# Patient Record
Sex: Female | Born: 1955 | Race: White | Hispanic: No | Marital: Married | Smoking: Current every day smoker
Health system: Southern US, Community
[De-identification: ages and names within clinical notes are randomized; demographics above are authoritative.]

## PROBLEM LIST (undated history)

## (undated) DIAGNOSIS — E785 Hyperlipidemia, unspecified: Secondary | ICD-10-CM

## (undated) DIAGNOSIS — M1990 Unspecified osteoarthritis, unspecified site: Secondary | ICD-10-CM

## (undated) DIAGNOSIS — I700 Atherosclerosis of aorta: Secondary | ICD-10-CM

## (undated) DIAGNOSIS — K589 Irritable bowel syndrome without diarrhea: Secondary | ICD-10-CM

## (undated) DIAGNOSIS — F419 Anxiety disorder, unspecified: Secondary | ICD-10-CM

## (undated) DIAGNOSIS — I10 Essential (primary) hypertension: Secondary | ICD-10-CM

## (undated) HISTORY — DX: Essential (primary) hypertension: I10

## (undated) HISTORY — DX: Atherosclerosis of aorta: I70.0

## (undated) HISTORY — DX: Irritable bowel syndrome, unspecified: K58.9

## (undated) HISTORY — PX: ROTATOR CUFF REPAIR: SHX139

## (undated) HISTORY — DX: Hyperlipidemia, unspecified: E78.5

## (undated) HISTORY — DX: Anxiety disorder, unspecified: F41.9

## (undated) HISTORY — DX: Unspecified osteoarthritis, unspecified site: M19.90

---

## 1998-07-26 ENCOUNTER — Emergency Department (HOSPITAL_COMMUNITY): Admission: EM | Admit: 1998-07-26 | Discharge: 1998-07-26 | Payer: Self-pay | Admitting: Emergency Medicine

## 2000-03-31 ENCOUNTER — Other Ambulatory Visit: Admission: RE | Admit: 2000-03-31 | Discharge: 2000-03-31 | Payer: Self-pay | Admitting: *Deleted

## 2001-06-22 ENCOUNTER — Other Ambulatory Visit: Admission: RE | Admit: 2001-06-22 | Discharge: 2001-06-22 | Payer: Self-pay | Admitting: Family Medicine

## 2003-02-04 HISTORY — PX: COLONOSCOPY WITH ESOPHAGOGASTRODUODENOSCOPY (EGD): SHX5779

## 2003-02-12 ENCOUNTER — Ambulatory Visit (HOSPITAL_COMMUNITY): Admission: RE | Admit: 2003-02-12 | Discharge: 2003-02-12 | Payer: Self-pay | Admitting: Gastroenterology

## 2004-11-16 ENCOUNTER — Other Ambulatory Visit: Admission: RE | Admit: 2004-11-16 | Discharge: 2004-11-16 | Payer: Self-pay | Admitting: Family Medicine

## 2010-07-04 ENCOUNTER — Emergency Department (HOSPITAL_COMMUNITY): Admission: EM | Admit: 2010-07-04 | Discharge: 2010-07-04 | Payer: Self-pay | Admitting: Family Medicine

## 2010-12-06 HISTORY — PX: REPLACEMENT TOTAL KNEE: SUR1224

## 2011-04-23 NOTE — Op Note (Signed)
Sherry Salinas, Sherry Salinas                         ACCOUNT NO.:  [id number]   MEDICAL RECORD NO.:  [id number]                   PATIENT TYPE:  AMB   LOCATION:  ENDO                                 FACILITY:  [facility]   PHYSICIAN:  John C. Madilyn Fireman, M.D.                 DATE OF BIRTH:  05/06/1956   DATE OF PROCEDURE:  02/12/2003  DATE OF DISCHARGE:                                 OPERATIVE REPORT   PROCEDURE:  Esophagogastroduodenoscopy.   INDICATIONS FOR PROCEDURE:  Patient undergoing colonoscopy for heme positive  stools. She has also had a long history of gastroesophageal reflux and uses  nonsteroidal antiinflammatory drugs and we decided to perform an EGD as  well.   DESCRIPTION OF PROCEDURE:  The patient was placed in the left lateral  decubitus position then placed on the pulse monitor with continuous low flow  oxygen delivered by nasal cannula. She was sedated with 75 mcg IV fentanyl  and 6 mg IV Versed. The Olympus video endoscope was advanced under direct  vision into the oropharynx and esophagus. The esophagus was straight and of  normal caliber with the squamocolumnar line at approximately 38 cm. There  were 2 or 3 long erosions without exudate extending up about 2 cm from the  main level of the Z line consistent with esophagitis possibly and evolution  to short segment type of Barrett's esophagus. There were no visible erosions  or exudate and no visible ring or stricture. There did appear to be a  vaguely defined 1 1/2 to 2 cm sliding hiatal hernia and a patulous  gastroesophageal junction. The stomach was entered and a small amount of  liquid secretions were suctioned from the fundus. Retroflexed view of the  cardia confirmed a hiatal hernia and was otherwise unremarkable. The fundus,  body, antrum and pylorus appeared normal. The duodenum was entered and there  was some mild erythema and granularity of the duodenal bulb consistent with  duodenitis. The postbulbar duodenum  appeared normal. A CLOtest was obtained  from the antrum and the scope was withdrawn. The patient returned to the  recovery room in stable condition. The patient tolerated the procedure well  and there were no immediate complications.   IMPRESSION:  1. Grade 3 esophagitis.  2. Hiatal hernia with patulous gastroesophageal junction.  3. Mild duodenitis.   PLAN:  Will suggest a proton pump inhibitor in place of Pepcid if her  symptoms are not adequately controlled and will await CLOtest and treat for  eradication of helicobacter if positive.                                               John C. Madilyn Fireman, M.D.   JCH/MEDQ  D:  02/12/2003  T:  02/12/2003  Job:  [id number]  cc:   S. Kyra Manges, M.D.  [phone] [address]  [place]  [place] [address]  Fax: [phone]

## 2011-04-23 NOTE — Op Note (Signed)
   Sherry Salinas, Sherry Salinas                         ACCOUNT NO.:  [id number]   MEDICAL RECORD NO.:  [id number]                   PATIENT TYPE:  AMB   LOCATION:  ENDO                                 FACILITY:  [facility]   PHYSICIAN:  John C. Madilyn Fireman, M.D.                 DATE OF BIRTH:  25-Jul-1956   DATE OF PROCEDURE:  02/12/2003  DATE OF DISCHARGE:                                 OPERATIVE REPORT   PROCEDURE:  Colonoscopy.   INDICATION FOR PROCEDURE:  Heme-positive stools.   DESCRIPTION OF PROCEDURE:  The patient was placed in the left lateral  decubitus position and placed on the pulse monitor with continuous low-flow  oxygen delivered by nasal cannula.  She was sedated with 25 mcg IV fentanyl  and 4 mg IV Versed in addition to the medicine given for the previous EGD.  The Olympus video colonoscope was inserted into the rectum and advanced to  the cecum, confirmed by transillumination at McBurney's point and  visualization of the ileocecal valve and appendiceal orifice.  The prep was  excellent.  The cecum, ascending, transverse, descending, and sigmoid colon  all appeared normal with no masses, polyps, diverticula, or other mucosal  abnormalities.  The rectum likewise appeared normal, and retroflexed view of  the anus revealed no obvious internal hemorrhoids.  The colonoscope was then  withdrawn, and the patient returned to the recovery room in stable  condition.  She tolerated the procedure well, and there were no immediate  complications.   IMPRESSION:  Basically normal colonoscopy.   PLAN:  Next colon screening by sigmoidoscopy in five years.                                               John C. Madilyn Fireman, M.D.    JCH/MEDQ  D:  02/12/2003  T:  02/12/2003  Job:  [id number]   cc:   S. Kyra Manges, M.D.  [phone] [address]  [place]  [place] [address]  Fax: [phone]

## 2011-07-23 ENCOUNTER — Other Ambulatory Visit (HOSPITAL_COMMUNITY): Payer: Self-pay | Admitting: Orthopedic Surgery

## 2011-07-23 ENCOUNTER — Ambulatory Visit (HOSPITAL_COMMUNITY)
Admission: RE | Admit: 2011-07-23 | Discharge: 2011-07-23 | Disposition: A | Discharge status: Home / Self Care | Payer: BC Managed Care – PPO | Source: Ambulatory Visit | Attending: Orthopedic Surgery | Admitting: Orthopedic Surgery

## 2011-07-23 ENCOUNTER — Encounter (HOSPITAL_COMMUNITY)
Admission: RE | Admit: 2011-07-23 | Discharge: 2011-07-23 | Disposition: A | Discharge status: Home / Self Care | Payer: BC Managed Care – PPO | Source: Ambulatory Visit | Attending: Orthopedic Surgery | Admitting: Orthopedic Surgery

## 2011-07-23 DIAGNOSIS — Z01812 Encounter for preprocedural laboratory examination: Secondary | ICD-10-CM | POA: Insufficient documentation

## 2011-07-23 DIAGNOSIS — Z01811 Encounter for preprocedural respiratory examination: Secondary | ICD-10-CM

## 2011-07-23 DIAGNOSIS — Z01818 Encounter for other preprocedural examination: Secondary | ICD-10-CM | POA: Insufficient documentation

## 2011-07-23 DIAGNOSIS — Z01810 Encounter for preprocedural cardiovascular examination: Secondary | ICD-10-CM | POA: Insufficient documentation

## 2011-07-23 LAB — COMPREHENSIVE METABOLIC PANEL
ALT: 24 U/L (ref 0–35)
Alkaline Phosphatase: 86 U/L (ref 39–117)
BUN: 17 mg/dL (ref 6–23)
Calcium: 10.5 mg/dL (ref 8.4–10.5)
Potassium: 5.4 mEq/L — ABNORMAL HIGH (ref 3.5–5.1)
Total Bilirubin: 0.4 mg/dL (ref 0.3–1.2)
Total Protein: 7.2 g/dL (ref 6.0–8.3)

## 2011-07-23 LAB — CBC
HCT: 41.7 % (ref 36.0–46.0)
Hemoglobin: 14.4 g/dL (ref 12.0–15.0)
MCHC: 34.5 g/dL (ref 30.0–36.0)
RDW: 12.6 % (ref 11.5–15.5)

## 2011-07-23 LAB — SURGICAL PCR SCREEN: Staphylococcus aureus: NEGATIVE

## 2011-07-23 LAB — PROTIME-INR: INR: 1.07 (ref 0.00–1.49)

## 2011-07-23 LAB — TYPE AND SCREEN: Antibody Screen: NEGATIVE

## 2011-07-23 LAB — APTT: aPTT: 32 seconds (ref 24–37)

## 2011-08-04 ENCOUNTER — Inpatient Hospital Stay (HOSPITAL_COMMUNITY)
Admission: RE | Admit: 2011-08-04 | Discharge: 2011-08-08 | DRG: 209 | Disposition: A | Discharge status: Home / Self Care | Payer: BC Managed Care – PPO | Source: Ambulatory Visit | Attending: Orthopedic Surgery | Admitting: Orthopedic Surgery

## 2011-08-04 ENCOUNTER — Inpatient Hospital Stay (HOSPITAL_COMMUNITY): Payer: BC Managed Care – PPO

## 2011-08-04 DIAGNOSIS — Z8709 Personal history of other diseases of the respiratory system: Secondary | ICD-10-CM

## 2011-08-04 DIAGNOSIS — M1710 Unilateral primary osteoarthritis, unspecified knee: Principal | ICD-10-CM | POA: Diagnosis present

## 2011-08-04 DIAGNOSIS — I10 Essential (primary) hypertension: Secondary | ICD-10-CM | POA: Diagnosis present

## 2011-08-04 DIAGNOSIS — F17200 Nicotine dependence, unspecified, uncomplicated: Secondary | ICD-10-CM | POA: Diagnosis present

## 2011-08-05 LAB — CBC
HCT: 29.6 % — ABNORMAL LOW (ref 36.0–46.0)
Hemoglobin: 10.1 g/dL — ABNORMAL LOW (ref 12.0–15.0)
MCHC: 34.1 g/dL (ref 30.0–36.0)
Platelets: 215 10*3/uL (ref 150–400)
RDW: 13 % (ref 11.5–15.5)

## 2011-08-05 LAB — BASIC METABOLIC PANEL
BUN: 14 mg/dL (ref 6–23)
CO2: 25 mEq/L (ref 19–32)
Calcium: 8.9 mg/dL (ref 8.4–10.5)
Chloride: 107 mEq/L (ref 96–112)
Creatinine, Ser: 0.63 mg/dL (ref 0.50–1.10)
Glucose, Bld: 129 mg/dL — ABNORMAL HIGH (ref 70–99)
Potassium: 4 mEq/L (ref 3.5–5.1)

## 2011-08-05 LAB — PROTIME-INR: INR: 1.18 (ref 0.00–1.49)

## 2011-08-06 LAB — CBC
MCH: 32.4 pg (ref 26.0–34.0)
MCHC: 32.9 g/dL (ref 30.0–36.0)
MCV: 98.4 fL (ref 78.0–100.0)
Platelets: 197 10*3/uL (ref 150–400)
RDW: 13.3 % (ref 11.5–15.5)
WBC: 13.3 10*3/uL — ABNORMAL HIGH (ref 4.0–10.5)

## 2011-08-06 LAB — BASIC METABOLIC PANEL
BUN: 12 mg/dL (ref 6–23)
CO2: 29 mEq/L (ref 19–32)
Calcium: 9 mg/dL (ref 8.4–10.5)
Chloride: 101 mEq/L (ref 96–112)
Creatinine, Ser: 0.55 mg/dL (ref 0.50–1.10)
GFR calc Af Amer: >60 mL/min (ref >60)
Potassium: 4 mEq/L (ref 3.5–5.1)
Sodium: 134 mEq/L — ABNORMAL LOW (ref 135–145)

## 2011-08-07 LAB — CBC
HCT: 29.7 % — ABNORMAL LOW (ref 36.0–46.0)
Hemoglobin: 10 g/dL — ABNORMAL LOW (ref 12.0–15.0)
MCH: 32.8 pg (ref 26.0–34.0)
MCHC: 33.7 g/dL (ref 30.0–36.0)
WBC: 10.6 10*3/uL — ABNORMAL HIGH (ref 4.0–10.5)

## 2011-08-07 LAB — BASIC METABOLIC PANEL
CO2: 27 mEq/L (ref 19–32)
Creatinine, Ser: 0.63 mg/dL (ref 0.50–1.10)
Sodium: 139 mEq/L (ref 135–145)

## 2011-08-08 LAB — PROTIME-INR
INR: 2.28 — ABNORMAL HIGH (ref 0.00–1.49)
Prothrombin Time: 25.5 seconds — ABNORMAL HIGH (ref 11.6–15.2)

## 2011-08-09 NOTE — Op Note (Signed)
NAMEINDRA, WOLTERS               ACCOUNT NO.:  [id number]  MEDICAL RECORD NO.:  [id number]  LOCATION:  2899                         FACILITY:  [facility]  PHYSICIAN:  Nadara Mustard, MD     DATE OF BIRTH:  07/27/1956  DATE OF PROCEDURE:  08/04/2011 DATE OF DISCHARGE:                              OPERATIVE REPORT   PREOPERATIVE DIAGNOSIS:  Osteoarthritis right knee with deep retained hardware.  POSTOPERATIVE DIAGNOSIS:  Osteoarthritis right knee with deep retained hardware.  PROCEDURE: 1. Right total knee arthroplasty with Zimmer components, size 3 tibia,     size D femur, 17-mm rotating platform poly with a 29-mm patella. 2. Removal of deep retained hardware. 3. Computer-assisted guidance for the total knee arthroplasty.  SURGEON:  Nadara Mustard, MD  ANESTHESIA:  General plus femoral block plus popliteal block.  ESTIMATED BLOOD LOSS:  Minimal.  ANTIBIOTICS:  Kefzol 1 g.  DRAINS:  None.  COMPLICATIONS:  None.  DISPOSITION:  To PACU in stable condition.  TOURNIQUET TIME:  38 minutes at 300 mmHg.  INDICATIONS FOR PROCEDURE:  The patient is a 55 year old woman who is status post open right knee reconstruction.  She has developed degenerative traumatic arthritis to the right knee.  She has undergone arthroscopy, steroid injections, and Uflex injections.  She has pain with activities of daily living.  She is unable to perform activities of daily living.  She has to use assistance for ambulation and due to failure of prolonged conservative care she presents at this time for total knee arthroplasty.  Risks and benefits of surgery were discussed including infection, neurovascular injury, persistent pain, DVT, pulmonary embolus and need for additional surgery.  The patient states she understands and wished to proceed at this time.  DESCRIPTION OF PROCEDURE:  The patient was brought to OR room 10 after undergoing a femoral block.  She underwent a general anesthetic.   After adequate level of anesthesia obtained, the patient's right lower extremity was prepped using DuraPrep and draped into a sterile field. Her previous curvilinear incision was used and this was extended proximally and distally.  This was carried down to the medial parapatellar retinacular incision.  Visualization showed tricompartmental arthritic changes with loss of articular cartilage in all three compartments.  An osteotome was used to remove the overlying bone and then the deep retained staple was removed on the proximal medial tibia.  Attention was first focused on the femur.  The femur was reamed with an IM guidance where 10 mm taken off the distal femur, 3 degrees of flexion and 6 degrees of valgus.  The cut was made. Attention was then focused on the tibia.  Approximately, 15 mm was taken off the proximal tibia with neutral varus and valgus with a 7-degree posterior slope.  The computer generated tool was then placed on the femoral condyles.  Sequential trials with the polyethylene thickness was tried and with the knee held flexed in 90 degrees of flexion, this was dialed into the external rotation which allowed for equal pressure in the medial and lateral compartments of three.  This was pinned.  The sizing block was placed.  This sized for a size D.  The pin  holes were made for the size D femur and the chamfer cuts were made for the size D femur with the cutting block.  Attention was then focused on the tibia. The tibia was sized for a size 3 and the keel punches were made for the size 3 tibia.  Attention was then focused on the femur.  The femoral trial was placed and the box cut was made for the femur.  This was tried with a 17-mm poly and had a stable alignment.  The trial components were removed.  The patella was resurfaced and 9-mm was taken off the patella and a 29-mm patella button cuts were made.  The popliteal block was performed with 0.25% Marcaine plain with 60 mL  of Marcaine.  Care was taken not to have an intravascular injection.  The knee was irrigated with pulsatile lavage.  The tibial and femoral components were cemented in place and loose cement was removed.  The poly tray was placed and the knee was kept in extension until the cement had hardened.  The knee screw was placed for the poly.  The patella button was clamped in place and all components were left to mobilize until the cement had hardened. All loose cement was removed.  The knee was irrigated again with pulsatile lavage.  The tourniquet was deflated.  Hemostasis was obtained.  The knee tracked midline with no pressure on the patella. The retinacular incision was closed using #1 Vicryl, subcu was closed using 2-0 Vicryl, and the skin was closed using approximating staples. The wound was covered with Adaptic orthopedic sponges, Webril and Coban. The patient was extubated and taken to the PACU in stable condition.     Nadara Mustard, MD     MVD/MEDQ  D:  08/04/2011  T:  08/04/2011  Job:  [id number]  Electronically Signed by Aldean Baker MD on 08/09/2011 06:44:40 AM

## 2011-09-22 NOTE — Discharge Summary (Signed)
  [name], Sherry Salinas               ACCOUNT NO.:  [id number]  MEDICAL RECORD NO.:  [id number]  LOCATION:  5032                         FACILITY:  [facility]  PHYSICIAN:  Nadara Mustard, MD     DATE OF BIRTH:  [birth year]  DATE OF ADMISSION:  08/04/2011 DATE OF DISCHARGE:  08/08/2011                              DISCHARGE SUMMARY   FINAL DIAGNOSIS:  Osteoarthritis, right knee.  SURGICAL PROCEDURE:  Right total knee arthroplasty with Zimmer components with computer-assisted guidance.  Discharged to home in stable condition.  Physical therapy, progressive ambulation, weightbearing as tolerated.  Discharge medications include her admission medications plus Coumadin for DVT prophylaxis and Vicodin and Tylox for pain.  Follow up in the office in 2 weeks.     Nadara Mustard, MD     MVD/MEDQ  D:  08/26/2011  T:  08/26/2011  Job:  [id number]  Electronically Signed by Aldean Baker MD on 09/22/2011 06:11:53 AM

## 2011-09-27 DIAGNOSIS — H6980 Other specified disorders of Eustachian tube, unspecified ear: Secondary | ICD-10-CM | POA: Insufficient documentation

## 2011-09-27 DIAGNOSIS — K219 Gastro-esophageal reflux disease without esophagitis: Secondary | ICD-10-CM | POA: Insufficient documentation

## 2011-09-27 DIAGNOSIS — I10 Essential (primary) hypertension: Secondary | ICD-10-CM | POA: Insufficient documentation

## 2011-09-27 DIAGNOSIS — H6990 Unspecified Eustachian tube disorder, unspecified ear: Secondary | ICD-10-CM | POA: Insufficient documentation

## 2011-11-23 DIAGNOSIS — E785 Hyperlipidemia, unspecified: Secondary | ICD-10-CM | POA: Insufficient documentation

## 2013-10-12 ENCOUNTER — Encounter (INDEPENDENT_AMBULATORY_CARE_PROVIDER_SITE_OTHER): Payer: Managed Care, Other (non HMO) | Admitting: Ophthalmology

## 2013-10-12 DIAGNOSIS — H43819 Vitreous degeneration, unspecified eye: Secondary | ICD-10-CM

## 2013-10-12 DIAGNOSIS — I10 Essential (primary) hypertension: Secondary | ICD-10-CM

## 2013-10-12 DIAGNOSIS — H2510 Age-related nuclear cataract, unspecified eye: Secondary | ICD-10-CM

## 2013-10-12 DIAGNOSIS — H33309 Unspecified retinal break, unspecified eye: Secondary | ICD-10-CM

## 2013-10-12 DIAGNOSIS — H35039 Hypertensive retinopathy, unspecified eye: Secondary | ICD-10-CM

## 2013-10-26 ENCOUNTER — Ambulatory Visit (INDEPENDENT_AMBULATORY_CARE_PROVIDER_SITE_OTHER): Payer: Managed Care, Other (non HMO) | Admitting: Ophthalmology

## 2013-10-26 DIAGNOSIS — H33309 Unspecified retinal break, unspecified eye: Secondary | ICD-10-CM

## 2013-12-06 HISTORY — PX: COLONOSCOPY: SHX174

## 2014-03-08 ENCOUNTER — Ambulatory Visit (INDEPENDENT_AMBULATORY_CARE_PROVIDER_SITE_OTHER): Payer: Managed Care, Other (non HMO) | Admitting: Ophthalmology

## 2014-06-13 ENCOUNTER — Encounter (INDEPENDENT_AMBULATORY_CARE_PROVIDER_SITE_OTHER): Payer: Managed Care, Other (non HMO) | Admitting: Ophthalmology

## 2014-06-27 ENCOUNTER — Encounter (INDEPENDENT_AMBULATORY_CARE_PROVIDER_SITE_OTHER): Payer: 59 | Admitting: Ophthalmology

## 2014-06-27 DIAGNOSIS — I10 Essential (primary) hypertension: Secondary | ICD-10-CM

## 2014-06-27 DIAGNOSIS — H33309 Unspecified retinal break, unspecified eye: Secondary | ICD-10-CM

## 2014-06-27 DIAGNOSIS — H43819 Vitreous degeneration, unspecified eye: Secondary | ICD-10-CM

## 2014-06-27 DIAGNOSIS — H35039 Hypertensive retinopathy, unspecified eye: Secondary | ICD-10-CM

## 2014-06-27 DIAGNOSIS — H2510 Age-related nuclear cataract, unspecified eye: Secondary | ICD-10-CM

## 2016-11-04 DIAGNOSIS — F17210 Nicotine dependence, cigarettes, uncomplicated: Secondary | ICD-10-CM | POA: Insufficient documentation

## 2016-11-04 DIAGNOSIS — K589 Irritable bowel syndrome without diarrhea: Secondary | ICD-10-CM | POA: Insufficient documentation

## 2016-11-04 DIAGNOSIS — G8929 Other chronic pain: Secondary | ICD-10-CM | POA: Insufficient documentation

## 2016-11-04 DIAGNOSIS — M25561 Pain in right knee: Secondary | ICD-10-CM | POA: Insufficient documentation

## 2016-11-04 DIAGNOSIS — T63441A Toxic effect of venom of bees, accidental (unintentional), initial encounter: Secondary | ICD-10-CM | POA: Insufficient documentation

## 2017-01-19 DIAGNOSIS — H0260 Xanthelasma of unspecified eye, unspecified eyelid: Secondary | ICD-10-CM | POA: Insufficient documentation

## 2017-09-14 DIAGNOSIS — F411 Generalized anxiety disorder: Secondary | ICD-10-CM | POA: Insufficient documentation

## 2017-09-14 DIAGNOSIS — E559 Vitamin D deficiency, unspecified: Secondary | ICD-10-CM

## 2017-09-14 DIAGNOSIS — H60542 Acute eczematoid otitis externa, left ear: Secondary | ICD-10-CM | POA: Insufficient documentation

## 2017-09-14 HISTORY — DX: Vitamin D deficiency, unspecified: E55.9

## 2018-01-27 DIAGNOSIS — M25512 Pain in left shoulder: Secondary | ICD-10-CM | POA: Insufficient documentation

## 2018-01-27 DIAGNOSIS — J3089 Other allergic rhinitis: Secondary | ICD-10-CM | POA: Insufficient documentation

## 2018-01-27 DIAGNOSIS — G8929 Other chronic pain: Secondary | ICD-10-CM | POA: Insufficient documentation

## 2018-09-13 DIAGNOSIS — F330 Major depressive disorder, recurrent, mild: Secondary | ICD-10-CM | POA: Insufficient documentation

## 2019-12-15 DIAGNOSIS — K5792 Diverticulitis of intestine, part unspecified, without perforation or abscess without bleeding: Secondary | ICD-10-CM | POA: Diagnosis not present

## 2019-12-15 DIAGNOSIS — R197 Diarrhea, unspecified: Secondary | ICD-10-CM | POA: Diagnosis not present

## 2019-12-15 DIAGNOSIS — N3001 Acute cystitis with hematuria: Secondary | ICD-10-CM | POA: Diagnosis not present

## 2019-12-15 DIAGNOSIS — R300 Dysuria: Secondary | ICD-10-CM | POA: Diagnosis not present

## 2019-12-26 ENCOUNTER — Other Ambulatory Visit: Payer: Self-pay

## 2019-12-28 ENCOUNTER — Encounter: Payer: Self-pay | Admitting: Family Medicine

## 2019-12-28 ENCOUNTER — Ambulatory Visit (INDEPENDENT_AMBULATORY_CARE_PROVIDER_SITE_OTHER): Payer: Medicare HMO | Admitting: Family Medicine

## 2019-12-28 ENCOUNTER — Other Ambulatory Visit: Payer: Self-pay

## 2019-12-28 VITALS — BP 138/85 | HR 77 | Temp 99.3°F | Ht 63.0 in | Wt 139.4 lb

## 2019-12-28 DIAGNOSIS — K5792 Diverticulitis of intestine, part unspecified, without perforation or abscess without bleeding: Secondary | ICD-10-CM

## 2019-12-28 DIAGNOSIS — I10 Essential (primary) hypertension: Secondary | ICD-10-CM

## 2019-12-28 DIAGNOSIS — K219 Gastro-esophageal reflux disease without esophagitis: Secondary | ICD-10-CM | POA: Diagnosis not present

## 2019-12-28 DIAGNOSIS — M25512 Pain in left shoulder: Secondary | ICD-10-CM | POA: Diagnosis not present

## 2019-12-28 DIAGNOSIS — F17210 Nicotine dependence, cigarettes, uncomplicated: Secondary | ICD-10-CM | POA: Diagnosis not present

## 2019-12-28 DIAGNOSIS — M25561 Pain in right knee: Secondary | ICD-10-CM | POA: Diagnosis not present

## 2019-12-28 DIAGNOSIS — J3089 Other allergic rhinitis: Secondary | ICD-10-CM | POA: Diagnosis not present

## 2019-12-28 DIAGNOSIS — F411 Generalized anxiety disorder: Secondary | ICD-10-CM | POA: Diagnosis not present

## 2019-12-28 DIAGNOSIS — Z79899 Other long term (current) drug therapy: Secondary | ICD-10-CM

## 2019-12-28 DIAGNOSIS — G8929 Other chronic pain: Secondary | ICD-10-CM

## 2019-12-28 DIAGNOSIS — Z1211 Encounter for screening for malignant neoplasm of colon: Secondary | ICD-10-CM

## 2019-12-28 DIAGNOSIS — K589 Irritable bowel syndrome without diarrhea: Secondary | ICD-10-CM

## 2019-12-28 DIAGNOSIS — Z0000 Encounter for general adult medical examination without abnormal findings: Secondary | ICD-10-CM

## 2019-12-28 MED ORDER — PANTOPRAZOLE SODIUM 20 MG PO TBEC: 20.0000 mg | DELAYED_RELEASE_TABLET | Freq: Every day | ORAL | 1 refills | Status: DC

## 2019-12-28 MED ORDER — LEVOCETIRIZINE DIHYDROCHLORIDE 5 MG PO TABS: 5.0000 mg | ORAL_TABLET | Freq: Every day | ORAL | 1 refills | Status: DC

## 2019-12-28 MED ORDER — METOPROLOL SUCCINATE ER 50 MG PO TB24: 50.0000 mg | ORAL_TABLET | Freq: Every day | ORAL | 2 refills | Status: DC

## 2019-12-28 MED ORDER — HYDROCHLOROTHIAZIDE 12.5 MG PO CAPS: 12.5000 mg | ORAL_CAPSULE | Freq: Every day | ORAL | 1 refills | Status: DC

## 2019-12-28 MED ORDER — DICYCLOMINE HCL 20 MG PO TABS: 20.0000 mg | ORAL_TABLET | Freq: Four times a day (QID) | ORAL | 1 refills | Status: DC

## 2019-12-28 NOTE — Progress Notes (Signed)
New Patient Office Visit  Assessment & Plan:  1. Benign essential hypertension - Patient to continue hydrochlorothiazide 12.5 mg once daily.  I have changed metoprolol from tartrate to succinate 50 mg once daily. - hydrochlorothiazide (MICROZIDE) 12.5 MG capsule; Take 1 capsule (12.5 mg total) by mouth daily.  Dispense: 90 capsule; Refill: 1 - metoprolol succinate (TOPROL-XL) 50 MG 24 hr tablet; Take 1 tablet (50 mg total) by mouth daily. Take with or immediately following a meal.  Dispense: 30 tablet; Refill: 2  2. Cigarette nicotine dependence without complication - Encouraged smoking cessation.   3. Gastroesophageal reflux disease, unspecified whether esophagitis present - pantoprazole (PROTONIX) 20 MG tablet; Take 1 tablet (20 mg total) by mouth daily.  Dispense: 90 tablet; Refill: 1  4. Non-seasonal allergic rhinitis, unspecified trigger - Well controlled on current regimen.  - levocetirizine (XYZAL) 5 MG tablet; Take 1 tablet (5 mg total) by mouth daily.  Dispense: 90 tablet; Refill: 1  5. GAD (generalized anxiety disorder) - Advised patient that we would either need to wean off of Xanax and get her on something that is not appropriate for long-term management of anxiety or refer her to a psychiatrist.  She is agreeable to weaning off of Xanax.  We completed GeneSight testing today so that we can prescribe a medication she is more likely to respond to the first go around and decrease some of the trial and error involved in treatment.  Advised to decrease from 0.5 mg twice daily down to 0.5 mg once daily.  6. Irritable bowel syndrome without diarrhea - Ambulatory referral to Gastroenterology - dicyclomine (BENTYL) 20 MG tablet; Take 1 tablet (20 mg total) by mouth every 6 (six) hours.  Dispense: 90 tablet; Refill: 1  7. Diverticulitis - Ambulatory referral to Gastroenterology  8-10. Chronic left shoulder pain/Chronic pain of right knee/Controlled substance agreement signed -  Patient signed a controlled substance agreement today and completed UDS.  She will return in 2 weeks so that we can further discuss pain management. - Compliance Drug Analysis, Ur  11. Colon cancer screening - Ambulatory referral to Gastroenterology  12. Healthcare maintenance - Patient declined Shingrix.   Patient will have fasting lab work at her next visit.  Follow-up: Return in about 2 weeks (around 01/11/2020) for pain contract (fasting).   Hendricks Limes, MSN, APRN, FNP-C [facility]  Subjective:  Patient ID: Sherry Salinas, female    DOB: May 28, 1956  Age: 64 y.o. MRN: [id number]  Patient Care Team: Loman Brooklyn, FNP as PCP - General (Family Medicine)  CC:  Chief Complaint  Patient presents with  . Annual Exam    [facility]  . Establish Care  . wellness exam    HPI Sherry Salinas presents to establish care. She is transferring from the [place] office in [place].   Patient reports she has previously seen an ENT who told her that her nose runs to her left ear which causes itching. This is why she takes antihistamines and uses the triamcinolone cream.   Patient has been taking metoprolol tartrate 50 mg once daily but does not feel it is enough for her as she feels like in the evening times her blood pressure is starting to come back up.  Pain: Patient takes tramadol 50 mg at bedtime due to chronic pain of right knee and left shoulder.  She reports her knee bothers her more at night when she tries to lay down.  She has  tried pillows and other things to make her more comfortable which are not very effective.  She is unable to bend her knee more than 90 degrees.  Anxiety: Patient reports she takes Xanax for anxiety issues.  She is currently living in a tiny home with her husband, son and granddaughter.  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  States she has been  taking Xanax for many years, since her early 42s.  Reports she has tried Prozac and Cymbalta in the past both of which caused extreme headaches and diarrhea so she chose not to try a third medication to treat her anxiety.  She is currently taking 0.5 mg twice daily.  Depression screen [facility] 2/9 12/28/2019 12/28/2019  Decreased Interest 1 0  Down, Depressed, Hopeless 0 0  PHQ - 2 Score 1 0  Altered sleeping 1 -  Tired, decreased energy 0 -  Change in appetite 0 -  Feeling bad or failure about yourself  0 -  Trouble concentrating 0 -  Moving slowly or fidgety/restless 0 -  Suicidal thoughts 0 -  PHQ-9 Score 2 -   GAD 7 : Generalized Anxiety Score 12/28/2019  Nervous, Anxious, on Edge 1  Control/stop worrying 1  Worry too much - different things 1  Trouble relaxing 1  Restless 0  Easily annoyed or irritable 1  Afraid - awful might happen 0  Total GAD 7 Score 5    Review of Systems  Constitutional: Negative for chills, fever, malaise/fatigue and weight loss.  HENT: Negative for congestion, ear discharge, ear pain, nosebleeds, sinus pain, sore throat and tinnitus.   Eyes: Negative for blurred vision, double vision, pain, discharge and redness.  Respiratory: Negative for cough, shortness of breath and wheezing.   Cardiovascular: Negative for chest pain, palpitations and leg swelling.  Gastrointestinal: Negative for abdominal pain, constipation, diarrhea, heartburn, nausea and vomiting.  Genitourinary: Negative for dysuria, frequency and urgency.  Musculoskeletal: Positive for joint pain. Negative for myalgias.  Skin: Negative for rash.  Neurological: Negative for dizziness, seizures, weakness and headaches.  Psychiatric/Behavioral: Negative for depression, substance abuse and suicidal ideas. The patient is nervous/anxious.     Current Outpatient Medications:  .  ALPRAZolam (XANAX) 0.25 MG tablet, Take 2 tablets by mouth daily., Disp: , Rfl:  .  ciprofloxacin (CIPRO) 500 MG tablet, Take  1 tablet by mouth 2 (two) times daily., Disp: , Rfl:  .  dicyclomine (BENTYL) 20 MG tablet, Take 1 tablet (20 mg total) by mouth every 6 (six) hours., Disp: 90 tablet, Rfl: 1 .  EPINEPHrine 0.3 mg/0.3 mL IJ SOAJ injection, Inject into the muscle., Disp: , Rfl:  .  hydrochlorothiazide (MICROZIDE) 12.5 MG capsule, Take 1 capsule (12.5 mg total) by mouth daily., Disp: 90 capsule, Rfl: 1 .  levocetirizine (XYZAL) 5 MG tablet, Take 1 tablet (5 mg total) by mouth daily., Disp: 90 tablet, Rfl: 1 .  metroNIDAZOLE (FLAGYL) 500 MG tablet, Take 1 tablet by mouth 3 (three) times daily., Disp: , Rfl:  .  pantoprazole (PROTONIX) 20 MG tablet, Take 1 tablet (20 mg total) by mouth daily., Disp: 90 tablet, Rfl: 1 .  traMADol (ULTRAM) 50 MG tablet, Take 1 tablet by mouth 2 (two) times daily as needed., Disp: , Rfl:  .  triamcinolone cream (KENALOG) 0.1 %, Apply topically 2 (two) times daily as needed., Disp: , Rfl:  .  metoprolol succinate (TOPROL-XL) 50  MG 24 hr tablet, Take 1 tablet (50 mg total) by mouth daily. Take with or immediately following a meal., Disp: 30 tablet, Rfl: 2  Allergies  Allergen Reactions  . Bee Venom Anaphylaxis  . Codeine Anaphylaxis  . Other Other (See Comments)    Muscle aches  . Statins Other (See Comments)    Muscle aches  . Sulfa Antibiotics Rash  . Sulfasalazine Rash    Past Medical History:  Diagnosis Date  . Anxiety   . Arthritis   . Hyperlipidemia   . Hypertension   . Vitamin D deficiency 09/14/2017    Past Surgical History:  Procedure Laterality Date  . NOSE SURGERY    . REPLACEMENT TOTAL KNEE Right 2012  . ROTATOR CUFF REPAIR Right     Family History  Problem Relation Age of Onset  . Arthritis Mother   . Kidney failure Mother   . Rheum arthritis Mother   . Stroke Father   . Heart disease Father   . Heart attack Father   . Stroke Maternal Grandfather   . Early death Paternal Grandmother        Childbirth  . Early death Paternal Grandfather         MVA    Social History   Socioeconomic History  . Marital status: Married    Spouse name: Not on file  . Number of children: Not on file  . Years of education: Not on file  . Highest education level: Not on file  Occupational History  . Not on file  Tobacco Use  . Smoking status: Current Every Day Smoker    Packs/day: 1.00    Types: Cigarettes  . Smokeless tobacco: Never Used  Substance and Sexual Activity  . Alcohol use: Never  . Drug use: Never  . Sexual activity: Not on file  Other Topics Concern  . Not on file  Social History Narrative  . Not on file   Social Determinants of Health   Financial Resource Strain:   . Difficulty of Paying Living Expenses: Not on file  Food Insecurity:   . Worried About Charity fundraiser in the Last Year: Not on file  . Ran Out of Food in the Last Year: Not on file  Transportation Needs:   . Lack of Transportation (Medical): Not on file  . Lack of Transportation (Non-Medical): Not on file  Physical Activity:   . Days of Exercise per Week: Not on file  . Minutes of Exercise per Session: Not on file  Stress:   . Feeling of Stress : Not on file  Social Connections:   . Frequency of Communication with Friends and Family: Not on file  . Frequency of Social Gatherings with Friends and Family: Not on file  . Attends Religious Services: Not on file  . Active Member of Clubs or Organizations: Not on file  . Attends Archivist Meetings: Not on file  . Marital Status: Not on file  Intimate Partner Violence:   . Fear of Current or Ex-Partner: Not on file  . Emotionally Abused: Not on file  . Physically Abused: Not on file  . Sexually Abused: Not on file    Objective:   Today's Vitals: BP 138/85   Pulse 77   Temp 99.3 F (37.4 C) (Temporal)   Ht 5\' 3"  (1.6 m)   Wt 139 lb 6.4 oz (63.2 kg)   SpO2 96%   BMI 24.69 kg/m   Physical Exam Vitals reviewed.  Constitutional:  General: She is not in acute distress.     Appearance: Normal appearance. She is normal weight. She is not ill-appearing, toxic-appearing or diaphoretic.  HENT:     Head: Normocephalic and atraumatic.  Eyes:     General: No scleral icterus.       Right eye: No discharge.        Left eye: No discharge.     Conjunctiva/sclera: Conjunctivae normal.  Cardiovascular:     Rate and Rhythm: Normal rate and regular rhythm.     Heart sounds: Normal heart sounds. No murmur. No friction rub. No gallop.   Pulmonary:     Effort: Pulmonary effort is normal. No respiratory distress.     Breath sounds: Normal breath sounds. No stridor. No wheezing, rhonchi or rales.  Musculoskeletal:     Cervical back: Normal range of motion.     Right knee: Decreased range of motion (unable to flex > 90 degrees).  Skin:    General: Skin is warm and dry.     Capillary Refill: Capillary refill takes less than 2 seconds.  Neurological:     General: No focal deficit present.     Mental Status: She is alert and oriented to person, place, and time. Mental status is at baseline.  Psychiatric:        Mood and Affect: Mood normal.        Behavior: Behavior normal.        Thought Content: Thought content normal.        Judgment: Judgment normal.

## 2019-12-29 LAB — COMPLIANCE DRUG ANALYSIS, UR

## 2020-01-01 ENCOUNTER — Encounter: Payer: Self-pay | Admitting: Gastroenterology

## 2020-01-07 NOTE — Progress Notes (Signed)
Assessment & Plan:  1-3. Controlled substance agreement signed/Chronic left shoulder pain/Chronic pain of right knee - Controlled substance agreement signed. UDS as expected.  - traMADol (ULTRAM) 50 MG tablet; Take 1 tablet (50 mg total) by mouth 2 (two) times daily as needed.  Dispense: 60 tablet; Refill: 2  4. GAD (generalized anxiety disorder) - Patient will continue Xanax 0.25 mg BID for the next couple of weeks. She was then encouraged to try to decrease to 0.25 mg once daily as the Effexor starts to build up in her system.  - venlafaxine XR (EFFEXOR XR) 37.5 MG 24 hr capsule; Take 1 capsule (37.5 mg total) by mouth daily.  Dispense: 30 capsule; Refill: 2  5. Lack of energy - CBC with Differential/Platelet - TSH - VITAMIN D 25 Hydroxy (Vit-D Deficiency, Fractures)  6. Anemia, unspecified type - CBC with Differential/Platelet - Folate  7. Benign essential hypertension - Well controlled on current regimen.  - CMP14+EGFR - Lipid panel  8. Cigarette nicotine dependence without complication - Lipid panel  9. Hyperlipidemia, unspecified hyperlipidemia type - CMP14+EGFR - Lipid panel  10. Healthcare maintenance - Patient does not wish to have further mammograms or pap smears as she would not want to undergo treatment. She worked in [employer] and saw too many sufferings due to chemo and radiation.    Return in about 6 weeks (around 02/22/2020) for anxiety AND 3 months for contract.  Hendricks Limes, MSN, APRN, FNP-C [facility]  Subjective:    Patient ID: Sherry Salinas, female    DOB: 1956/05/26, 64 y.o.   MRN: [id number]  Patient Care Team: Loman Brooklyn, FNP as PCP - General (Family Medicine) Danie Binder, MD as Consulting Physician (Gastroenterology)   Chief Complaint:  Chief Complaint  Patient presents with  . pain managment    2 week follow up    HPI: Sherry Salinas is a 64 y.o. female presenting on 01/11/2020 for pain managment (2  week follow up)  [place] reviewed- Yes If yes- were their any concerning findings : No  Depression screen [facility] 2/9 01/11/2020 12/28/2019 12/28/2019  Decreased Interest 2 1 0  Down, Depressed, Hopeless 2 0 0  PHQ - 2 Score 4 1 0  Altered sleeping 1 1 -  Tired, decreased energy 2 0 -  Change in appetite 0 0 -  Feeling bad or failure about yourself  1 0 -  Trouble concentrating 1 0 -  Moving slowly or fidgety/restless 0 0 -  Suicidal thoughts 0 0 -  PHQ-9 Score 9 2 -  Difficult doing work/chores Somewhat difficult - -   GAD 7 : Generalized Anxiety Score 01/11/2020 12/28/2019  Nervous, Anxious, on Edge 2 1  Control/stop worrying 1 1  Worry too much - different things 1 1  Trouble relaxing 1 1  Restless 0 0  Easily annoyed or irritable 2 1  Afraid - awful might happen 1 0  Total GAD 7 Score 8 5  Anxiety Difficulty Somewhat difficult -   [employer] drug screen performed- Yes - as expected.  SOAPP  0= never  1= seldom  2=sometimes  3= often  4= very often  How often do you have mood swings? 1 How often do you smoke a cigarette within an hour after waking up? 4 How often have you taken medication other than the way that it was prescribed? 0 How often have you used illegal drugs in the past 5 years?  0 How often, in your lifetime, have you had legal problems or been arrested? 0  Score 5  Alcohol Audit - How often during the last year have found that you: 0-Never   1- Less than monthly   2- Monthly     3-Weekly     4-daily or almost daily  - found that you were not able to stop drinking once you started- 0 - failed to do what was normally expected of you because of drinking- 0 - needed a first drink in the morning- 0 - had a feeling of guilt or remorse after drinking- 0 - are/were unable to remember what happened the night before because of your drinking- 0  0- NO   2- yes but not in last year  4- yes during last year - Have you or someone  else been injured because of your drinking- 0 - Has anyone been concerned about your drinking or suggested you cut down- 0        TOTAL- 0  ( 0-7- alcohol education, 8-15- simple advice, 16-19 simple advice plus counseling, 20-40 referral for evaluation and treatment )  Designated Pharmacy- [facility]  Pain assessment: Cause of pain- arthritis, right knee injury with surgery, left shoulder injury falling off horse Pain location- right knee, left shoulder, hands/joints Pain on scale of 1-10- 6-7/10 at night when trying to get comfortable Frequency- daily What increases pain- lying down What makes pain better- using it, Tramadol  Effects on ADL - unable to do things she use too  Prior treatments tried and failed- Advil, Tylenol, heating pad, muscle rubs Current opioids rx- Tramadol 50 mg BID PRN # prescribed- 60 Morphine mg equivalent- 10 MME/day  Pain management agreement reviewed and signed- Yes   New complaints: Patient is currently taking Xanax 0.25 mg twice daily which is still 0.5 mg per day.  Patient has concerns regarding lack of energy on a daily basis.   Social history:  Relevant past medical, surgical, family and social history reviewed and updated as indicated. Interim medical history since our last visit reviewed.  Allergies and medications reviewed and updated.  DATA REVIEWED: CHART IN EPIC  ROS: Negative unless specifically indicated above in HPI.    Current Outpatient Medications:  .  ALPRAZolam (XANAX) 0.25 MG tablet, Take 2 tablets by mouth daily., Disp: , Rfl:  .  dicyclomine (BENTYL) 20 MG tablet, Take 1 tablet (20 mg total) by mouth every 6 (six) hours., Disp: 90 tablet, Rfl: 1 .  EPINEPHrine 0.3 mg/0.3 mL IJ SOAJ injection, Inject into the muscle., Disp: , Rfl:  .  hydrochlorothiazide (MICROZIDE) 12.5 MG capsule, Take 1 capsule (12.5 mg total) by mouth daily., Disp: 90 capsule, Rfl: 1 .  levocetirizine (XYZAL) 5 MG tablet, Take 1 tablet (5 mg total)  by mouth daily., Disp: 90 tablet, Rfl: 1 .  metoprolol succinate (TOPROL-XL) 50 MG 24 hr tablet, Take 1 tablet (50 mg total) by mouth daily. Take with or immediately following a meal., Disp: 30 tablet, Rfl: 2 .  pantoprazole (PROTONIX) 20 MG tablet, Take 1 tablet (20 mg total) by mouth daily., Disp: 90 tablet, Rfl: 1 .  traMADol (ULTRAM) 50 MG tablet, Take 1 tablet (50 mg total) by mouth 2 (two) times daily as needed., Disp: 60 tablet, Rfl: 2 .  triamcinolone cream (KENALOG) 0.1 %, Apply topically 2 (two) times daily as needed., Disp: , Rfl:  .  venlafaxine XR (EFFEXOR XR) 37.5 MG 24 hr capsule, Take 1 capsule (37.5  mg total) by mouth daily., Disp: 30 capsule, Rfl: 2   Allergies  Allergen Reactions  . Bee Venom Anaphylaxis  . Codeine Anaphylaxis  . Other Other (See Comments)    Muscle aches  . Statins Other (See Comments)    Muscle aches  . Sulfa Antibiotics Rash  . Sulfasalazine Rash   Past Medical History:  Diagnosis Date  . Anxiety   . Arthritis   . Hyperlipidemia   . Hypertension   . Vitamin D deficiency 09/14/2017    Past Surgical History:  Procedure Laterality Date  . NOSE SURGERY    . REPLACEMENT TOTAL KNEE Right 2012  . ROTATOR CUFF REPAIR Right     Social History   Socioeconomic History  . Marital status: Married    Spouse name: Not on file  . Number of children: Not on file  . Years of education: Not on file  . Highest education level: Not on file  Occupational History  . Not on file  Tobacco Use  . Smoking status: Current Every Day Smoker    Packs/day: 1.00    Types: Cigarettes  . Smokeless tobacco: Never Used  Substance and Sexual Activity  . Alcohol use: Never  . Drug use: Never  . Sexual activity: Not on file  Other Topics Concern  . Not on file  Social History Narrative  . Not on file   Social Determinants of Health   Financial Resource Strain:   . Difficulty of Paying Living Expenses: Not on file  Food Insecurity:   . Worried About Paediatric nurse in the Last Year: Not on file  . Ran Out of Food in the Last Year: Not on file  Transportation Needs:   . Lack of Transportation (Medical): Not on file  . Lack of Transportation (Non-Medical): Not on file  Physical Activity:   . Days of Exercise per Week: Not on file  . Minutes of Exercise per Session: Not on file  Stress:   . Feeling of Stress : Not on file  Social Connections:   . Frequency of Communication with Friends and Family: Not on file  . Frequency of Social Gatherings with Friends and Family: Not on file  . Attends Religious Services: Not on file  . Active Member of Clubs or Organizations: Not on file  . Attends Archivist Meetings: Not on file  . Marital Status: Not on file  Intimate Partner Violence:   . Fear of Current or Ex-Partner: Not on file  . Emotionally Abused: Not on file  . Physically Abused: Not on file  . Sexually Abused: Not on file        Objective:    BP 132/77   Pulse 71   Temp (!) 97.5 F (36.4 C) (Temporal)   Ht '5\' 3"'$  (1.6 m)   Wt 139 lb (63 kg)   SpO2 100%   BMI 24.62 kg/m   Physical Exam  No results found for: TSH Lab Results  Component Value Date   WBC 10.6 (H) 08/07/2011   HGB 10.0 (L) 08/07/2011   HCT 29.7 (L) 08/07/2011   MCV 97.4 08/07/2011   PLT 211 08/07/2011   Lab Results  Component Value Date   NA 139 08/07/2011   K 3.9 08/07/2011   CO2 27 08/07/2011   GLUCOSE 96 08/07/2011   BUN 11 08/07/2011   CREATININE 0.63 08/07/2011   BILITOT 0.4 07/23/2011   ALKPHOS 86 07/23/2011   AST 20 07/23/2011   ALT  24 07/23/2011   PROT 7.2 07/23/2011   ALBUMIN 4.2 07/23/2011   CALCIUM 9.3 08/07/2011   No results found for: CHOL No results found for: HDL No results found for: LDLCALC No results found for: TRIG No results found for: CHOLHDL No results found for: HGBA1C

## 2020-01-10 ENCOUNTER — Other Ambulatory Visit: Payer: Self-pay

## 2020-01-11 ENCOUNTER — Ambulatory Visit (INDEPENDENT_AMBULATORY_CARE_PROVIDER_SITE_OTHER): Payer: Medicare HMO | Admitting: Family Medicine

## 2020-01-11 ENCOUNTER — Encounter: Payer: Self-pay | Admitting: Family Medicine

## 2020-01-11 ENCOUNTER — Encounter: Payer: Medicare HMO | Admitting: Family Medicine

## 2020-01-11 VITALS — BP 132/77 | HR 71 | Temp 97.5°F | Ht 63.0 in | Wt 139.0 lb

## 2020-01-11 DIAGNOSIS — E785 Hyperlipidemia, unspecified: Secondary | ICD-10-CM | POA: Diagnosis not present

## 2020-01-11 DIAGNOSIS — F17210 Nicotine dependence, cigarettes, uncomplicated: Secondary | ICD-10-CM

## 2020-01-11 DIAGNOSIS — M25512 Pain in left shoulder: Secondary | ICD-10-CM

## 2020-01-11 DIAGNOSIS — D649 Anemia, unspecified: Secondary | ICD-10-CM

## 2020-01-11 DIAGNOSIS — Z79899 Other long term (current) drug therapy: Secondary | ICD-10-CM

## 2020-01-11 DIAGNOSIS — F411 Generalized anxiety disorder: Secondary | ICD-10-CM

## 2020-01-11 DIAGNOSIS — M25561 Pain in right knee: Secondary | ICD-10-CM | POA: Diagnosis not present

## 2020-01-11 DIAGNOSIS — Z0000 Encounter for general adult medical examination without abnormal findings: Secondary | ICD-10-CM

## 2020-01-11 DIAGNOSIS — R5383 Other fatigue: Secondary | ICD-10-CM | POA: Diagnosis not present

## 2020-01-11 DIAGNOSIS — I10 Essential (primary) hypertension: Secondary | ICD-10-CM | POA: Diagnosis not present

## 2020-01-11 DIAGNOSIS — G8929 Other chronic pain: Secondary | ICD-10-CM

## 2020-01-11 MED ORDER — VENLAFAXINE HCL ER 37.5 MG PO CP24: 37.5000 mg | ORAL_CAPSULE | Freq: Every day | ORAL | 2 refills | Status: DC

## 2020-01-11 MED ORDER — TRAMADOL HCL 50 MG PO TABS: 50.0000 mg | ORAL_TABLET | Freq: Two times a day (BID) | ORAL | 2 refills | Status: DC | PRN

## 2020-01-12 LAB — CBC WITH DIFFERENTIAL/PLATELET
Basophils Absolute: 0 10*3/uL (ref 0.0–0.2)
Basos: 1 %
EOS (ABSOLUTE): 0.1 10*3/uL (ref 0.0–0.4)
Eos: 1 %
Hematocrit: 46.1 % (ref 34.0–46.6)
Hemoglobin: 15.7 g/dL (ref 11.1–15.9)
Immature Grans (Abs): 0 10*3/uL (ref 0.0–0.1)
Immature Granulocytes: 1 %
Lymphocytes Absolute: 2.5 10*3/uL (ref 0.7–3.1)
Lymphs: 29 %
MCH: 33.3 pg — ABNORMAL HIGH (ref 26.6–33.0)
MCHC: 34.1 g/dL (ref 31.5–35.7)
MCV: 98 fL — ABNORMAL HIGH (ref 79–97)
Monocytes Absolute: 1 10*3/uL — ABNORMAL HIGH (ref 0.1–0.9)
Monocytes: 11 %
Neutrophils Absolute: 5.2 10*3/uL (ref 1.4–7.0)
Neutrophils: 57 %
Platelets: 239 10*3/uL (ref 150–450)
RBC: 4.71 x10E6/uL (ref 3.77–5.28)
RDW: 11.5 % — ABNORMAL LOW (ref 11.7–15.4)
WBC: 8.9 10*3/uL (ref 3.4–10.8)

## 2020-01-12 LAB — CMP14+EGFR
ALT: 20 IU/L (ref 0–32)
AST: 20 IU/L (ref 0–40)
Albumin/Globulin Ratio: 1.5 (ref 1.2–2.2)
Albumin: 4.3 g/dL (ref 3.8–4.8)
Alkaline Phosphatase: 85 IU/L (ref 39–117)
BUN/Creatinine Ratio: 14 (ref 12–28)
BUN: 12 mg/dL (ref 8–27)
Bilirubin Total: 0.4 mg/dL (ref 0.0–1.2)
CO2: 22 mmol/L (ref 20–29)
Calcium: 10.2 mg/dL (ref 8.7–10.3)
Chloride: 104 mmol/L (ref 96–106)
Creatinine, Ser: 0.87 mg/dL (ref 0.57–1.00)
GFR calc Af Amer: 81 mL/min/{1.73_m2} (ref >59)
GFR calc non Af Amer: 71 mL/min/{1.73_m2} (ref >59)
Globulin, Total: 2.8 g/dL (ref 1.5–4.5)
Glucose: 98 mg/dL (ref 65–99)
Potassium: 4.6 mmol/L (ref 3.5–5.2)
Sodium: 141 mmol/L (ref 134–144)
Total Protein: 7.1 g/dL (ref 6.0–8.5)

## 2020-01-12 LAB — LIPID PANEL
Chol/HDL Ratio: 5.2 ratio — ABNORMAL HIGH (ref 0.0–4.4)
Cholesterol, Total: 241 mg/dL — ABNORMAL HIGH (ref 100–199)
HDL: 46 mg/dL (ref >39)
LDL Chol Calc (NIH): 150 mg/dL — ABNORMAL HIGH (ref 0–99)
Triglycerides: 244 mg/dL — ABNORMAL HIGH (ref 0–149)
VLDL Cholesterol Cal: 45 mg/dL — ABNORMAL HIGH (ref 5–40)

## 2020-01-12 LAB — TSH: TSH: 1.99 u[IU]/mL (ref 0.450–4.500)

## 2020-01-12 LAB — VITAMIN D 25 HYDROXY (VIT D DEFICIENCY, FRACTURES): Vit D, 25-Hydroxy: 38.5 ng/mL (ref 30.0–100.0)

## 2020-01-12 LAB — FOLATE: Folate: 5.5 ng/mL (ref >3.0)

## 2020-01-13 ENCOUNTER — Other Ambulatory Visit: Payer: Self-pay | Admitting: Family Medicine

## 2020-01-13 DIAGNOSIS — E782 Mixed hyperlipidemia: Secondary | ICD-10-CM

## 2020-01-13 MED ORDER — EZETIMIBE 10 MG PO TABS: 10.0000 mg | ORAL_TABLET | Freq: Every day | ORAL | 2 refills | Status: DC

## 2020-01-17 ENCOUNTER — Encounter: Payer: Self-pay | Admitting: Gastroenterology

## 2020-01-17 NOTE — Progress Notes (Signed)
Primary Care Physician:  Loman Brooklyn, FNP  Primary Gastroenterologist:  Garfield Cornea, MD   Chief Complaint  Patient presents with  . Irritable Bowel Syndrome    Diverticulitis,just finished metronidazole and cipro,was seen [facility]    HPI:  Sherry Salinas is a 64 y.o. female here at the request of Hendricks Limes, FNP for further evaluation of IBS, diverticulitis, colon cancer screening.   Patient states she was just treated for 1st episode of presumptive diverticulitis. Over christmas she began eating nuts every day. Described eating excessive amounts. She developed acute onset explosive diarrhea and LLQ pain (different than her baseline IBS), felt like she had the flu, no fever. Went to urgent care in [place] on 12/15/19. Urine culture negative. Covid test negative. She was given Cipro/Flagy for suspected diverticulitis. She was also having dysuria at that time. She says after about a week of antibiotics her symptoms settled down quite a bit, not completely 100% though. She states she took about 8 days of antibiotics. Couple of days after stopping them her symptoms returned so she completed the last two days and was doing a lot better. Now off abx for since 12/27/19.   She continues to have daily LLQ pain. Some days worse than others. Stools not explosive but are loose. No melena, brbpr. No fever. No vomiting. Seems to be worse with stress.  Symptoms different than her typical IBS. LLQ pain not as severe as when this started but difficult to continue with daily pain.   She has been on bentyl for 20 years. Taking several times a day especially right now trying to help with the pain. Denies constipation. Reflux well controlled.   Patient she had her last colonoscopy around 2015 with Dr. Amedeo Plenty.  She states she had diverticulosis, 3 polyps removed and advised to come back in 5 years.  Those records are not available to me at this time.   EGD March 2004 by Dr. Amedeo Plenty: Grade 3  esophagitis, hiatal hernia with patulous GE junction, mild duodenitis.  Clotest results unavailable.  Colonoscopy March 2004 by Dr. Amedeo Plenty: Normal.      Current Outpatient Medications  Medication Sig Dispense Refill  . ALPRAZolam (XANAX) 0.25 MG tablet Take 2 tablets by mouth daily. Takes 0.5 mg twice daily    . dicyclomine (BENTYL) 20 MG tablet Take 1 tablet (20 mg total) by mouth every 6 (six) hours. 90 tablet 1  . EPINEPHrine 0.3 mg/0.3 mL IJ SOAJ injection Inject into the muscle.    . hydrochlorothiazide (MICROZIDE) 12.5 MG capsule Take 1 capsule (12.5 mg total) by mouth daily. 90 capsule 1  . levocetirizine (XYZAL) 5 MG tablet Take 1 tablet (5 mg total) by mouth daily. 90 tablet 1  . metoprolol succinate (TOPROL-XL) 50 MG 24 hr tablet Take 1 tablet (50 mg total) by mouth daily. Take with or immediately following a meal. 30 tablet 2  . pantoprazole (PROTONIX) 20 MG tablet Take 1 tablet (20 mg total) by mouth daily. 90 tablet 1  . traMADol (ULTRAM) 50 MG tablet Take 1 tablet (50 mg total) by mouth 2 (two) times daily as needed. 60 tablet 2  . triamcinolone cream (KENALOG) 0.1 % Apply topically 2 (two) times daily as needed.     No current facility-administered medications for this visit.    Allergies as of 01/18/2020 - Review Complete 01/18/2020  Allergen Reaction Noted  . Bee venom Anaphylaxis 07/31/2013  . Codeine Anaphylaxis 09/24/2011  . Other Other (See Comments)  07/19/2019  . Statins Other (See Comments) 07/19/2019  . Sulfa antibiotics Rash 05/14/2013  . Sulfasalazine Rash 05/14/2013    Past Medical History:  Diagnosis Date  . Anxiety   . Arthritis   . Hyperlipidemia   . Hypertension   . IBS (irritable bowel syndrome)   . Vitamin D deficiency 09/14/2017    Past Surgical History:  Procedure Laterality Date  . COLONOSCOPY  2015   Dr. Amedeo Plenty: per pt, diverticulosis, 3 polyps, come back in five years.   . COLONOSCOPY WITH ESOPHAGOGASTRODUODENOSCOPY (EGD)  02/2003   Dr.  Amedeo Plenty: Grade 3 esophagitis, hiatal hernia with patulous GE junction, mild duodenitis.  CLOtest, results unavailable.  Colonoscopy normal.  . REPLACEMENT TOTAL KNEE Right 2012  . ROTATOR CUFF REPAIR Right     Family History  Problem Relation Age of Onset  . Arthritis Mother   . Kidney failure Mother   . Rheum arthritis Mother   . Stroke Father   . Heart disease Father   . Heart attack Father   . Stroke Maternal Grandfather   . Early death Paternal Grandmother        Childbirth  . Early death Paternal Grandfather        MVA  . Colon cancer Paternal Uncle     Social History   Socioeconomic History  . Marital status: Married    Spouse name: Not on file  . Number of children: Not on file  . Years of education: Not on file  . Highest education level: Not on file  Occupational History  . Not on file  Tobacco Use  . Smoking status: Current Every Day Smoker    Packs/day: 1.00    Types: Cigarettes  . Smokeless tobacco: Never Used  Substance and Sexual Activity  . Alcohol use: Never  . Drug use: Never  . Sexual activity: Not on file  Other Topics Concern  . Not on file  Social History Narrative  . Not on file   Social Determinants of Health   Financial Resource Strain:   . Difficulty of Paying Living Expenses: Not on file  Food Insecurity:   . Worried About Charity fundraiser in the Last Year: Not on file  . Ran Out of Food in the Last Year: Not on file  Transportation Needs:   . Lack of Transportation (Medical): Not on file  . Lack of Transportation (Non-Medical): Not on file  Physical Activity:   . Days of Exercise per Week: Not on file  . Minutes of Exercise per Session: Not on file  Stress:   . Feeling of Stress : Not on file  Social Connections:   . Frequency of Communication with Friends and Family: Not on file  . Frequency of Social Gatherings with Friends and Family: Not on file  . Attends Religious Services: Not on file  . Active Member of Clubs or  Organizations: Not on file  . Attends Archivist Meetings: Not on file  . Marital Status: Not on file  Intimate Partner Violence:   . Fear of Current or Ex-Partner: Not on file  . Emotionally Abused: Not on file  . Physically Abused: Not on file  . Sexually Abused: Not on file      ROS:  General: Negative for anorexia, weight loss, fever, chills, fatigue, weakness.  See HPI Eyes: Negative for vision changes.  ENT: Negative for hoarseness, difficulty swallowing , nasal congestion. CV: Negative for chest pain, angina, palpitations, dyspnea on exertion, peripheral edema.  Respiratory: Negative for dyspnea at rest, dyspnea on exertion, cough, sputum, wheezing.  GI: See history of present illness. GU:  Negative for dysuria, hematuria, urinary incontinence, urinary frequency, nocturnal urination.  MS: Negative for joint pain, low back pain.  Derm: Negative for rash or itching.  Neuro: Negative for weakness, abnormal sensation, seizure, frequent headaches, memory loss, confusion.  Psych: Negative for anxiety, depression, suicidal ideation, hallucinations.  Endo: Negative for unusual weight change.  Heme: Negative for bruising or bleeding. Allergy: Negative for rash or hives.    Physical Examination:  BP (!) 155/83   Pulse 67   Temp (!) 96.9 F (36.1 C) (Temporal)   Ht 5\' 3"  (1.6 m)   Wt 139 lb 12.8 oz (63.4 kg)   BMI 24.76 kg/m    General: Well-nourished, well-developed in no acute distress.  Head: Normocephalic, atraumatic.   Eyes: Conjunctiva pink, no icterus. Mouth: masked Neck: Supple without thyromegaly, masses, or lymphadenopathy.  Lungs: Clear to auscultation bilaterally.  Heart: Regular rate and rhythm, no murmurs rubs or gallops.  Abdomen: Bowel sounds are normal,   nondistended, no hepatosplenomegaly or masses, no abdominal bruits or    hernia , no rebound or guarding.  Moderate left lower quadrant tenderness Rectal: not performed Extremities: No lower  extremity edema. No clubbing or deformities.  Neuro: Alert and oriented x 4 , grossly normal neurologically.  Skin: Warm and dry, no rash or jaundice.   Psych: Alert and cooperative, normal mood and affect.  Labs: Lab Results  Component Value Date   TSH 1.990 01/11/2020   Lab Results  Component Value Date   WBC 8.9 01/11/2020   HGB 15.7 01/11/2020   HCT 46.1 01/11/2020   MCV 98 (H) 01/11/2020   PLT 239 01/11/2020   Lab Results  Component Value Date   FOLATE 5.5 01/11/2020   Lab Results  Component Value Date   ALT 20 01/11/2020   AST 20 01/11/2020   ALKPHOS 85 01/11/2020   BILITOT 0.4 01/11/2020   Lab Results  Component Value Date   CREATININE 0.87 01/11/2020   BUN 12 01/11/2020   NA 141 01/11/2020   K 4.6 01/11/2020   CL 104 01/11/2020   CO2 22 01/11/2020     Imaging Studies: No results found.

## 2020-01-18 ENCOUNTER — Ambulatory Visit: Payer: Medicare HMO | Admitting: Gastroenterology

## 2020-01-18 ENCOUNTER — Encounter: Payer: Self-pay | Admitting: Gastroenterology

## 2020-01-18 ENCOUNTER — Other Ambulatory Visit: Payer: Self-pay

## 2020-01-18 VITALS — BP 155/83 | HR 67 | Temp 96.9°F | Ht 63.0 in | Wt 139.8 lb

## 2020-01-18 DIAGNOSIS — K580 Irritable bowel syndrome with diarrhea: Secondary | ICD-10-CM | POA: Diagnosis not present

## 2020-01-18 DIAGNOSIS — R1032 Left lower quadrant pain: Secondary | ICD-10-CM | POA: Diagnosis not present

## 2020-01-18 DIAGNOSIS — K5732 Diverticulitis of large intestine without perforation or abscess without bleeding: Secondary | ICD-10-CM | POA: Diagnosis not present

## 2020-01-18 DIAGNOSIS — Z86010 Personal history of colonic polyps: Secondary | ICD-10-CM | POA: Insufficient documentation

## 2020-01-18 DIAGNOSIS — K589 Irritable bowel syndrome without diarrhea: Secondary | ICD-10-CM | POA: Insufficient documentation

## 2020-01-18 MED ORDER — AMOXICILLIN-POT CLAVULANATE 875-125 MG PO TABS: 1.0000 | ORAL_TABLET | Freq: Two times a day (BID) | ORAL | 0 refills | Status: AC

## 2020-01-18 NOTE — Assessment & Plan Note (Addendum)
Pleasant 64 year old female with IBS-D, history of colon polyps presenting for further evaluation of recent episode of diverticulitis and update colon cancer screening.  Patient seen in urgent care early part of January with acute onset left lower quadrant pain and explosive diarrhea.  Symptoms different than previous IBS.  Chronically on Bentyl for 20 years.  Had noted significant/excessive ingestion of nuts over the holidays which she contributed to her symptoms with known history of diverticulosis.  Patient was treated empirically for diverticulitis, Cipro and Flagyl provided.  Symptoms did improve but off antibiotics have returned.  Moderate tenderness on exam.  Diarrhea has improved.  Worried about smoldering diverticulitis or complicated diverticulitis.  Recommend CT imaging for further evaluation.  We are trying to obtain prior authorization for CT scan as well as patient is unavailable to go today for personal reasons, so her scan would not be done until first of the week.  We will go ahead and start Augmentin 875/125 mg twice daily for 10 days.  Clear liquid diet to soft diet.  If her symptoms worsen other weekend she should go to the emergency department.  Otherwise we will follow up with her after her CT scan next week.  Once her diverticulitis has been cleared, we will schedule her colonoscopy.  Patient voiced understanding.

## 2020-01-18 NOTE — Patient Instructions (Signed)
1. Start Augmentin one tablet twice daily with food for 10 days.  2. Consume liquid to soft diet with low fiber while on treatment for diverticulitis.  3. CT scan as scheduled. We will be in touch with results.    Diverticulitis  Diverticulitis is infection or inflammation of small pouches (diverticula) in the colon that form due to a condition called diverticulosis. Diverticula can trap stool (feces) and bacteria, causing infection and inflammation. Diverticulitis may cause severe stomach pain and diarrhea. It may lead to tissue damage in the colon that causes bleeding. The diverticula may also burst (rupture) and cause infected stool to enter other areas of the abdomen. Complications of diverticulitis can include:  Bleeding.  Severe infection.  Severe pain.  Rupture (perforation) of the colon.  Blockage (obstruction) of the colon. What are the causes? This condition is caused by stool becoming trapped in the diverticula, which allows bacteria to grow in the diverticula. This leads to inflammation and infection. What increases the risk? You are more likely to develop this condition if:  You have diverticulosis. The risk for diverticulosis increases if: ? You are overweight or obese. ? You use tobacco products. ? You do not get enough exercise.  You eat a diet that does not include enough fiber. High-fiber foods include fruits, vegetables, beans, nuts, and whole grains. What are the signs or symptoms? Symptoms of this condition may include:  Pain and tenderness in the abdomen. The pain is normally located on the left side of the abdomen, but it may occur in other areas.  Fever and chills.  Bloating.  Cramping.  Nausea.  Vomiting.  Changes in bowel routines.  Blood in your stool. How is this diagnosed? This condition is diagnosed based on:  Your medical history.  A physical exam.  Tests to make sure there is nothing else causing your condition. These tests may  include: ? Blood tests. ? Urine tests. ? Imaging tests of the abdomen, including X-rays, ultrasounds, MRIs, or CT scans. How is this treated? Most cases of this condition are mild and can be treated at home. Treatment may include:  Taking over-the-counter pain medicines.  Following a clear liquid diet.  Taking antibiotic medicines by mouth.  Rest. More severe cases may need to be treated at a hospital. Treatment may include:  Not eating or drinking.  Taking prescription pain medicine.  Receiving antibiotic medicines through an IV tube.  Receiving fluids and nutrition through an IV tube.  Surgery. When your condition is under control, your health care provider may recommend that you have a colonoscopy. This is an exam to look at the entire large intestine. During the exam, a lubricated, bendable tube is inserted into the anus and then passed into the rectum, colon, and other parts of the large intestine. A colonoscopy can show how severe your diverticula are and whether something else may be causing your symptoms. Follow these instructions at home: Medicines  Take over-the-counter and prescription medicines only as told by your health care provider. These include fiber supplements, probiotics, and stool softeners.  If you were prescribed an antibiotic medicine, take it as told by your health care provider. Do not stop taking the antibiotic even if you start to feel better.  Do not drive or use heavy machinery while taking prescription pain medicine. General instructions   Follow a full liquid diet or another diet as directed by your health care provider. After your symptoms improve, your health care provider may tell you to change  your diet. He or she may recommend that you eat a diet that contains at least 25 g (25 grams) of fiber daily. Fiber makes it easier to pass stool. Healthy sources of fiber include: ? Berries. One cup contains 4-8 grams of fiber. ? Beans or lentils. One  half cup contains 5-8 grams of fiber. ? Green vegetables. One cup contains 4 grams of fiber.  Exercise for at least 30 minutes, 3 times each week. You should exercise hard enough to raise your heart rate and break a sweat.  Keep all follow-up visits as told by your health care provider. This is important. You may need a colonoscopy. Contact a health care provider if:  Your pain does not improve.  You have a hard time drinking or eating food.  Your bowel movements do not return to normal. Get help right away if:  Your pain gets worse.  Your symptoms do not get better with treatment.  Your symptoms suddenly get worse.  You have a fever.  You vomit more than one time.  You have stools that are bloody, black, or tarry. Summary  Diverticulitis is infection or inflammation of small pouches (diverticula) in the colon that form due to a condition called diverticulosis. Diverticula can trap stool (feces) and bacteria, causing infection and inflammation.  You are at higher risk for this condition if you have diverticulosis and you eat a diet that does not include enough fiber.  Most cases of this condition are mild and can be treated at home. More severe cases may need to be treated at a hospital.  When your condition is under control, your health care provider may recommend that you have an exam called a colonoscopy. This exam can show how severe your diverticula are and whether something else may be causing your symptoms. This information is not intended to replace advice given to you by your health care provider. Make sure you discuss any questions you have with your health care provider. Document Revised: 11/04/2017 Document Reviewed: 12/25/2016 Elsevier Patient Education  2020 [employer].

## 2020-01-21 ENCOUNTER — Other Ambulatory Visit: Payer: Self-pay

## 2020-01-21 ENCOUNTER — Ambulatory Visit (HOSPITAL_COMMUNITY)
Admission: RE | Admit: 2020-01-21 | Discharge: 2020-01-21 | Disposition: A | Discharge status: Home / Self Care | Payer: Medicare HMO | Source: Ambulatory Visit | Attending: Gastroenterology | Admitting: Gastroenterology

## 2020-01-21 DIAGNOSIS — R1032 Left lower quadrant pain: Secondary | ICD-10-CM | POA: Diagnosis not present

## 2020-01-21 DIAGNOSIS — R109 Unspecified abdominal pain: Secondary | ICD-10-CM | POA: Diagnosis not present

## 2020-01-21 DIAGNOSIS — R197 Diarrhea, unspecified: Secondary | ICD-10-CM | POA: Diagnosis not present

## 2020-01-21 MED ORDER — IOHEXOL 300 MG/ML  SOLN
100.0000 mL | Freq: Once | INTRAMUSCULAR | Status: AC | PRN
  Administered 2020-01-21: 17:00:00 100 mL via INTRAVENOUS

## 2020-01-23 ENCOUNTER — Telehealth: Payer: Self-pay | Admitting: Internal Medicine

## 2020-01-23 NOTE — Telephone Encounter (Signed)
Spoke with pt. Pt is aware that we will contact her when results are available.

## 2020-01-23 NOTE — Telephone Encounter (Signed)
Pt had CT done on 2/15 and was asking if her results were back. I told her it could take up to 7-10 business days, but I would let the nurse be aware she called. [phone]

## 2020-01-28 ENCOUNTER — Encounter: Payer: Self-pay | Admitting: Internal Medicine

## 2020-01-28 NOTE — Progress Notes (Signed)
PATIENT SCHEDULED AND ON RECALL  °

## 2020-01-29 ENCOUNTER — Other Ambulatory Visit: Payer: Self-pay

## 2020-01-29 MED ORDER — PEG 3350-KCL-NA BICARB-NACL 420 G PO SOLR: 4000.0000 mL | ORAL | 0 refills | Status: DC

## 2020-02-04 ENCOUNTER — Telehealth: Payer: Self-pay | Admitting: *Deleted

## 2020-02-04 NOTE — Telephone Encounter (Signed)
PA approved via [facility] website. Auth# [id number] dates 03/17/2020-04/16/2020

## 2020-02-05 ENCOUNTER — Other Ambulatory Visit: Payer: Self-pay | Admitting: Gastroenterology

## 2020-02-05 MED ORDER — LUBIPROSTONE 8 MCG PO CAPS: 8.0000 ug | ORAL_CAPSULE | Freq: Two times a day (BID) | ORAL | 3 refills | Status: DC

## 2020-02-22 ENCOUNTER — Ambulatory Visit (INDEPENDENT_AMBULATORY_CARE_PROVIDER_SITE_OTHER): Payer: Medicare HMO | Admitting: Family Medicine

## 2020-02-22 ENCOUNTER — Other Ambulatory Visit: Payer: Self-pay

## 2020-02-22 ENCOUNTER — Encounter: Payer: Self-pay | Admitting: Family Medicine

## 2020-02-22 VITALS — BP 132/70 | HR 72 | Temp 98.0°F | Ht 63.0 in | Wt 140.0 lb

## 2020-02-22 DIAGNOSIS — K589 Irritable bowel syndrome without diarrhea: Secondary | ICD-10-CM | POA: Diagnosis not present

## 2020-02-22 DIAGNOSIS — K5792 Diverticulitis of intestine, part unspecified, without perforation or abscess without bleeding: Secondary | ICD-10-CM

## 2020-02-22 DIAGNOSIS — N898 Other specified noninflammatory disorders of vagina: Secondary | ICD-10-CM

## 2020-02-22 DIAGNOSIS — B3731 Acute candidiasis of vulva and vagina: Secondary | ICD-10-CM

## 2020-02-22 DIAGNOSIS — B373 Candidiasis of vulva and vagina: Secondary | ICD-10-CM | POA: Diagnosis not present

## 2020-02-22 DIAGNOSIS — R350 Frequency of micturition: Secondary | ICD-10-CM | POA: Diagnosis not present

## 2020-02-22 DIAGNOSIS — F411 Generalized anxiety disorder: Secondary | ICD-10-CM

## 2020-02-22 LAB — MICROSCOPIC EXAMINATION: Renal Epithel, UA: NONE SEEN /hpf

## 2020-02-22 LAB — URINALYSIS, COMPLETE
Bilirubin, UA: NEGATIVE
Glucose, UA: NEGATIVE
Ketones, UA: NEGATIVE
Nitrite, UA: NEGATIVE
Protein,UA: NEGATIVE
Specific Gravity, UA: 1.015 (ref 1.005–1.030)
Urobilinogen, Ur: 0.2 mg/dL (ref 0.2–1.0)
pH, UA: 5.5 (ref 5.0–7.5)

## 2020-02-22 LAB — WET PREP FOR TRICH, YEAST, CLUE
Clue Cell Exam: NEGATIVE
Trichomonas Exam: NEGATIVE
Yeast Exam: POSITIVE — AB

## 2020-02-22 MED ORDER — FLUCONAZOLE 150 MG PO TABS: 150.0000 mg | ORAL_TABLET | Freq: Once | ORAL | 0 refills | Status: AC

## 2020-02-22 MED ORDER — CIPROFLOXACIN HCL 500 MG PO TABS: 500.0000 mg | ORAL_TABLET | Freq: Two times a day (BID) | ORAL | 0 refills | Status: AC

## 2020-02-22 MED ORDER — BUSPIRONE HCL 7.5 MG PO TABS: 7.5000 mg | ORAL_TABLET | Freq: Two times a day (BID) | ORAL | 2 refills | Status: DC

## 2020-02-22 MED ORDER — METRONIDAZOLE 500 MG PO TABS: 500.0000 mg | ORAL_TABLET | Freq: Three times a day (TID) | ORAL | 0 refills | Status: AC

## 2020-02-22 NOTE — Progress Notes (Signed)
Assessment & Plan:  1. GAD (generalized anxiety disorder) - Discussed with patient again why I would not be prescribing Xanax. Offered referral to psychiatrist, which she declined. D/C'd Effexor and started patient on buspirone today.  - busPIRone (BUSPAR) 7.5 MG tablet; Take 1 tablet (7.5 mg total) by mouth 2 (two) times daily.  Dispense: 60 tablet; Refill: 2  2. Diverticulitis - ciprofloxacin (CIPRO) 500 MG tablet; Take 1 tablet (500 mg total) by mouth 2 (two) times daily for 7 days.  Dispense: 14 tablet; Refill: 0 - metroNIDAZOLE (FLAGYL) 500 MG tablet; Take 1 tablet (500 mg total) by mouth 3 (three) times daily for 7 days.  Dispense: 21 tablet; Refill: 0  3. Irritable bowel syndrome without diarrhea - Encouraged patient to call gastroenterologist and let them know she could not afford the Amitiza that was prescribed so they can prescribe something else.   4. Vaginal yeast infection - fluconazole (DIFLUCAN) 150 MG tablet; Take 1 tablet (150 mg total) by mouth once for 1 dose. May repeat after 3 days if needed.  Dispense: 2 tablet; Refill: 0  5. Urinary frequency - Urinalysis, Complete. Urine dipstick shows positive for RBC's and positive for leukocytes.  Micro exam: 6-10 WBC's per HPF, 3-10 RBC's per HPF and few bacteria.  6. Vagina itching - WET PREP FOR TRICH, YEAST, CLUE - positive for yeast.    Return in about 6 weeks (around 04/04/2020) for anxiety/pain contract.  Sherry Limes, MSN, APRN, FNP-C [facility]  Subjective:    Patient ID: Sherry Salinas, female    DOB: February 15, 1956, 64 y.o.   MRN: [id number]  Patient Care Team: Loman Brooklyn, FNP as PCP - General (Family Medicine) Gala Romney Cristopher Estimable, MD as Consulting Physician (Gastroenterology)   Chief Complaint:  Chief Complaint  Patient presents with  . Anxiety    6 week follow up- Patient states that she is not doing good due to being taken off her meds.    HPI: Sherry Salinas is a 64 y.o.  female presenting on 02/22/2020 for Anxiety (6 week follow up- Patient states that she is not doing good due to being taken off her meds.)  Patient is here for follow-up of anxiety.  She has previously failed therapy with Prozac and Cymbalta.  She was weaned off of Xanax that she was taking for long-term management of anxiety.  She was started on Effexor approximately 6 weeks ago.  Patient reports she only took the Effexor for 7 to 10 days and then stopped it as she felt her head and body were separated.  She feels she needs to go back on her Xanax because she is "a better person on it".  Depression screen [facility] 2/9 02/23/2020 01/11/2020 12/28/2019  Decreased Interest 3 2 1   Down, Depressed, Hopeless 3 2 0  PHQ - 2 Score 6 4 1   Altered sleeping 2 1 1   Tired, decreased energy 3 2 0  Change in appetite 0 0 0  Feeling bad or failure about yourself  2 1 0  Trouble concentrating 2 1 0  Moving slowly or fidgety/restless 0 0 0  Suicidal thoughts 0 0 0  PHQ-9 Score 15 9 2   Difficult doing work/chores Somewhat difficult Somewhat difficult -   GAD 7 : Generalized Anxiety Score 02/23/2020 01/11/2020 12/28/2019  Nervous, Anxious, on Edge 3 2 1   Control/stop worrying 3 1 1   Worry too much - different things 3 1 1   Trouble relaxing 3 1 1  Restless 3 0 0  Easily annoyed or irritable 3 2 1   Afraid - awful might happen 3 1 0  Total GAD 7 Score 21 8 5   Anxiety Difficulty Somewhat difficult Somewhat difficult -    New complaints: Patient complains of a burning abdominal pain in the left lower quadrant with the accompanying feeling that she has a blockage at least once a day.  She feels what ever is going on with her bowels is also blocking her bladder, making it difficult for her to completely empty it.  She reports urinary frequency, sometimes going as often as every 15 minutes.  She does have nausea but denies vomiting.  States these are the same symptoms she gets when she is having a diverticulitis flare.   Patient also reports she was given a medication by gastroenterology to replace the Bentyl, but she was unable to afford it.   Social history:  Relevant past medical, surgical, family and social history reviewed and updated as indicated. Interim medical history since our last visit reviewed.  Allergies and medications reviewed and updated.  DATA REVIEWED: CHART IN EPIC  ROS: Negative unless specifically indicated above in HPI.    Current Outpatient Medications:  .  EPINEPHrine 0.3 mg/0.3 mL IJ SOAJ injection, Inject into the muscle., Disp: , Rfl:  .  ezetimibe (ZETIA) 10 MG tablet, Take 1 tablet by mouth daily., Disp: , Rfl:  .  hydrochlorothiazide (MICROZIDE) 12.5 MG capsule, Take 1 capsule (12.5 mg total) by mouth daily., Disp: 90 capsule, Rfl: 1 .  levocetirizine (XYZAL) 5 MG tablet, Take 1 tablet (5 mg total) by mouth daily., Disp: 90 tablet, Rfl: 1 .  lubiprostone (AMITIZA) 8 MCG capsule, Take 1 capsule (8 mcg total) by mouth 2 (two) times daily with a meal., Disp: 60 capsule, Rfl: 3 .  metoprolol succinate (TOPROL-XL) 50 MG 24 hr tablet, Take 1 tablet (50 mg total) by mouth daily. Take with or immediately following a meal., Disp: 30 tablet, Rfl: 2 .  pantoprazole (PROTONIX) 20 MG tablet, Take 1 tablet (20 mg total) by mouth daily., Disp: 90 tablet, Rfl: 1 .  traMADol (ULTRAM) 50 MG tablet, Take 1 tablet (50 mg total) by mouth 2 (two) times daily as needed., Disp: 60 tablet, Rfl: 2 .  triamcinolone cream (KENALOG) 0.1 %, Apply topically 2 (two) times daily as needed., Disp: , Rfl:  .  busPIRone (BUSPAR) 7.5 MG tablet, Take 1 tablet (7.5 mg total) by mouth 2 (two) times daily., Disp: 60 tablet, Rfl: 2 .  ciprofloxacin (CIPRO) 500 MG tablet, Take 1 tablet (500 mg total) by mouth 2 (two) times daily for 7 days., Disp: 14 tablet, Rfl: 0 .  metroNIDAZOLE (FLAGYL) 500 MG tablet, Take 1 tablet (500 mg total) by mouth 3 (three) times daily for 7 days., Disp: 21 tablet, Rfl: 0 .  polyethylene  glycol-electrolytes (TRILYTE) 420 g solution, Take 4,000 mLs by mouth as directed. (Patient not taking: Reported on 02/22/2020), Disp: 4000 mL, Rfl: 0   Allergies  Allergen Reactions  . Bee Venom Anaphylaxis  . Codeine Anaphylaxis  . Other Other (See Comments)    Muscle aches  . Statins Other (See Comments)    Muscle aches  . Sulfa Antibiotics Rash  . Sulfasalazine Rash   Past Medical History:  Diagnosis Date  . Anxiety   . Arthritis   . Hyperlipidemia   . Hypertension   . IBS (irritable bowel syndrome)   . Vitamin D deficiency 09/14/2017    Past  Surgical History:  Procedure Laterality Date  . COLONOSCOPY  2015   Dr. Amedeo Plenty: per pt, diverticulosis, 3 polyps, come back in five years.   . COLONOSCOPY WITH ESOPHAGOGASTRODUODENOSCOPY (EGD)  02/2003   Dr. Amedeo Plenty: Grade 3 esophagitis, hiatal hernia with patulous GE junction, mild duodenitis.  CLOtest, results unavailable.  Colonoscopy normal.  . REPLACEMENT TOTAL KNEE Right 2012  . ROTATOR CUFF REPAIR Right     Social History   Socioeconomic History  . Marital status: Married    Spouse name: Not on file  . Number of children: Not on file  . Years of education: Not on file  . Highest education level: Not on file  Occupational History  . Not on file  Tobacco Use  . Smoking status: Current Every Day Smoker    Packs/day: 1.00    Types: Cigarettes  . Smokeless tobacco: Never Used  Substance and Sexual Activity  . Alcohol use: Never  . Drug use: Never  . Sexual activity: Not on file  Other Topics Concern  . Not on file  Social History Narrative  . Not on file   Social Determinants of Health   Financial Resource Strain:   . Difficulty of Paying Living Expenses:   Food Insecurity:   . Worried About Charity fundraiser in the Last Year:   . Arboriculturist in the Last Year:   Transportation Needs:   . Film/video editor (Medical):   Marland Kitchen Lack of Transportation (Non-Medical):   Physical Activity:   . Days of Exercise  per Week:   . Minutes of Exercise per Session:   Stress:   . Feeling of Stress :   Social Connections:   . Frequency of Communication with Friends and Family:   . Frequency of Social Gatherings with Friends and Family:   . Attends Religious Services:   . Active Member of Clubs or Organizations:   . Attends Archivist Meetings:   Marland Kitchen Marital Status:   Intimate Partner Violence:   . Fear of Current or Ex-Partner:   . Emotionally Abused:   Marland Kitchen Physically Abused:   . Sexually Abused:         Objective:    BP 132/70   Pulse 72   Temp 98 F (36.7 C) (Temporal)   Ht 5\' 3"  (1.6 m)   Wt 140 lb (63.5 kg)   SpO2 99%   BMI 24.80 kg/m   Wt Readings from Last 3 Encounters:  02/22/20 140 lb (63.5 kg)  01/18/20 139 lb 12.8 oz (63.4 kg)  01/11/20 139 lb (63 kg)    Physical Exam Vitals reviewed.  Constitutional:      General: She is not in acute distress.    Appearance: Normal appearance. She is normal weight. She is not ill-appearing, toxic-appearing or diaphoretic.  HENT:     Head: Normocephalic and atraumatic.  Eyes:     General: No scleral icterus.       Right eye: No discharge.        Left eye: No discharge.     Conjunctiva/sclera: Conjunctivae normal.  Cardiovascular:     Rate and Rhythm: Normal rate and regular rhythm.     Heart sounds: Normal heart sounds. No murmur. No friction rub. No gallop.   Pulmonary:     Effort: Pulmonary effort is normal. No respiratory distress.     Breath sounds: Normal breath sounds. No stridor. No wheezing, rhonchi or rales.  Abdominal:     General: Abdomen  is flat. Bowel sounds are normal. There is no distension or abdominal bruit. There are no signs of injury.     Palpations: Abdomen is soft. There is no mass.     Tenderness: There is abdominal tenderness in the left lower quadrant.  Musculoskeletal:        General: Normal range of motion.     Cervical back: Normal range of motion.  Skin:    General: Skin is warm and dry.      Capillary Refill: Capillary refill takes less than 2 seconds.  Neurological:     General: No focal deficit present.     Mental Status: She is alert and oriented to person, place, and time. Mental status is at baseline.  Psychiatric:        Mood and Affect: Mood normal.        Behavior: Behavior normal.        Thought Content: Thought content normal.        Judgment: Judgment normal.     Lab Results  Component Value Date   TSH 1.990 01/11/2020   Lab Results  Component Value Date   WBC 8.9 01/11/2020   HGB 15.7 01/11/2020   HCT 46.1 01/11/2020   MCV 98 (H) 01/11/2020   PLT 239 01/11/2020   Lab Results  Component Value Date   NA 141 01/11/2020   K 4.6 01/11/2020   CO2 22 01/11/2020   GLUCOSE 98 01/11/2020   BUN 12 01/11/2020   CREATININE 0.87 01/11/2020   BILITOT 0.4 01/11/2020   ALKPHOS 85 01/11/2020   AST 20 01/11/2020   ALT 20 01/11/2020   PROT 7.1 01/11/2020   ALBUMIN 4.3 01/11/2020   CALCIUM 10.2 01/11/2020   Lab Results  Component Value Date   CHOL 241 (H) 01/11/2020   Lab Results  Component Value Date   HDL 46 01/11/2020   Lab Results  Component Value Date   LDLCALC 150 (H) 01/11/2020   Lab Results  Component Value Date   TRIG 244 (H) 01/11/2020   Lab Results  Component Value Date   CHOLHDL 5.2 (H) 01/11/2020   No results found for: HGBA1C

## 2020-02-23 ENCOUNTER — Encounter: Payer: Self-pay | Admitting: Family Medicine

## 2020-02-25 ENCOUNTER — Telehealth: Payer: Self-pay | Admitting: Internal Medicine

## 2020-02-25 NOTE — Telephone Encounter (Signed)
Noted. Pt is scheduled with AB tomorrow 02/26/20 at 3:30 pm.

## 2020-02-25 NOTE — Telephone Encounter (Signed)
We need to have patient come back in for face to face in the next 24 hours.

## 2020-02-25 NOTE — Telephone Encounter (Signed)
Pt said she was still having problems with her diverticulitis and went to see her PCP on Friday. She was put on antibiotics and was told to follow up with [place]. Patient is scheduled procedure with [facility] on 4/12. Please advise. [phone]

## 2020-02-25 NOTE — Telephone Encounter (Signed)
Spoke with pt. Pt was seen on Friday by her PCP and was having another Diverticulitis flare. Pt was placed on Cipro and Flagyl on Friday 02/22/20. Pt reports nausea and hasn't taken her Zofran yet. Pts abdomen is were tender and she can't currently button her pants due to the tenderness and pain. Pt is concerned about her upcoming procedure 03/17/20 since this has been pt 3rd flare of diverticulitis. Recent flare was 1/9, 2/21 and 3/19.

## 2020-02-26 ENCOUNTER — Encounter: Payer: Self-pay | Admitting: Gastroenterology

## 2020-02-26 ENCOUNTER — Ambulatory Visit: Payer: Medicare HMO | Admitting: Gastroenterology

## 2020-02-26 ENCOUNTER — Other Ambulatory Visit: Payer: Self-pay

## 2020-02-26 ENCOUNTER — Telehealth: Payer: Self-pay | Admitting: Family Medicine

## 2020-02-26 VITALS — BP 119/74 | HR 76 | Temp 97.5°F | Ht 63.0 in | Wt 139.8 lb

## 2020-02-26 DIAGNOSIS — R1013 Epigastric pain: Secondary | ICD-10-CM | POA: Insufficient documentation

## 2020-02-26 DIAGNOSIS — Z86010 Personal history of colonic polyps: Secondary | ICD-10-CM

## 2020-02-26 DIAGNOSIS — R339 Retention of urine, unspecified: Secondary | ICD-10-CM

## 2020-02-26 MED ORDER — PANTOPRAZOLE SODIUM 40 MG PO TBEC: 40.0000 mg | DELAYED_RELEASE_TABLET | Freq: Every day | ORAL | 3 refills | Status: DC

## 2020-02-26 NOTE — Telephone Encounter (Signed)
I sent Diflucan 150 mg with 2 tablets on 02/22/2020 to [facility].

## 2020-02-26 NOTE — Telephone Encounter (Signed)
She had trace leukocytes and RBC. Not likely that she has UTI but if she does the Cipro will treat it.

## 2020-02-26 NOTE — Telephone Encounter (Signed)
Pt is requesting urine results

## 2020-02-26 NOTE — Progress Notes (Signed)
Referring Provider: Loman Brooklyn, FNP Primary Care Physician:  Loman Brooklyn, FNP Primary GI: Dr. Gala Romney   Chief Complaint  Patient presents with  . Diverticulitis    started Cipro/Flagyl 02/22/20    HPI:   Sherry Salinas is a 64 y.o. female presenting today with a history of LLQ pain presumed to be diverticulitis, with colonoscopy scheduled in the next month. She returns today due to concerns for recurrent diverticulitis. Long-standing history of IBS and personal history of colon polyps in 2015 by Dr. Amedeo Plenty (path unavailable). CT completed when initially seen for consultation in Feb 2021 due to concern for smoldering diverticulitis. This was negative for acute findings.   Patient reports first episode of LLQ pain Jan 9th, treated with Cipro and Flagyl empirically by PCP due to concern for diverticulitis. No imaging at that time. She had just mild improvement and then was seen by this practice in Feb, whereby CT was completed but without findings for diverticulitis. Due to concern for smoldering infection, she completed a course of Augmentin.   She returns today stating she has not been functional since Jan 2021 when pain first started. She was also started again on Cipro/Flagyl recently by PCP, nearing the end of the course for presumed diverticulitis. No significant improvement.   She now has pain "dead center" abdomen, with extreme nausea and worsening pain if lifting or bending over. No fever or chills. Having hard, small BMS. States sometimes it is difficult to urinate with the pain but better now. Lower back is hurting. Saw a residue of blood yesterday on tissue. PCP aware of urinary issues. Checking UA.   When completing antibiotics, she will slowly have recurrent of pain until peaking again. No pain with eating. Wakes her up from sleep. Feels more constipated. She is taking Bentyl again for pain but is constipated. Amitiza was 95$ copay.   Feels like her abdomen is burning  just above umbilicus. No NSAIDs. She is worried about an ulcer as well.   Feels like quality of life is affected.   Currently taking Protonix 20 mg daily.   Past Medical History:  Diagnosis Date  . Anxiety   . Arthritis   . Hyperlipidemia   . Hypertension   . IBS (irritable bowel syndrome)   . Vitamin D deficiency 09/14/2017    Past Surgical History:  Procedure Laterality Date  . COLONOSCOPY  2015   Dr. Amedeo Plenty: per pt, diverticulosis, 3 polyps, come back in five years.   . COLONOSCOPY WITH ESOPHAGOGASTRODUODENOSCOPY (EGD)  02/2003   Dr. Amedeo Plenty: Grade 3 esophagitis, hiatal hernia with patulous GE junction, mild duodenitis.  CLOtest, results unavailable.  Colonoscopy normal.  . REPLACEMENT TOTAL KNEE Right 2012  . ROTATOR CUFF REPAIR Right     Current Outpatient Medications  Medication Sig Dispense Refill  . busPIRone (BUSPAR) 7.5 MG tablet Take 1 tablet (7.5 mg total) by mouth 2 (two) times daily. 60 tablet 2  . ciprofloxacin (CIPRO) 500 MG tablet Take 1 tablet (500 mg total) by mouth 2 (two) times daily for 7 days. 14 tablet 0  . dicyclomine (BENTYL) 20 MG tablet Take 20 mg by mouth 3 (three) times daily.     Marland Kitchen EPINEPHrine 0.3 mg/0.3 mL IJ SOAJ injection Inject into the muscle.    . ezetimibe (ZETIA) 10 MG tablet Take 1 tablet by mouth daily.    . hydrochlorothiazide (MICROZIDE) 12.5 MG capsule Take 1 capsule (12.5 mg total) by mouth daily. 90 capsule 1  .  levocetirizine (XYZAL) 5 MG tablet Take 1 tablet (5 mg total) by mouth daily. 90 tablet 1  . metoprolol succinate (TOPROL-XL) 50 MG 24 hr tablet Take 1 tablet (50 mg total) by mouth daily. Take with or immediately following a meal. 30 tablet 2  . metroNIDAZOLE (FLAGYL) 500 MG tablet Take 1 tablet (500 mg total) by mouth 3 (three) times daily for 7 days. 21 tablet 0  . ondansetron (ZOFRAN) 4 MG tablet Take 4 mg by mouth every 8 (eight) hours as needed for nausea or vomiting.    . traMADol (ULTRAM) 50 MG tablet Take 1 tablet (50  mg total) by mouth 2 (two) times daily as needed. 60 tablet 2  . triamcinolone cream (KENALOG) 0.1 % Apply topically 2 (two) times daily as needed.    . pantoprazole (PROTONIX) 40 MG tablet Take 1 tablet (40 mg total) by mouth daily. 30 minutes before breakfast daily 30 tablet 3   No current facility-administered medications for this visit.    Allergies as of 02/26/2020 - Review Complete 02/26/2020  Allergen Reaction Noted  . Bee venom Anaphylaxis 07/31/2013  . Codeine Anaphylaxis 09/24/2011  . Other Other (See Comments) 07/19/2019  . Statins Other (See Comments) 07/19/2019  . Sulfa antibiotics Rash 05/14/2013  . Sulfasalazine Rash 05/14/2013    Family History  Problem Relation Age of Onset  . Arthritis Mother   . Kidney failure Mother   . Rheum arthritis Mother   . Stroke Father   . Heart disease Father   . Heart attack Father   . Stroke Maternal Grandfather   . Early death Paternal Grandmother        Childbirth  . Early death Paternal Grandfather        MVA  . Colon cancer Paternal Uncle     Social History   Socioeconomic History  . Marital status: Married    Spouse name: Not on file  . Number of children: Not on file  . Years of education: Not on file  . Highest education level: Not on file  Occupational History  . Not on file  Tobacco Use  . Smoking status: Current Every Day Smoker    Packs/day: 1.00    Types: Cigarettes  . Smokeless tobacco: Never Used  Substance and Sexual Activity  . Alcohol use: Never  . Drug use: Never  . Sexual activity: Not on file  Other Topics Concern  . Not on file  Social History Narrative  . Not on file   Social Determinants of Health   Financial Resource Strain:   . Difficulty of Paying Living Expenses:   Food Insecurity:   . Worried About Charity fundraiser in the Last Year:   . Arboriculturist in the Last Year:   Transportation Needs:   . Film/video editor (Medical):   Marland Kitchen Lack of Transportation (Non-Medical):     Physical Activity:   . Days of Exercise per Week:   . Minutes of Exercise per Session:   Stress:   . Feeling of Stress :   Social Connections:   . Frequency of Communication with Friends and Family:   . Frequency of Social Gatherings with Friends and Family:   . Attends Religious Services:   . Active Member of Clubs or Organizations:   . Attends Archivist Meetings:   Marland Kitchen Marital Status:     Review of Systems: Gen: Denies fever, chills, anorexia. Denies fatigue, weakness, weight loss.  CV: Denies chest pain,  palpitations, syncope, peripheral edema, and claudication. Resp: Denies dyspnea at rest, cough, wheezing, coughing up blood, and pleurisy. GI: see HPI Derm: Denies rash, itching, dry skin Psych: Denies depression, anxiety, memory loss, confusion. No homicidal or suicidal ideation.  Heme: see HPI  Physical Exam: BP 119/74   Pulse 76   Temp (!) 97.5 F (36.4 C) (Temporal)   Ht 5\' 3"  (1.6 m)   Wt 139 lb 12.8 oz (63.4 kg)   BMI 24.76 kg/m  General:   Alert and oriented. No distress noted. Pleasant and cooperative.  Head:  Normocephalic and atraumatic. Eyes:  Conjuctiva clear without scleral icterus. Mouth:  Mask in place Abdomen:  +BS, soft, TTP epigastric moderately and mildly LLQ TTP and non-distended. No rebound or guarding. No HSM or masses noted. Msk:  Symmetrical without gross deformities. Normal posture. Extremities:  Without edema. Neurologic:  Alert and  oriented x4 Psych:  Alert and cooperative. Normal mood and affect.  Lab Results  Component Value Date   WBC 8.9 01/11/2020   HGB 15.7 01/11/2020   HCT 46.1 01/11/2020   MCV 98 (H) 01/11/2020   PLT 239 01/11/2020   Lab Results  Component Value Date   ALT 20 01/11/2020   AST 20 01/11/2020   ALKPHOS 85 01/11/2020   BILITOT 0.4 01/11/2020   Lab Results  Component Value Date   CREATININE 0.87 01/11/2020   BUN 12 01/11/2020   NA 141 01/11/2020   K 4.6 01/11/2020   CL 104 01/11/2020   CO2 22  01/11/2020     ASSESSMENT: Sherry Salinas is a 64 y.o. female presenting today with a history of LLQ abdominal pain starting in Jan 2021 and empirically treated by PCP for presumed diverticulitis, with CT in Feb 2021 with diverticulosis but no diverticulitis and treated empirically for possible smoldering diverticulitis, now presenting on her third round of antibiotics for diverticulitis (most recent by PCP). She is also now noting upper abdominal pain and nausea, which is new and worsening.   With history of colon polyps (path unknown) in 2015, she had already been scheduled for a colonoscopy upcoming. She is declining repeat CT at this point, and I suspect it would be low yield at this time. I am not convinced she ever had diverticulitis although symptoms are classic, and she could very well have a low-grade smoldering diverticulitis. However, she also reports constipation, and this could very well be playing a role. Interestingly, she does feel very slight improvement with antibiotics although never resolved.   Now with epigastric pain and nausea, denying NSAIDs, with known history of esophagitis (last EGD by Dr. Amedeo Plenty in 2004). She is concerned about possible ulcer disease. Denies any NSAID use and is on Protonix 20 mg.   We discussed more aggressive management of constipation, increasing PPI to 40 mg Protonix daily, and adding EGD at time of colonoscopy upcoming due to new onset dyspepsia. Labs have been unrevealing. Gallbladder present but no obvious stones on CT. Symptoms not typical for biliary etiology.   If she does not have improvement in her symptoms with increasing PPI, adding Linzess 145 mcg, stopping Bentyl (causing constipation), then I recommend CT before elective colonoscopy.    PLAN:   Proceed with TCS/EGD with Dr. Gala Romney in near future: the risks, benefits, and alternatives have been discussed with the patient in detail. The patient states understanding and desires to proceed.  Propofol   Protonix 40 mg daily sent to pharmacy  Linzess 145 mcg samples provided  Low  fiber/soft diet for now  Call if no improvement next week in symptoms and recommend repeat imaging  Further recommendations to follow   Annitta Needs, PhD, ANP-BC [facility]

## 2020-02-26 NOTE — Telephone Encounter (Signed)
She also wants to know if you can give her something for yeast

## 2020-02-26 NOTE — Patient Instructions (Addendum)
We are adding an upper endoscopy to the colonoscopy.  I have increased Protonix to take in the morning, 30 minutes before breakfast.  Let's stop Bentyl. Start Linzess 1 capsule 30 minutes before breakfast. You may have some loose stool with this, but it should get better. If not, please call us!  Probiotics you may take include: Align, Restora, Digestive Advantage, [facility], [facility] brand.  Please call if pain worsens!  I have included a low fiber (soft diet) to follow for now.  It was a pleasure to see you today. I want to create trusting relationships with patients to provide genuine, compassionate, and quality care. I value your feedback. If you receive a survey regarding your visit,  I greatly appreciate you taking time to fill this out.   Annitta Needs, PhD, ANP-BC [facility]    Low-Fiber Eating Plan Fiber is found in fruits, vegetables, whole grains, and beans. Eating a diet low in fiber helps to reduce how often you have bowel movements and how much you produce during a bowel movement. A low-fiber eating plan may help your digestive system heal if:  You have certain conditions, such as Crohn's disease or diverticulitis.  You recently had radiation therapy on your pelvis or bowel.  You recently had intestinal surgery.  You have a new surgical opening in your abdomen (colostomy or ileostomy).  Your intestine is narrowed (stricture). Your health care provider will determine how long you need to stay on this diet. Your health care provider may recommend that you work with a diet and nutrition specialist (dietitian). What are tips for following this plan? General guidelines  Follow recommendations from your dietitian about how much fiber you should have each day.  Most people on this eating plan should try to eat less than 10 grams (g) of fiber each day. Your daily fiber goal is _________________ g.  Take vitamin and mineral supplements as told  by your health care provider or dietitian. Chewable or liquid forms are best when on this eating plan. Reading food labels  Check food labels for the amount of dietary fiber.  Choose foods that have less than 2 grams of fiber in one serving. Cooking  Use white flour and other allowed grains for baking and cooking.  Cook meat using methods that keep it tender, such as braising or poaching.  Cook eggs until the yolk is completely solid.  Cook with healthy oils, such as olive oil or canola oil. Meal planning   Eat 5-6 small meals throughout the day instead of 3 large meals.  If you are lactose intolerant: ? Choose low-lactose dairy foods. ? Do not eat dairy foods, if told by your dietitian.  Limit fat and oils to less than 8 teaspoons a day.  Eat small portions of desserts. What foods are allowed? The items listed below may not be a complete list. Talk with your dietitian about what dietary choices are best for you. Grains All bread and crackers made with white flour. Waffles, pancakes, and [place] toast. Bagels. Pretzels. Melba toast, zwieback, and matzoh. Cooked and dried cereals that do not contain whole grains, added fiber, seeds, or dried fruit. CornmealDomenick Gong. Hot and cold cereals made with refined corn, wheat, rice, or oats. Plain pasta and noodles. White rice. Vegetables Well-cooked or canned vegetables without skin, seeds, or stems. Cooked potatoes without skins. Vegetable juice. Fruits Soft-cooked or canned fruits without skin and seeds. Peeled ripe banana. Applesauce. Fruit juice without pulp. Meats and other  protein foods Ground meat. Tender cuts of meat or poultry. Eggs. Fish, seafood, and shellfish. Smooth nut butters. Tofu. Dairy All milk products and drinks. Lactose-free milks, including rice, soy, and almond milks. Yogurt without fruit, nuts, chocolate, or granola mix-ins. Sour cream. Cottage cheese. Cheese. Beverages Decaf coffee. Fruit and vegetable juices or  smoothies (in small amounts, with no pulp or skins, and with fruits from allowed list). Sports drinks. Herbal tea. Fats and oils Olive oil, canola oil, sunflower oil, flaxseed oil, and grapeseed oil. Mayonnaise. Cream cheese. Margarine. Butter. Sweets and desserts Plain cakes and cookies. Cream pies and pies made with allowed fruits. Pudding. Custard. Fruit gelatin. Sherbet. Popsicles. Ice cream without nuts. Plain hard candy. Honey. Jelly. Molasses. Syrups, including chocolate syrup. Chocolate. Marshmallows. Gumdrops. Seasoning and other foods Bouillon. Broth. Cream soups made from allowed foods. Strained soup. Casseroles made with allowed foods. Ketchup. Mild mustard. Mild salad dressings. Plain gravies. Vinegar. Spices in moderation. Salt. Sugar. What foods are not allowed? The items listed below may not be a complete list. Talk with your dietitian about what dietary choices are best for you. Grains Whole wheat and whole grain breads and crackers. Multigrain breads and crackers. Rye bread. Whole grain or multigrain cereals. Cereals with nuts, raisins, or coconut. Bran. Coarse wheat cereals. Granola. High-fiber cereals. Cornmeal or corn bread. Whole grain pasta. Wild or brown rice. Quinoa. Popcorn. Buckwheat. Wheat germ. Vegetables Potato skins. Raw or undercooked vegetables. All beans and bean sprouts. Cooked greens. Corn. Peas. Cabbage. Beets. Broccoli. Brussels sprouts. Cauliflower. Mushrooms. Onions. Peppers. Parsnips. Okra. Sauerkraut. Fruit Raw or dried fruit. Berries. Fruit juice with pulp. Prune juice. Meats and other protein foods Tough, fibrous meats with gristle. Fatty meat. Poultry with skin. Fried meat, Sales executive, or fish. Deli or lunch meats. Sausage, bacon, and hot dogs. Nuts and chunky nut butter. Dried peas, beans, and lentils. Dairy Yogurt with fruit, nuts, chocolate, or granola mix-ins. Beverages Caffeinated coffee and teas. Fats and oils Avocado. Coconut. Sweets and  desserts Desserts, cookies, or candies that contain nuts or coconut. Dried fruit. Jams and preserves with seeds. Marmalade. Any dessert made with fruits or grains that are not allowed. Seasoning and other foods Corn tortilla chips. Soups made with vegetables or grains that are not allowed. Relish. Horseradish. Angie Fava. Olives. Summary  Most people on a low-fiber eating plan should eat less than 10 grams of fiber a day. Follow recommendations from your dietitian about how much fiber you should have each day.  Always check food labels to see the dietary fiber content of packaged foods. In general, a low-fiber food will have fewer than 2 grams of fiber per serving.  In general, try to avoid whole grains, raw fruits and vegetables, dried fruit, tough cuts of meat, nuts, and seeds.  Take a vitamin and mineral supplement as told by your health care provider or dietitian. This information is not intended to replace advice given to you by your health care provider. Make sure you discuss any questions you have with your health care provider. Document Revised: 03/16/2019 Document Reviewed: 01/25/2017 Elsevier Patient Education  2020 [employer].

## 2020-02-27 NOTE — Telephone Encounter (Signed)
Left message to please call our office. 

## 2020-03-03 ENCOUNTER — Telehealth: Payer: Self-pay | Admitting: Gastroenterology

## 2020-03-03 NOTE — Telephone Encounter (Signed)
[facility] clinical pool: will need to postpone procedure unless we focus on just one endoscopic procedure as double not able to be added that day. She needs colonoscopy for surveillance purposes but may benefit now from only an EGD due to dyspepsia. Colonoscopy not urgent and can be done at a later date.   Sherry Salinas: can we reach out to patient to see how she is doing?

## 2020-03-03 NOTE — Telephone Encounter (Signed)
Spoke with pt. Pt is taking Pantoprazole bid and started a [facility] brand probiotic 2 days ago. Pt reports no n/v. Pt is having some loose stool 4 times a day. Pt completed her antibiotics and feels that she needs to be on a daily dose. Pt states when she finishes the antibiotics, she feels the pain, colon problems/loose stool start back up. Pt is having some bladder problems and she feels the yeast hasn't gone away. Pt took the yeast medication as directed and pt is having trouble urinating right now. Pt is going to contact her PCP about the bladder problems she is having.

## 2020-03-03 NOTE — Telephone Encounter (Signed)
Will await advice from AB.

## 2020-03-04 NOTE — Telephone Encounter (Signed)
If pain is worse, then she needs CT.   Is she still having upper abdominal pain? Where is pain located?

## 2020-03-04 NOTE — Telephone Encounter (Signed)
Pt says the pain is the same and no worse.  She is still having upper abdominal pain and in her colon.  Feels like pain is better though.  Pt says she is having to strain to urinate.  Pt says that straining to urinate is making her have small bm's. Pt says she had 6 bm's today.  Stools are dark brown.  [phone]

## 2020-03-05 NOTE — Telephone Encounter (Signed)
Patient still c/o vaginal irritation and burning. Also with her urine she can never empty the bladder has urgency/ urinary frequency. Patient denies blood in urine or fever. Patient is experience discomfort while urinating. But it really burns after urination. She has never seen urologist.

## 2020-03-05 NOTE — Telephone Encounter (Addendum)
Abdomen feels bloated. Burning/itching in vagina and urethra area.   No upper abdominal pain. This has resolved with Protonix 40 mg daily.  Notes looser stool, no constipation. LLQ Pain better. Feels bloated.   Colonoscopy: we need to postpone this to around June. Patient aware of this and thought 4/12 was already cancelled.   No EGD needed at this time. Plan for colonoscopy in future. She is to call if any changes.

## 2020-03-06 MED ORDER — NITROFURANTOIN MONOHYD MACRO 100 MG PO CAPS: 100.0000 mg | ORAL_CAPSULE | Freq: Two times a day (BID) | ORAL | 0 refills | Status: AC

## 2020-03-06 NOTE — Telephone Encounter (Signed)
Sherry Salinas, please advise about upcoming procedure.

## 2020-03-06 NOTE — Telephone Encounter (Signed)
She would [facility] to see if [facility] will help.  She would like to have a referral to urology also.

## 2020-03-06 NOTE — Telephone Encounter (Signed)
New prescription sent. Referral placed.

## 2020-03-06 NOTE — Telephone Encounter (Signed)
Patient aware and verbalizes understanding. 

## 2020-03-06 NOTE — Telephone Encounter (Signed)
A urine culture was not completed because her urine did not look bad. Would she like a different antibiotic? Is she asking for a referral to a urologist?

## 2020-03-08 DIAGNOSIS — J209 Acute bronchitis, unspecified: Secondary | ICD-10-CM | POA: Diagnosis not present

## 2020-03-08 DIAGNOSIS — J029 Acute pharyngitis, unspecified: Secondary | ICD-10-CM | POA: Diagnosis not present

## 2020-03-10 DIAGNOSIS — H5200 Hypermetropia, unspecified eye: Secondary | ICD-10-CM | POA: Diagnosis not present

## 2020-03-10 DIAGNOSIS — H2513 Age-related nuclear cataract, bilateral: Secondary | ICD-10-CM | POA: Diagnosis not present

## 2020-03-10 DIAGNOSIS — E7800 Pure hypercholesterolemia, unspecified: Secondary | ICD-10-CM | POA: Diagnosis not present

## 2020-03-10 NOTE — Telephone Encounter (Signed)
PA for TCS submitted via [employer] website. Case went to clinical review. [employer] tracking# [id number]. Clinical notes faxed to [facility].

## 2020-03-10 NOTE — Telephone Encounter (Signed)
Called pt, TCS w/Prop w/RMR rescheduled to 06/16/20 at 1:00pm. Endo scheduler informed. Pre-op and COVID test 06/13/20. Letter mailed with procedure instructions. Tri-Lyte not available at pharmacy. Miralax instructions mailed.

## 2020-03-11 ENCOUNTER — Other Ambulatory Visit: Payer: Self-pay

## 2020-03-11 NOTE — Telephone Encounter (Signed)
TCS approved. [employer]# [id number], valid 06/16/20-07/16/20.

## 2020-03-14 ENCOUNTER — Encounter (HOSPITAL_COMMUNITY): Payer: Medicare HMO

## 2020-03-14 ENCOUNTER — Other Ambulatory Visit (HOSPITAL_COMMUNITY): Payer: Medicare HMO

## 2020-03-15 ENCOUNTER — Other Ambulatory Visit: Payer: Self-pay | Admitting: Family Medicine

## 2020-03-15 DIAGNOSIS — F411 Generalized anxiety disorder: Secondary | ICD-10-CM

## 2020-03-26 ENCOUNTER — Other Ambulatory Visit: Payer: Self-pay | Admitting: Family Medicine

## 2020-03-26 DIAGNOSIS — I10 Essential (primary) hypertension: Secondary | ICD-10-CM

## 2020-04-03 ENCOUNTER — Other Ambulatory Visit: Payer: Self-pay

## 2020-04-03 ENCOUNTER — Ambulatory Visit (INDEPENDENT_AMBULATORY_CARE_PROVIDER_SITE_OTHER): Payer: Medicare HMO | Admitting: Family Medicine

## 2020-04-03 ENCOUNTER — Encounter: Payer: Self-pay | Admitting: Family Medicine

## 2020-04-03 VITALS — BP 135/83 | HR 89 | Temp 97.8°F | Ht 63.0 in | Wt 143.0 lb

## 2020-04-03 DIAGNOSIS — M25561 Pain in right knee: Secondary | ICD-10-CM | POA: Diagnosis not present

## 2020-04-03 DIAGNOSIS — R3989 Other symptoms and signs involving the genitourinary system: Secondary | ICD-10-CM | POA: Diagnosis not present

## 2020-04-03 DIAGNOSIS — M25512 Pain in left shoulder: Secondary | ICD-10-CM

## 2020-04-03 DIAGNOSIS — G8929 Other chronic pain: Secondary | ICD-10-CM | POA: Diagnosis not present

## 2020-04-03 DIAGNOSIS — J3089 Other allergic rhinitis: Secondary | ICD-10-CM

## 2020-04-03 DIAGNOSIS — I10 Essential (primary) hypertension: Secondary | ICD-10-CM

## 2020-04-03 DIAGNOSIS — B373 Candidiasis of vulva and vagina: Secondary | ICD-10-CM | POA: Diagnosis not present

## 2020-04-03 DIAGNOSIS — B3731 Acute candidiasis of vulva and vagina: Secondary | ICD-10-CM

## 2020-04-03 DIAGNOSIS — F411 Generalized anxiety disorder: Secondary | ICD-10-CM

## 2020-04-03 DIAGNOSIS — Z79899 Other long term (current) drug therapy: Secondary | ICD-10-CM

## 2020-04-03 LAB — MICROSCOPIC EXAMINATION
Bacteria, UA: NONE SEEN
Renal Epithel, UA: NONE SEEN /hpf

## 2020-04-03 LAB — URINALYSIS, COMPLETE
Bilirubin, UA: NEGATIVE
Glucose, UA: NEGATIVE
Ketones, UA: NEGATIVE
Leukocytes,UA: NEGATIVE
Nitrite, UA: NEGATIVE
Protein,UA: NEGATIVE
Specific Gravity, UA: 1.02 (ref 1.005–1.030)
Urobilinogen, Ur: 0.2 mg/dL (ref 0.2–1.0)
pH, UA: 5 (ref 5.0–7.5)

## 2020-04-03 MED ORDER — HYDROCHLOROTHIAZIDE 12.5 MG PO CAPS: 12.5000 mg | ORAL_CAPSULE | Freq: Every day | ORAL | 1 refills | Status: DC

## 2020-04-03 MED ORDER — METOPROLOL SUCCINATE ER 50 MG PO TB24: 50.0000 mg | ORAL_TABLET | Freq: Every day | ORAL | 1 refills | Status: DC

## 2020-04-03 MED ORDER — FEXOFENADINE HCL 60 MG PO TABS: 60.0000 mg | ORAL_TABLET | Freq: Two times a day (BID) | ORAL | 1 refills | Status: DC

## 2020-04-03 MED ORDER — DICYCLOMINE HCL 20 MG PO TABS: 20.0000 mg | ORAL_TABLET | Freq: Three times a day (TID) | ORAL | 1 refills | Status: DC

## 2020-04-03 MED ORDER — TRAMADOL HCL 50 MG PO TABS: 50.0000 mg | ORAL_TABLET | Freq: Two times a day (BID) | ORAL | 2 refills | Status: DC | PRN

## 2020-04-03 MED ORDER — CEFDINIR 300 MG PO CAPS: 300.0000 mg | ORAL_CAPSULE | Freq: Two times a day (BID) | ORAL | 0 refills | Status: AC

## 2020-04-03 MED ORDER — PANTOPRAZOLE SODIUM 40 MG PO TBEC: 40.0000 mg | DELAYED_RELEASE_TABLET | Freq: Every day | ORAL | 1 refills | Status: DC

## 2020-04-03 MED ORDER — EZETIMIBE 10 MG PO TABS: 10.0000 mg | ORAL_TABLET | Freq: Every day | ORAL | 1 refills | Status: DC

## 2020-04-03 MED ORDER — FLUCONAZOLE 150 MG PO TABS: 150.0000 mg | ORAL_TABLET | Freq: Once | ORAL | 0 refills | Status: AC

## 2020-04-03 MED ORDER — BUSPIRONE HCL 7.5 MG PO TABS: 7.5000 mg | ORAL_TABLET | Freq: Two times a day (BID) | ORAL | 1 refills | Status: DC

## 2020-04-03 NOTE — Progress Notes (Signed)
Assessment & Plan:  1. GAD (generalized anxiety disorder) - Well controlled on current regimen.  - busPIRone (BUSPAR) 7.5 MG tablet; Take 1 tablet (7.5 mg total) by mouth 2 (two) times daily.  Dispense: 180 tablet; Refill: 1  2. Benign essential hypertension - Well controlled on current regimen.  - hydrochlorothiazide (MICROZIDE) 12.5 MG capsule; Take 1 capsule (12.5 mg total) by mouth daily.  Dispense: 90 capsule; Refill: 1 - metoprolol succinate (TOPROL-XL) 50 MG 24 hr tablet; Take 1 tablet (50 mg total) by mouth daily. Take with or immediately following a meal.  Dispense: 90 tablet; Refill: 1  3-5. Chronic left shoulder pain/Chronic pain of right knee/Controlled substance agreement signed - Well controlled on current regimen.  - traMADol (ULTRAM) 50 MG tablet; Take 1 tablet (50 mg total) by mouth 2 (two) times daily as needed.  Dispense: 60 tablet; Refill: 2  6. Suspected UTI - Urine Culture - Urinalysis, Complete - cefdinir (OMNICEF) 300 MG capsule; Take 1 capsule (300 mg total) by mouth 2 (two) times daily for 10 days. 1 po BID  Dispense: 20 capsule; Refill: 0 - Microscopic Examination  7. Vaginal yeast infection - fluconazole (DIFLUCAN) 150 MG tablet; Take 1 tablet (150 mg total) by mouth once for 1 dose. May repeat after 3 days if needed.  Dispense: 2 tablet; Refill: 0  8. Non-seasonal allergic rhinitis, unspecified trigger - Medication changed from Xyzal to Oriental but discussed with patient that her insurance probably isn't going to pay because they are OTC. - fexofenadine (ALLEGRA ALLERGY) 60 MG tablet; Take 1 tablet (60 mg total) by mouth 2 (two) times daily.  Dispense: 180 tablet; Refill: 1   Message sent to Roseanne Kaufman, NP with GI regarding colonoscopy prep. Our clinical pharmacist did some research and found that [facility] [place] is able to get GaviLyte suprep. I am letting GI determine if this is appropriate and prescribe if it is.   Return in  about 3 months (around 07/03/2020) for follow-up of chronic medication conditions.  Hendricks Limes, MSN, APRN, FNP-C [facility]  Subjective:    Patient ID: Sherry Salinas, female    DOB: 06-Jan-1956, 64 y.o.   MRN: [id number]  Patient Care Team: Loman Brooklyn, FNP as PCP - General (Family Medicine) Gala Romney Cristopher Estimable, MD as Consulting Physician (Gastroenterology)   Chief Complaint:  Chief Complaint  Patient presents with  . Anxiety    6 week follow up- patient states her anxiety is doing much better    HPI: Sherry Salinas is a 64 y.o. female presenting on 04/03/2020 for Anxiety (6 week follow up- patient states her anxiety is doing much better)  Patient is here for follow-up of anxiety and states that it is doing much better.  Depression screen [facility] 2/9 04/07/2020 02/23/2020 01/11/2020  Decreased Interest 1 3 2   Down, Depressed, Hopeless 1 3 2   PHQ - 2 Score 2 6 4   Altered sleeping 3 2 1   Tired, decreased energy 1 3 2   Change in appetite 0 0 0  Feeling bad or failure about yourself  1 2 1   Trouble concentrating 1 2 1   Moving slowly or fidgety/restless 1 0 0  Suicidal thoughts 0 0 0  PHQ-9 Score 9 15 9   Difficult doing work/chores Not difficult at all Somewhat difficult Somewhat difficult   GAD 7 : Generalized Anxiety Score 04/07/2020 02/23/2020 01/11/2020 12/28/2019  Nervous, Anxious, on Edge 1 3 2 1   Control/stop worrying 1  3 1 1   Worry too much - different things 1 3 1 1   Trouble relaxing 2 3 1 1   Restless 2 3 0 0  Easily annoyed or irritable 2 3 2 1   Afraid - awful might happen 1 3 1  0  Total GAD 7 Score 10 21 8 5   Anxiety Difficulty Not difficult at all Somewhat difficult Somewhat difficult -    Pain assessment: Cause of pain- arthritis, right knee injury with surgery, left shoulder injury falling off horse Pain location- right knee, left shoulder, hands/joints Pain on scale of 1-10- 6-7/10 at night when trying to get comfortable Frequency-  daily What increases pain- lying down What makes pain better- using it, Tramadol  Effects on ADL - unable to do things she use too Any change in general medical condition- No  Current opioids rx- Tramadol 50 mg BID PRN # meds rx- 60 Effectiveness of current meds- effective Adverse reactions form pain meds- none Morphine equivalent- 10 MME/day  Pill count performed-No Last drug screen - 12/28/2019 ( high risk q75m, moderate risk q68m, low risk yearly ) Urine drug screen today- No Was the [place] reviewed- yes  If yes were their any concerning findings? - no   Overdose risk: 310  Opioid Risk  04/07/2020  Alcohol 0  Illegal Drugs 0  Rx Drugs 0  Alcohol 0  Illegal Drugs 0  Rx Drugs 0  Age between 16-45 years  1  History of Preadolescent Sexual Abuse 0  Psychological Disease 0  Depression 0  Opioid Risk Tool Scoring 1  Opioid Risk Interpretation Low Risk   Pain contract signed on: 12/28/2019   New complaints: Patient is concerned that the prep for her colonoscopy is on backorder and she was given a list of medications to buy over-the-counter to take as her prep.  Her levocetirizine is also not being covered by insurance so she would like a new prescription for her allergies.    Social history:  Relevant past medical, surgical, family and social history reviewed and updated as indicated. Interim medical history since our last visit reviewed.  Allergies and medications reviewed and updated.  DATA REVIEWED: CHART IN EPIC  ROS: Negative unless specifically indicated above in HPI.    Current Outpatient Medications:  .  busPIRone (BUSPAR) 7.5 MG tablet, Take 1 tablet (7.5 mg total) by mouth 2 (two) times daily., Disp: 180 tablet, Rfl: 1 .  dicyclomine (BENTYL) 20 MG tablet, Take 1 tablet (20 mg total) by mouth 3 (three) times daily., Disp: 270 tablet, Rfl: 1 .  EPINEPHrine 0.3 mg/0.3 mL IJ SOAJ injection, Inject into the muscle., Disp: , Rfl:  .  ezetimibe (ZETIA) 10 MG  tablet, Take 1 tablet (10 mg total) by mouth daily., Disp: 90 tablet, Rfl: 1 .  hydrochlorothiazide (MICROZIDE) 12.5 MG capsule, Take 1 capsule (12.5 mg total) by mouth daily., Disp: 90 capsule, Rfl: 1 .  metoprolol succinate (TOPROL-XL) 50 MG 24 hr tablet, Take 1 tablet (50 mg total) by mouth daily. Take with or immediately following a meal., Disp: 90 tablet, Rfl: 1 .  ondansetron (ZOFRAN) 4 MG tablet, Take 4 mg by mouth every 8 (eight) hours as needed for nausea or vomiting., Disp: , Rfl:  .  pantoprazole (PROTONIX) 40 MG tablet, Take 1 tablet (40 mg total) by mouth daily. 30 minutes before breakfast daily, Disp: 90 tablet, Rfl: 1 .  traMADol (ULTRAM) 50 MG tablet, Take 1 tablet (50 mg total) by mouth 2 (two) times daily as  needed., Disp: 60 tablet, Rfl: 2 .  triamcinolone cream (KENALOG) 0.1 %, Apply topically 2 (two) times daily as needed., Disp: , Rfl:  .  cefdinir (OMNICEF) 300 MG capsule, Take 1 capsule (300 mg total) by mouth 2 (two) times daily for 10 days. 1 po BID, Disp: 20 capsule, Rfl: 0 .  fexofenadine (ALLEGRA ALLERGY) 60 MG tablet, Take 1 tablet (60 mg total) by mouth 2 (two) times daily., Disp: 180 tablet, Rfl: 1   Allergies  Allergen Reactions  . Bee Venom Anaphylaxis  . Codeine Anaphylaxis  . Other Other (See Comments)    Muscle aches  . Statins Other (See Comments)    Muscle aches  . Sulfa Antibiotics Rash  . Sulfasalazine Rash   Past Medical History:  Diagnosis Date  . Anxiety   . Arthritis   . Hyperlipidemia   . Hypertension   . IBS (irritable bowel syndrome)   . Vitamin D deficiency 09/14/2017    Past Surgical History:  Procedure Laterality Date  . COLONOSCOPY  2015   Dr. Amedeo Plenty: per pt, diverticulosis, 3 polyps, come back in five years.   . COLONOSCOPY WITH ESOPHAGOGASTRODUODENOSCOPY (EGD)  02/2003   Dr. Amedeo Plenty: Grade 3 esophagitis, hiatal hernia with patulous GE junction, mild duodenitis.  CLOtest, results unavailable.  Colonoscopy normal.  . REPLACEMENT  TOTAL KNEE Right 2012  . ROTATOR CUFF REPAIR Right     Social History   Socioeconomic History  . Marital status: Married    Spouse name: Not on file  . Number of children: Not on file  . Years of education: Not on file  . Highest education level: Not on file  Occupational History  . Not on file  Tobacco Use  . Smoking status: Current Every Day Smoker    Packs/day: 1.00    Types: Cigarettes  . Smokeless tobacco: Never Used  Substance and Sexual Activity  . Alcohol use: Never  . Drug use: Never  . Sexual activity: Not on file  Other Topics Concern  . Not on file  Social History Narrative  . Not on file   Social Determinants of Health   Financial Resource Strain:   . Difficulty of Paying Living Expenses:   Food Insecurity:   . Worried About Charity fundraiser in the Last Year:   . Arboriculturist in the Last Year:   Transportation Needs:   . Film/video editor (Medical):   Marland Kitchen Lack of Transportation (Non-Medical):   Physical Activity:   . Days of Exercise per Week:   . Minutes of Exercise per Session:   Stress:   . Feeling of Stress :   Social Connections:   . Frequency of Communication with Friends and Family:   . Frequency of Social Gatherings with Friends and Family:   . Attends Religious Services:   . Active Member of Clubs or Organizations:   . Attends Archivist Meetings:   Marland Kitchen Marital Status:   Intimate Partner Violence:   . Fear of Current or Ex-Partner:   . Emotionally Abused:   Marland Kitchen Physically Abused:   . Sexually Abused:         Objective:    BP 135/83   Pulse 89   Temp 97.8 F (36.6 C) (Temporal)   Ht 5\' 3"  (1.6 m)   Wt 143 lb (64.9 kg)   SpO2 94%   BMI 25.33 kg/m   Wt Readings from Last 3 Encounters:  04/03/20 143 lb (64.9 kg)  02/26/20  139 lb 12.8 oz (63.4 kg)  02/22/20 140 lb (63.5 kg)    Physical Exam Vitals reviewed.  Constitutional:      General: She is not in acute distress.    Appearance: Normal appearance. She is  normal weight. She is not ill-appearing, toxic-appearing or diaphoretic.  HENT:     Head: Normocephalic and atraumatic.  Eyes:     General: No scleral icterus.       Right eye: No discharge.        Left eye: No discharge.     Conjunctiva/sclera: Conjunctivae normal.  Cardiovascular:     Rate and Rhythm: Normal rate and regular rhythm.     Heart sounds: Normal heart sounds. No murmur. No friction rub. No gallop.   Pulmonary:     Effort: Pulmonary effort is normal. No respiratory distress.     Breath sounds: Normal breath sounds. No stridor. No wheezing, rhonchi or rales.  Musculoskeletal:        General: Normal range of motion.     Cervical back: Normal range of motion.  Skin:    General: Skin is warm and dry.     Capillary Refill: Capillary refill takes less than 2 seconds.  Neurological:     General: No focal deficit present.     Mental Status: She is alert and oriented to person, place, and time. Mental status is at baseline.  Psychiatric:        Mood and Affect: Mood normal.        Behavior: Behavior normal.        Thought Content: Thought content normal.        Judgment: Judgment normal.     Lab Results  Component Value Date   TSH 1.990 01/11/2020   Lab Results  Component Value Date   WBC 8.9 01/11/2020   HGB 15.7 01/11/2020   HCT 46.1 01/11/2020   MCV 98 (H) 01/11/2020   PLT 239 01/11/2020   Lab Results  Component Value Date   NA 141 01/11/2020   K 4.6 01/11/2020   CO2 22 01/11/2020   GLUCOSE 98 01/11/2020   BUN 12 01/11/2020   CREATININE 0.87 01/11/2020   BILITOT 0.4 01/11/2020   ALKPHOS 85 01/11/2020   AST 20 01/11/2020   ALT 20 01/11/2020   PROT 7.1 01/11/2020   ALBUMIN 4.3 01/11/2020   CALCIUM 10.2 01/11/2020   Lab Results  Component Value Date   CHOL 241 (H) 01/11/2020   Lab Results  Component Value Date   HDL 46 01/11/2020   Lab Results  Component Value Date   LDLCALC 150 (H) 01/11/2020   Lab Results  Component Value Date   TRIG 244  (H) 01/11/2020   Lab Results  Component Value Date   CHOLHDL 5.2 (H) 01/11/2020   No results found for: HGBA1C

## 2020-04-04 LAB — URINE CULTURE

## 2020-04-07 ENCOUNTER — Encounter: Payer: Self-pay | Admitting: Family Medicine

## 2020-04-08 ENCOUNTER — Telehealth: Payer: Self-pay | Admitting: *Deleted

## 2020-04-08 MED ORDER — NA SULFATE-K SULFATE-MG SULF 17.5-3.13-1.6 GM/177ML PO SOLN: 1.0000 | Freq: Once | ORAL | 0 refills | Status: AC

## 2020-04-08 NOTE — Telephone Encounter (Signed)
-----   Message from Sherry Needs, NP sent at 04/08/2020 12:06 PM EDT ----- Regarding: prep Hi Sherry Salinas!  I am copying our coordinators on here so they can review. I do believe suprep should be fine. Thanks! ----- Message ----- From: Sherry Brooklyn, FNP Sent: 04/07/2020  12:56 PM EDT To: Sherry Needs, NP  Sherry Salinas, Good afternoon. I am Sherry Salinas, a NP over at [facility]. Our mutual patient has an appointment for a colonoscopy in the next couple of months. She came in to the office and expressed concern about the prep being on back order and being given a list of medications to purchase OTC. We have a clinical pharmacist in our office who looked into this for the patient. She was able to figure out that [facility] [place] to get GaviLyte suprep. I told the patient I would let you know so that you could determine if this is appropriate and send in a prescription if it is.   Thank you, Karsten Fells

## 2020-04-08 NOTE — Telephone Encounter (Signed)
LMOVM for pt.  Suprep sent into pharmacy. Instructions mailed for suprep as well.

## 2020-04-17 DIAGNOSIS — R351 Nocturia: Secondary | ICD-10-CM | POA: Diagnosis not present

## 2020-04-17 DIAGNOSIS — R3912 Poor urinary stream: Secondary | ICD-10-CM | POA: Diagnosis not present

## 2020-04-17 DIAGNOSIS — R350 Frequency of micturition: Secondary | ICD-10-CM | POA: Diagnosis not present

## 2020-04-17 DIAGNOSIS — R300 Dysuria: Secondary | ICD-10-CM | POA: Diagnosis not present

## 2020-04-17 DIAGNOSIS — R3911 Hesitancy of micturition: Secondary | ICD-10-CM | POA: Diagnosis not present

## 2020-04-18 ENCOUNTER — Telehealth: Payer: Self-pay | Admitting: Internal Medicine

## 2020-04-18 NOTE — Telephone Encounter (Signed)
Please call patient about her tcs

## 2020-04-21 ENCOUNTER — Other Ambulatory Visit: Payer: Self-pay | Admitting: Family Medicine

## 2020-04-21 DIAGNOSIS — E782 Mixed hyperlipidemia: Secondary | ICD-10-CM

## 2020-04-21 NOTE — Telephone Encounter (Signed)
Patient called back. She states she has decided she just wants to do the miralax prep for her TCS. She states the suprep was going to cost $47. Patient asked if I could re mail her those instructions. I have done so

## 2020-04-21 NOTE — Telephone Encounter (Signed)
LMOVM

## 2020-05-21 ENCOUNTER — Telehealth: Payer: Self-pay | Admitting: Internal Medicine

## 2020-05-21 NOTE — Telephone Encounter (Signed)
Letter mailed

## 2020-05-21 NOTE — Telephone Encounter (Signed)
Recall for ultrasound 

## 2020-06-03 ENCOUNTER — Telehealth: Payer: Self-pay

## 2020-06-03 NOTE — Telephone Encounter (Signed)
Pt called office and LMOVM, she received recall letter for [place] RUQ to be done in August. She requested call back to explain need for [place].  Called and informed pt LSL recommended [place] RUQ in 6 months when she had CT done 01/2020 to check liver lesions. Pt said she is still paying on last bill and has TCS coming up. She will let office know if she decides to have [place].

## 2020-06-04 ENCOUNTER — Telehealth: Payer: Self-pay | Admitting: Internal Medicine

## 2020-06-04 NOTE — Telephone Encounter (Signed)
Called patient. She states she is in dispute with her insurance company right now about the cost of her copays and other medical bills. She did not want to schedule the [place] now and she also wanted to cancel her procedure. She did not want to r/s. Called endo and made aware to cancel. FYI to AB

## 2020-06-04 NOTE — Telephone Encounter (Signed)
PATIENT RETURNED [place] [phone]

## 2020-06-05 ENCOUNTER — Telehealth: Payer: Self-pay | Admitting: Emergency Medicine

## 2020-06-05 NOTE — Telephone Encounter (Signed)
Pt called verified name and dob Stated she has been putting off the scan for her liver but wanted to know if there are any natural vitamin  Supplements for her liver that is safe fir her to take

## 2020-06-06 NOTE — Telephone Encounter (Signed)
At this time, would avoid OTC supplements.    Instructions for fatty liver: Recommend 1-2# weight loss per week until ideal body weight through exercise & diet. Low fat/cholesterol diet.   Avoid sweets, sodas, fruit juices, sweetened beverages like tea, etc. Gradually increase exercise from 15 min daily up to 1 hr per day 5 days/week. Limit/avoid alcohol use.

## 2020-06-10 ENCOUNTER — Telehealth: Payer: Self-pay | Admitting: Internal Medicine

## 2020-06-10 NOTE — Telephone Encounter (Signed)
Spoke to pt See previous note

## 2020-06-10 NOTE — Telephone Encounter (Signed)
Called lmom

## 2020-06-10 NOTE — Telephone Encounter (Signed)
Pt said she was returning call from [place]. [phone]

## 2020-06-10 NOTE — Telephone Encounter (Signed)
Pt returned call and I notified her of providers recommendations . Pt thanked me for the call

## 2020-06-11 DIAGNOSIS — Z0100 Encounter for examination of eyes and vision without abnormal findings: Secondary | ICD-10-CM | POA: Diagnosis not present

## 2020-06-13 ENCOUNTER — Encounter (HOSPITAL_COMMUNITY): Payer: Medicare HMO

## 2020-06-13 ENCOUNTER — Other Ambulatory Visit (HOSPITAL_COMMUNITY): Payer: Medicare HMO

## 2020-06-16 ENCOUNTER — Encounter (HOSPITAL_COMMUNITY): Admission: RE | Payer: Self-pay | Source: Home / Self Care

## 2020-06-16 ENCOUNTER — Ambulatory Visit (HOSPITAL_COMMUNITY): Admission: RE | Admit: 2020-06-16 | Payer: Medicare HMO | Source: Home / Self Care | Admitting: Internal Medicine

## 2020-06-16 SURGERY — COLONOSCOPY WITH PROPOFOL
Anesthesia: Monitor Anesthesia Care | Laterality: Left

## 2020-07-03 ENCOUNTER — Ambulatory Visit (INDEPENDENT_AMBULATORY_CARE_PROVIDER_SITE_OTHER): Payer: Medicare HMO | Admitting: Family Medicine

## 2020-07-03 ENCOUNTER — Telehealth: Payer: Self-pay | Admitting: Pharmacist

## 2020-07-03 ENCOUNTER — Other Ambulatory Visit: Payer: Self-pay

## 2020-07-03 ENCOUNTER — Encounter: Payer: Self-pay | Admitting: Family Medicine

## 2020-07-03 VITALS — BP 123/74 | HR 68 | Temp 98.0°F | Ht 63.0 in | Wt 146.8 lb

## 2020-07-03 DIAGNOSIS — M25551 Pain in right hip: Secondary | ICD-10-CM

## 2020-07-03 DIAGNOSIS — K219 Gastro-esophageal reflux disease without esophagitis: Secondary | ICD-10-CM

## 2020-07-03 DIAGNOSIS — E782 Mixed hyperlipidemia: Secondary | ICD-10-CM

## 2020-07-03 DIAGNOSIS — Z8739 Personal history of other diseases of the musculoskeletal system and connective tissue: Secondary | ICD-10-CM

## 2020-07-03 DIAGNOSIS — M25512 Pain in left shoulder: Secondary | ICD-10-CM | POA: Diagnosis not present

## 2020-07-03 DIAGNOSIS — F411 Generalized anxiety disorder: Secondary | ICD-10-CM | POA: Diagnosis not present

## 2020-07-03 DIAGNOSIS — Z91030 Bee allergy status: Secondary | ICD-10-CM

## 2020-07-03 DIAGNOSIS — R252 Cramp and spasm: Secondary | ICD-10-CM | POA: Diagnosis not present

## 2020-07-03 DIAGNOSIS — J3089 Other allergic rhinitis: Secondary | ICD-10-CM | POA: Diagnosis not present

## 2020-07-03 DIAGNOSIS — M25561 Pain in right knee: Secondary | ICD-10-CM

## 2020-07-03 DIAGNOSIS — K589 Irritable bowel syndrome without diarrhea: Secondary | ICD-10-CM | POA: Diagnosis not present

## 2020-07-03 DIAGNOSIS — G8929 Other chronic pain: Secondary | ICD-10-CM

## 2020-07-03 DIAGNOSIS — I10 Essential (primary) hypertension: Secondary | ICD-10-CM | POA: Diagnosis not present

## 2020-07-03 DIAGNOSIS — Z79899 Other long term (current) drug therapy: Secondary | ICD-10-CM | POA: Diagnosis not present

## 2020-07-03 MED ORDER — PANTOPRAZOLE SODIUM 40 MG PO TBEC: 40.0000 mg | DELAYED_RELEASE_TABLET | Freq: Every day | ORAL | 1 refills | Status: DC

## 2020-07-03 MED ORDER — HYDROCHLOROTHIAZIDE 12.5 MG PO CAPS: 12.5000 mg | ORAL_CAPSULE | Freq: Every day | ORAL | 1 refills | Status: DC

## 2020-07-03 MED ORDER — BUSPIRONE HCL 7.5 MG PO TABS: 7.5000 mg | ORAL_TABLET | Freq: Two times a day (BID) | ORAL | 1 refills | Status: DC

## 2020-07-03 MED ORDER — TRAMADOL HCL 50 MG PO TABS: 50.0000 mg | ORAL_TABLET | Freq: Two times a day (BID) | ORAL | 2 refills | Status: DC | PRN

## 2020-07-03 MED ORDER — METOPROLOL SUCCINATE ER 50 MG PO TB24: 50.0000 mg | ORAL_TABLET | Freq: Every day | ORAL | 1 refills | Status: DC

## 2020-07-03 MED ORDER — EPINEPHRINE 0.3 MG/0.3ML IJ SOAJ: 0.3000 mg | INTRAMUSCULAR | 3 refills | Status: DC | PRN

## 2020-07-03 MED ORDER — EZETIMIBE 10 MG PO TABS: 10.0000 mg | ORAL_TABLET | Freq: Every day | ORAL | 1 refills | Status: DC

## 2020-07-03 MED ORDER — DICYCLOMINE HCL 20 MG PO TABS: 20.0000 mg | ORAL_TABLET | Freq: Three times a day (TID) | ORAL | 1 refills | Status: DC

## 2020-07-03 MED ORDER — LEVOCETIRIZINE DIHYDROCHLORIDE 5 MG PO TABS: 5.0000 mg | ORAL_TABLET | Freq: Every evening | ORAL | 2 refills | Status: DC

## 2020-07-03 NOTE — Telephone Encounter (Signed)
epi pens only $45 for 2 pens--i [facility] Patient made aware

## 2020-07-03 NOTE — Progress Notes (Signed)
Assessment & Plan:  1. Benign essential hypertension - Well controlled on current regimen.  - hydrochlorothiazide (MICROZIDE) 12.5 MG capsule; Take 1 capsule (12.5 mg total) by mouth daily.  Dispense: 90 capsule; Refill: 1 - metoprolol succinate (TOPROL-XL) 50 MG 24 hr tablet; Take 1 tablet (50 mg total) by mouth daily. Take with or immediately following a meal.  Dispense: 90 tablet; Refill: 1 - CMP14+EGFR - CBC with Differential/Platelet  2-4. Chronic left shoulder pain/Chronic pain of right knee/Controlled substance agreement signed - Well controlled on current regimen. Controlled substance agreement in place. Last UDS 12/28/2019 appropriate. PDMP reviewed with no concerning findings.  - traMADol (ULTRAM) 50 MG tablet; Take 1 tablet (50 mg total) by mouth 2 (two) times daily as needed.  Dispense: 60 tablet; Refill: 2 - CMP14+EGFR  5. GAD (generalized anxiety disorder) - Well controlled on current regimen.  - busPIRone (BUSPAR) 7.5 MG tablet; Take 1 tablet (7.5 mg total) by mouth 2 (two) times daily.  Dispense: 180 tablet; Refill: 1 - CMP14+EGFR  6. Mixed hyperlipidemia - Patient is taking Zetia. Will re-check lipid panel at her next visit in 3 months.  - ezetimibe (ZETIA) 10 MG tablet; Take 1 tablet (10 mg total) by mouth daily.  Dispense: 90 tablet; Refill: 1 - CMP14+EGFR  7. Gastroesophageal reflux disease, unspecified whether esophagitis present - Well controlled on current regimen.  - pantoprazole (PROTONIX) 40 MG tablet; Take 1 tablet (40 mg total) by mouth daily. 30 minutes before breakfast daily  Dispense: 90 tablet; Refill: 1  8. Non-seasonal allergic rhinitis, unspecified trigger - Well controlled on current regimen.  - levocetirizine (XYZAL) 5 MG tablet; Take 1 tablet (5 mg total) by mouth every evening.  Dispense: 90 tablet; Refill: 2  9. Irritable bowel syndrome without diarrhea - Well controlled on current regimen. - dicyclomine (BENTYL) 20 MG tablet; Take 1 tablet  (20 mg total) by mouth 3 (three) times daily.  Dispense: 270 tablet; Refill: 1  10. Bee sting allergy - Clinical pharmacist sent Epi-Pen to [facility] for $45 (2 pens). Patient is much happier with this.   11. Acute right hip pain - Relieved with Tylenol.   12. History of burning pain in lower extremity - Patient feels this could be related to the Zetia. She is going to add CoQ10 to see if this improves the symptom.  - Vitamin B12 - CBC with Differential/Platelet  13. Cramp of both lower extremities - Magnesium - CMP14+EGFR - CBC with Differential/Platelet   Return in about 3 months (around 10/03/2020) for follow-up of chronic medication conditions.  Sherry Limes, MSN, APRN, FNP-C [facility]  Subjective:    Patient ID: Sherry Salinas, female    DOB: 1956-01-19, 64 y.o.   MRN: [id number]  Patient Care Team: Sherry Brooklyn, FNP as PCP - General (Family Medicine) Sherry Romney Cristopher Estimable, MD as Consulting Physician (Gastroenterology)   Chief Complaint:  Chief Complaint  Patient presents with  . Anxiety    3 month follow up of chronic medical conditions  . Hypertension  . Hip Pain    Patient states 2 days ago she was walking and her right hip and leg started to get very painful and started burning.    HPI: Sherry Salinas is a 64 y.o. female presenting on 07/03/2020 for Anxiety (3 month follow up of chronic medical conditions), Hypertension, and Hip Pain (Patient states 2 days ago she was walking and her right hip and leg started to get very painful and  started burning.)  Patient would like to switch Allegra back to Xyzal due to cost.   BP well controlled at home.   Pain assessment: Cause of pain-arthritis, right knee injury with surgery, left shoulder injury falling off horse Pain location-right knee, left shoulder, hands/joints Pain on scale of 1-10-6-7/10 at night when trying to get comfortable Frequency-daily What increases pain-lying down What  makes painbetter-using it, Tramadol Effects on ADL -unable to do things she use too Any change in general medical condition- No  Current opioids rx- Tramadol 50 mg BID PRN # meds rx- 60 Effectiveness of current meds- effective Adverse reactions form pain meds- none Morphine equivalent- 10 MME/day  Pill count performed-No Last drug screen - 12/28/2019 ( high risk q51m moderate risk q638mlow risk yearly ) Urine drug screen today- No [place]             If yes were their any concerning findings? - no   Overdose risk: 310  Opioid Risk  04/07/2020  Alcohol 0  Illegal Drugs 0  Rx Drugs 0  Alcohol 0  Illegal Drugs 0  Rx Drugs 0  Age between 16-45 years  1  History of Preadolescent Sexual Abuse 0  Psychological Disease 0  Depression 0  Opioid Risk Tool Scoring 1  Opioid Risk Interpretation Low Risk   Pain contract signed on: 12/28/2019   New complaints: Patient is in need of a refill of her Epi-Pen but would like a way to get it cheaper as it costs a couple of hundred dollars.   Patient c/o right hip pain that started after she was walking two days ago. She has also been experiencing cramping for the past two days. In addition, she reports burning and weakness in her legs for the past month. Tylenol and Tramadol do relieve the pain in the hip.    Social history:  Relevant past medical, surgical, family and social history reviewed and updated as indicated. Interim medical history since our last visit reviewed.  Allergies and medications reviewed and updated.  DATA REVIEWED: CHART IN EPIC  ROS: Negative unless specifically indicated above in HPI.    Current Outpatient Medications:  .  busPIRone (BUSPAR) 7.5 MG tablet, Take 1 tablet (7.5 mg total) by mouth 2 (two) times daily., Disp: 180 tablet, Rfl: 1 .  dicyclomine (BENTYL) 20 MG tablet, Take 1 tablet (20 mg total) by mouth 3 (three) times daily., Disp: 270 tablet, Rfl: 1 .  EPINEPHrine 0.3  mg/0.3 mL IJ SOAJ injection, Inject into the muscle., Disp: , Rfl:  .  ezetimibe (ZETIA) 10 MG tablet, TAKE 1 TABLET BY MOUTH EVERY DAY, Disp: 90 tablet, Rfl: 0 .  fexofenadine (ALLEGRA ALLERGY) 60 MG tablet, Take 1 tablet (60 mg total) by mouth 2 (two) times daily., Disp: 180 tablet, Rfl: 1 .  hydrochlorothiazide (MICROZIDE) 12.5 MG capsule, Take 1 capsule (12.5 mg total) by mouth daily., Disp: 90 capsule, Rfl: 1 .  metoprolol succinate (TOPROL-XL) 50 MG 24 hr tablet, Take 1 tablet (50 mg total) by mouth daily. Take with or immediately following a meal., Disp: 90 tablet, Rfl: 1 .  ondansetron (ZOFRAN) 4 MG tablet, Take 4 mg by mouth every 8 (eight) hours as needed for nausea or vomiting., Disp: , Rfl:  .  pantoprazole (PROTONIX) 40 MG tablet, Take 1 tablet (40 mg total) by mouth daily. 30 minutes before breakfast daily, Disp: 90 tablet, Rfl: 1 .  traMADol (ULTRAM) 50 MG tablet, Take 1 tablet (50  mg total) by mouth 2 (two) times daily as needed., Disp: 60 tablet, Rfl: 2 .  triamcinolone cream (KENALOG) 0.1 %, Apply topically 2 (two) times daily as needed., Disp: , Rfl:    Allergies  Allergen Reactions  . Bee Venom Anaphylaxis  . Codeine Anaphylaxis  . Other Other (See Comments)    Muscle aches  . Statins Other (See Comments)    Muscle aches  . Sulfa Antibiotics Rash  . Sulfasalazine Rash   Past Medical History:  Diagnosis Date  . Anxiety   . Arthritis   . Hyperlipidemia   . Hypertension   . IBS (irritable bowel syndrome)   . Vitamin D deficiency 09/14/2017    Past Surgical History:  Procedure Laterality Date  . COLONOSCOPY  2015   Dr. Amedeo Plenty: per pt, diverticulosis, 3 polyps, come back in five years.   . COLONOSCOPY WITH ESOPHAGOGASTRODUODENOSCOPY (EGD)  02/2003   Dr. Amedeo Plenty: Grade 3 esophagitis, hiatal hernia with patulous GE junction, mild duodenitis.  CLOtest, results unavailable.  Colonoscopy normal.  . REPLACEMENT TOTAL KNEE Right 2012  . ROTATOR CUFF REPAIR Right     Social  History   Socioeconomic History  . Marital status: Married    Spouse name: Not on file  . Number of children: Not on file  . Years of education: Not on file  . Highest education level: Not on file  Occupational History  . Not on file  Tobacco Use  . Smoking status: Current Every Day Smoker    Packs/day: 1.00    Types: Cigarettes  . Smokeless tobacco: Never Used  Vaping Use  . Vaping Use: Never used  Substance and Sexual Activity  . Alcohol use: Never  . Drug use: Never  . Sexual activity: Not on file  Other Topics Concern  . Not on file  Social History Narrative  . Not on file   Social Determinants of Health   Financial Resource Strain:   . Difficulty of Paying Living Expenses:   Food Insecurity:   . Worried About Charity fundraiser in the Last Year:   . Arboriculturist in the Last Year:   Transportation Needs:   . Film/video editor (Medical):   Marland Kitchen Lack of Transportation (Non-Medical):   Physical Activity:   . Days of Exercise per Week:   . Minutes of Exercise per Session:   Stress:   . Feeling of Stress :   Social Connections:   . Frequency of Communication with Friends and Family:   . Frequency of Social Gatherings with Friends and Family:   . Attends Religious Services:   . Active Member of Clubs or Organizations:   . Attends Archivist Meetings:   Marland Kitchen Marital Status:   Intimate Partner Violence:   . Fear of Current or Ex-Partner:   . Emotionally Abused:   Marland Kitchen Physically Abused:   . Sexually Abused:         Objective:    BP 123/74   Pulse 68   Temp 98 F (36.7 C) (Temporal)   Ht 5' 3" (1.6 m)   Wt 146 lb 12.8 oz (66.6 kg)   SpO2 97%   BMI 26.00 kg/m   Wt Readings from Last 3 Encounters:  07/03/20 146 lb 12.8 oz (66.6 kg)  04/03/20 143 lb (64.9 kg)  02/26/20 139 lb 12.8 oz (63.4 kg)    Physical Exam Vitals reviewed.  Constitutional:      General: She is not in acute distress.  Appearance: Normal appearance. She is  overweight. She is not ill-appearing, toxic-appearing or diaphoretic.  HENT:     Head: Normocephalic and atraumatic.  Eyes:     General: No scleral icterus.       Right eye: No discharge.        Left eye: No discharge.     Conjunctiva/sclera: Conjunctivae normal.  Cardiovascular:     Rate and Rhythm: Normal rate and regular rhythm.     Heart sounds: Normal heart sounds. No murmur heard.  No friction rub. No gallop.   Pulmonary:     Effort: Pulmonary effort is normal. No respiratory distress.     Breath sounds: Normal breath sounds. No stridor. No wheezing, rhonchi or rales.  Musculoskeletal:        General: Normal range of motion.     Cervical back: Normal range of motion.  Skin:    General: Skin is warm and dry.     Capillary Refill: Capillary refill takes less than 2 seconds.  Neurological:     General: No focal deficit present.     Mental Status: She is alert and oriented to person, place, and time. Mental status is at baseline.  Psychiatric:        Mood and Affect: Mood normal.        Behavior: Behavior normal.        Thought Content: Thought content normal.        Judgment: Judgment normal.     Lab Results  Component Value Date   TSH 1.990 01/11/2020   Lab Results  Component Value Date   WBC WILL FOLLOW 07/03/2020   HGB WILL FOLLOW 07/03/2020   HCT WILL FOLLOW 07/03/2020   MCV WILL FOLLOW 07/03/2020   PLT WILL FOLLOW 07/03/2020   Lab Results  Component Value Date   NA 142 07/03/2020   K 3.9 07/03/2020   CO2 26 07/03/2020   GLUCOSE 106 (H) 07/03/2020   BUN 15 07/03/2020   CREATININE 0.87 07/03/2020   BILITOT <0.2 07/03/2020   ALKPHOS 74 07/03/2020   AST 17 07/03/2020   ALT 18 07/03/2020   PROT 6.2 07/03/2020   ALBUMIN 3.8 07/03/2020   CALCIUM 9.7 07/03/2020   Lab Results  Component Value Date   CHOL 241 (H) 01/11/2020   Lab Results  Component Value Date   HDL 46 01/11/2020   Lab Results  Component Value Date   LDLCALC 150 (H) 01/11/2020    Lab Results  Component Value Date   TRIG 244 (H) 01/11/2020   Lab Results  Component Value Date   CHOLHDL 5.2 (H) 01/11/2020   No results found for: HGBA1C

## 2020-07-07 ENCOUNTER — Encounter: Payer: Self-pay | Admitting: Family Medicine

## 2020-07-07 DIAGNOSIS — Z79899 Other long term (current) drug therapy: Secondary | ICD-10-CM | POA: Insufficient documentation

## 2020-07-07 LAB — CMP14+EGFR
ALT: 18 IU/L (ref 0–32)
AST: 17 IU/L (ref 0–40)
Albumin/Globulin Ratio: 1.6 (ref 1.2–2.2)
Albumin: 3.8 g/dL (ref 3.8–4.8)
Alkaline Phosphatase: 74 IU/L (ref 48–121)
BUN/Creatinine Ratio: 17 (ref 12–28)
BUN: 15 mg/dL (ref 8–27)
Bilirubin Total: <0.2 mg/dL (ref 0.0–1.2)
CO2: 26 mmol/L (ref 20–29)
Calcium: 9.7 mg/dL (ref 8.7–10.3)
Chloride: 102 mmol/L (ref 96–106)
Creatinine, Ser: 0.87 mg/dL (ref 0.57–1.00)
GFR calc Af Amer: 81 mL/min/{1.73_m2} (ref >59)
GFR calc non Af Amer: 70 mL/min/{1.73_m2} (ref >59)
Globulin, Total: 2.4 g/dL (ref 1.5–4.5)
Glucose: 106 mg/dL — ABNORMAL HIGH (ref 65–99)
Potassium: 3.9 mmol/L (ref 3.5–5.2)
Sodium: 142 mmol/L (ref 134–144)
Total Protein: 6.2 g/dL (ref 6.0–8.5)

## 2020-07-07 LAB — CBC WITH DIFFERENTIAL/PLATELET

## 2020-07-07 LAB — VITAMIN B12: Vitamin B-12: 258 pg/mL (ref 232–1245)

## 2020-07-07 LAB — MAGNESIUM: Magnesium: 2.1 mg/dL (ref 1.6–2.3)

## 2020-07-08 DIAGNOSIS — R300 Dysuria: Secondary | ICD-10-CM | POA: Diagnosis not present

## 2020-07-08 DIAGNOSIS — R102 Pelvic and perineal pain: Secondary | ICD-10-CM | POA: Diagnosis not present

## 2020-07-08 DIAGNOSIS — R350 Frequency of micturition: Secondary | ICD-10-CM | POA: Diagnosis not present

## 2020-07-08 DIAGNOSIS — R3911 Hesitancy of micturition: Secondary | ICD-10-CM | POA: Diagnosis not present

## 2020-07-10 ENCOUNTER — Ambulatory Visit: Payer: Medicare HMO | Attending: Internal Medicine

## 2020-07-10 DIAGNOSIS — Z23 Encounter for immunization: Secondary | ICD-10-CM

## 2020-07-10 NOTE — Progress Notes (Signed)
° °  Covid-19 Vaccination Clinic  Name:  IVA MONTELONGO    MRN: [id number] DOB: 1956/05/19  07/10/2020 . Ms. Glauber was observed post Covid-19 immunization for 30 minutes based on pre-vaccination screening without incident. She was provided with Vaccine Information Sheet and instruction to access the V-Safe system.   Ms. Eder was instructed to call 911 with any severe reactions post vaccine:  Difficulty breathing   Swelling of face and throat   A fast heartbeat   A bad rash all over body   Dizziness and weakness   Immunizations Administered    Name Date Dose VIS Date Route   Pfizer COVID-19 Vaccine 07/10/2020  2:42 PM 0.3 mL 01/30/2019 Intramuscular   Manufacturer: [place]   Lot: [id number]   [place]: [id number]

## 2020-07-15 ENCOUNTER — Ambulatory Visit: Payer: Medicare HMO | Admitting: Gastroenterology

## 2020-07-15 NOTE — Progress Notes (Deleted)
Primary Care Physician: Loman Brooklyn, FNP  Primary Gastroenterologist:  Garfield Cornea, MD   No chief complaint on file.   HPI: Sherry Salinas is a 64 y.o. female here for follow-up.  Last seen in March.  She has a longstanding history of IBS, personal history of colon polyps in 2015, diverticulitis.  She was scheduled for an EGD and colonoscopy in March 2021 for epigastric pain, history of colon polyps, patient ended up canceling her procedures due to a dispute with her insurance company.  Also elected to forego right upper quadrant ultrasound to further evaluate tiny liver lesion seen on CT which were too small to characterize at the time.   Current Outpatient Medications  Medication Sig Dispense Refill  . busPIRone (BUSPAR) 7.5 MG tablet Take 1 tablet (7.5 mg total) by mouth 2 (two) times daily. 180 tablet 1  . dicyclomine (BENTYL) 20 MG tablet Take 1 tablet (20 mg total) by mouth 3 (three) times daily. 270 tablet 1  . EPINEPHrine 0.3 mg/0.3 mL IJ SOAJ injection Inject into the muscle.    Marland Kitchen EPINEPHrine 0.3 mg/0.3 mL IJ SOAJ injection Inject 0.3 mLs (0.3 mg total) into the muscle as needed for anaphylaxis. 1 each 3  . ezetimibe (ZETIA) 10 MG tablet Take 1 tablet (10 mg total) by mouth daily. 90 tablet 1  . hydrochlorothiazide (MICROZIDE) 12.5 MG capsule Take 1 capsule (12.5 mg total) by mouth daily. 90 capsule 1  . levocetirizine (XYZAL) 5 MG tablet Take 1 tablet (5 mg total) by mouth every evening. 90 tablet 2  . metoprolol succinate (TOPROL-XL) 50 MG 24 hr tablet Take 1 tablet (50 mg total) by mouth daily. Take with or immediately following a meal. 90 tablet 1  . ondansetron (ZOFRAN) 4 MG tablet Take 4 mg by mouth every 8 (eight) hours as needed for nausea or vomiting.    . pantoprazole (PROTONIX) 40 MG tablet Take 1 tablet (40 mg total) by mouth daily. 30 minutes before breakfast daily 90 tablet 1  . traMADol (ULTRAM) 50 MG tablet Take 1 tablet (50 mg total) by mouth 2  (two) times daily as needed. 60 tablet 2  . triamcinolone cream (KENALOG) 0.1 % Apply topically 2 (two) times daily as needed.     No current facility-administered medications for this visit.    Allergies as of 07/15/2020 - Review Complete 07/07/2020  Allergen Reaction Noted  . Bee venom Anaphylaxis 07/31/2013  . Codeine Anaphylaxis 09/24/2011  . Other Other (See Comments) 07/19/2019  . Statins Other (See Comments) 07/19/2019  . Sulfa antibiotics Rash 05/14/2013  . Sulfasalazine Rash 05/14/2013    ROS:  General: Negative for anorexia, weight loss, fever, chills, fatigue, weakness. ENT: Negative for hoarseness, difficulty swallowing , nasal congestion. CV: Negative for chest pain, angina, palpitations, dyspnea on exertion, peripheral edema.  Respiratory: Negative for dyspnea at rest, dyspnea on exertion, cough, sputum, wheezing.  GI: See history of present illness. GU:  Negative for dysuria, hematuria, urinary incontinence, urinary frequency, nocturnal urination.  Endo: Negative for unusual weight change.    Physical Examination:   There were no vitals taken for this visit.  General: Well-nourished, well-developed in no acute distress.  Eyes: No icterus. Mouth: Oropharyngeal mucosa moist and pink , no lesions erythema or exudate. Lungs: Clear to auscultation bilaterally.  Heart: Regular rate and rhythm, no murmurs rubs or gallops.  Abdomen: Bowel sounds are normal, nontender, nondistended, no hepatosplenomegaly or masses, no abdominal bruits or hernia , no  rebound or guarding.   Extremities: No lower extremity edema. No clubbing or deformities. Neuro: Alert and oriented x 4   Skin: Warm and dry, no jaundice.   Psych: Alert and cooperative, normal mood and affect.  Labs:  Lab Results  Component Value Date   CREATININE 0.87 07/03/2020   BUN 15 07/03/2020   NA 142 07/03/2020   K 3.9 07/03/2020   CL 102 07/03/2020   CO2 26 07/03/2020   Lab Results  Component Value Date     ALT 18 07/03/2020   AST 17 07/03/2020   ALKPHOS 74 07/03/2020   BILITOT <0.2 07/03/2020   Lab Results  Component Value Date   WBC CANCELED 07/03/2020   HGB CANCELED 07/03/2020   HCT CANCELED 07/03/2020   MCV 98 (H) 01/11/2020   PLT CANCELED 07/03/2020   Lab Results  Component Value Date   VITAMINB12 258 07/03/2020     Imaging Studies: No results found.

## 2020-07-31 ENCOUNTER — Ambulatory Visit: Payer: Medicare HMO | Attending: Internal Medicine

## 2020-07-31 DIAGNOSIS — Z23 Encounter for immunization: Secondary | ICD-10-CM

## 2020-07-31 NOTE — Progress Notes (Signed)
   Covid-19 Vaccination Clinic  Name:  Sherry Salinas    MRN: [id number] DOB: 07/03/1956  07/31/2020  [name] was observed post Covid-19 immunization for 15 minutes without incident. She was provided with Vaccine Information Sheet and instruction to access the V-Safe system.   [name] was instructed to call 911 with any severe reactions post vaccine: Marland Kitchen Difficulty breathing  . Swelling of face and throat  . A fast heartbeat  . A bad rash all over body  . Dizziness and weakness   Immunizations Administered    Name Date Dose VIS Date Route   Pfizer COVID-19 Vaccine 07/31/2020  2:05 PM 0.3 mL 01/30/2019 Intramuscular   Manufacturer: [place]   Lot: [id number]   [place]: [address]-4      Covid-19 Vaccination Clinic  Name:  Sherry Salinas    MRN: [id number] DOB: 20-Apr-1956  07/31/2020  [name] was observed post Covid-19 immunization for 15 minutes without incident. She was provided with Vaccine Information Sheet and instruction to access the V-Safe system.   [name] was instructed to call 911 with any severe reactions post vaccine: Marland Kitchen Difficulty breathing  . Swelling of face and throat  . A fast heartbeat  . A bad rash all over body  . Dizziness and weakness   Immunizations Administered    Name Date Dose VIS Date Route   Pfizer COVID-19 Vaccine 07/31/2020  2:05 PM 0.3 mL 01/30/2019 Intramuscular   Manufacturer: [place]   Lot: [id number]   [place]: [address]-8

## 2020-08-12 ENCOUNTER — Other Ambulatory Visit: Payer: Medicare HMO

## 2020-08-12 ENCOUNTER — Other Ambulatory Visit: Payer: Self-pay

## 2020-08-12 DIAGNOSIS — E782 Mixed hyperlipidemia: Secondary | ICD-10-CM | POA: Diagnosis not present

## 2020-08-13 ENCOUNTER — Encounter: Payer: Self-pay | Admitting: Family Medicine

## 2020-08-13 LAB — CBC WITH DIFFERENTIAL/PLATELET
Basophils Absolute: 0 10*3/uL (ref 0.0–0.2)
Basos: 0 %
EOS (ABSOLUTE): 0.2 10*3/uL (ref 0.0–0.4)
Eos: 2 %
Hematocrit: 43.4 % (ref 34.0–46.6)
Hemoglobin: 14.4 g/dL (ref 11.1–15.9)
Immature Grans (Abs): 0 10*3/uL (ref 0.0–0.1)
Immature Granulocytes: 0 %
Lymphocytes Absolute: 3 10*3/uL (ref 0.7–3.1)
Lymphs: 33 %
MCH: 32.5 pg (ref 26.6–33.0)
MCHC: 33.2 g/dL (ref 31.5–35.7)
MCV: 98 fL — ABNORMAL HIGH (ref 79–97)
Monocytes Absolute: 1.1 10*3/uL — ABNORMAL HIGH (ref 0.1–0.9)
Monocytes: 12 %
Neutrophils Absolute: 4.8 10*3/uL (ref 1.4–7.0)
Neutrophils: 53 %
Platelets: 230 10*3/uL (ref 150–450)
RBC: 4.43 x10E6/uL (ref 3.77–5.28)
RDW: 11.8 % (ref 11.7–15.4)
WBC: 9.1 10*3/uL (ref 3.4–10.8)

## 2020-10-03 ENCOUNTER — Ambulatory Visit: Payer: Medicare HMO | Admitting: Family Medicine

## 2020-10-06 ENCOUNTER — Encounter: Payer: Self-pay | Admitting: Nurse Practitioner

## 2020-10-06 ENCOUNTER — Other Ambulatory Visit: Payer: Self-pay

## 2020-10-06 ENCOUNTER — Ambulatory Visit (INDEPENDENT_AMBULATORY_CARE_PROVIDER_SITE_OTHER): Payer: Medicare HMO | Admitting: Nurse Practitioner

## 2020-10-06 VITALS — BP 125/67 | HR 73 | Temp 97.7°F | Resp 20 | Ht 63.0 in | Wt 149.0 lb

## 2020-10-06 DIAGNOSIS — Z23 Encounter for immunization: Secondary | ICD-10-CM | POA: Diagnosis not present

## 2020-10-06 DIAGNOSIS — E785 Hyperlipidemia, unspecified: Secondary | ICD-10-CM | POA: Diagnosis not present

## 2020-10-06 DIAGNOSIS — I10 Essential (primary) hypertension: Secondary | ICD-10-CM | POA: Diagnosis not present

## 2020-10-06 DIAGNOSIS — M79604 Pain in right leg: Secondary | ICD-10-CM | POA: Diagnosis not present

## 2020-10-06 DIAGNOSIS — M79605 Pain in left leg: Secondary | ICD-10-CM | POA: Insufficient documentation

## 2020-10-06 MED ORDER — TRIAMCINOLONE ACETONIDE 0.1 % EX CREA: TOPICAL_CREAM | Freq: Two times a day (BID) | CUTANEOUS | 2 refills | Status: DC | PRN

## 2020-10-06 MED ORDER — ONDANSETRON HCL 4 MG PO TABS: 4.0000 mg | ORAL_TABLET | Freq: Three times a day (TID) | ORAL | 1 refills | Status: DC | PRN

## 2020-10-06 NOTE — Assessment & Plan Note (Signed)
Well managed on current medication.  No changes necessary.  Continue to provide education with printed handouts given.  Continue to maintain a healthy diet and exercise regimen as tolerated. Follow-up in 3 months.

## 2020-10-06 NOTE — Progress Notes (Signed)
Established Patient Office Visit  Subjective:  Patient ID: Sherry Salinas, female    DOB: January 02, 1956  Age: 64 y.o. MRN: [id number]  CC:  Chief Complaint  Patient presents with  . Medical Management of Chronic Issues    3 mo   . Hyperlipidemia  . Gastroesophageal Reflux    HPI Sherry Salinas presents for Mixed hyperlipidemia  Pt presents with hyperlipidemia. Patient was diagnosed in 11/23/2011. Compliance with treatment has been fair; The patient is compliant with medications, maintains a low cholesterol diet , follows up as directed , and maintains an exercise regimen as tolerated. The patient denies experiencing any hypercholesterolemia related symptoms.  Current medication Zetia 10 mg tablet.    Pt presents for follow up of hypertension. Patient was diagnosed in 09/27/2011.. The patient is tolerating the medication well without side effects. Compliance with treatment has been good; including taking medication as directed , maintains a healthy diet and regular exercise regimen as tolerated, and following up as directed. Current medication metoprolol 50 mg tablet.  Leg Pain This is new for patient in the last few months.  Patient is reporting worsening leg pain bilateral lower extremity with numbness tingling and tightness in the calf and sometimes radiates to hip and thighs.  Patient reports this as intermittent and has tried to eliminate medications like Zetia, to see if that worsens or makes symptoms better.    Past Medical History:  Diagnosis Date  . Anxiety   . Arthritis   . Hyperlipidemia   . Hypertension   . IBS (irritable bowel syndrome)   . Vitamin D deficiency 09/14/2017    Past Surgical History:  Procedure Laterality Date  . COLONOSCOPY  2015   Dr. Amedeo Plenty: per pt, diverticulosis, 3 polyps, come back in five years.   . COLONOSCOPY WITH ESOPHAGOGASTRODUODENOSCOPY (EGD)  02/2003   Dr. Amedeo Plenty: Grade 3 esophagitis, hiatal hernia with patulous GE junction, mild  duodenitis.  CLOtest, results unavailable.  Colonoscopy normal.  . REPLACEMENT TOTAL KNEE Right 2012  . ROTATOR CUFF REPAIR Right     Family History  Problem Relation Age of Onset  . Arthritis Mother   . Kidney failure Mother   . Rheum arthritis Mother   . Stroke Father   . Heart disease Father   . Heart attack Father   . Stroke Maternal Grandfather   . Early death Paternal Grandmother        Childbirth  . Early death Paternal Grandfather        MVA  . Colon cancer Paternal Uncle     Social History   Socioeconomic History  . Marital status: Married    Spouse name: Not on file  . Number of children: Not on file  . Years of education: Not on file  . Highest education level: Not on file  Occupational History  . Not on file  Tobacco Use  . Smoking status: Current Every Day Smoker    Packs/day: 1.00    Types: Cigarettes  . Smokeless tobacco: Never Used  Vaping Use  . Vaping Use: Never used  Substance and Sexual Activity  . Alcohol use: Never  . Drug use: Never  . Sexual activity: Not on file  Other Topics Concern  . Not on file  Social History Narrative  . Not on file   Social Determinants of Health  Outpatient Medications Prior to Visit  Medication Sig Dispense Refill  . busPIRone (BUSPAR) 7.5 MG tablet Take 1 tablet (7.5 mg total) by mouth 2 (two) times daily. 180 tablet 1  . dicyclomine (BENTYL) 20 MG tablet Take 1 tablet (20 mg total) by mouth 3 (three) times daily. 270 tablet 1  . EPINEPHrine 0.3 mg/0.3 mL IJ SOAJ injection Inject 0.3 mLs (0.3 mg total) into the muscle as needed for anaphylaxis. 1 each 3  . ezetimibe (ZETIA) 10 MG tablet Take 1 tablet (10 mg total) by mouth daily. 90 tablet 1  . hydrochlorothiazide (MICROZIDE) 12.5 MG capsule Take 1 capsule (12.5 mg total) by mouth daily. 90 capsule 1  . levocetirizine (XYZAL) 5 MG tablet Take 1 tablet (5 mg total)  by mouth every evening. 90 tablet 2  . metoprolol succinate (TOPROL-XL) 50 MG 24 hr tablet Take 1 tablet (50 mg total) by mouth daily. Take with or immediately following a meal. 90 tablet 1  . ondansetron (ZOFRAN) 4 MG tablet Take 4 mg by mouth every 8 (eight) hours as needed for nausea or vomiting.    . pantoprazole (PROTONIX) 40 MG tablet Take 1 tablet (40 mg total) by mouth daily. 30 minutes before breakfast daily 90 tablet 1  . traMADol (ULTRAM) 50 MG tablet Take 1 tablet (50 mg total) by mouth 2 (two) times daily as needed. 60 tablet 2  . triamcinolone cream (KENALOG) 0.1 % Apply topically 2 (two) times daily as needed.    Marland Kitchen EPINEPHrine 0.3 mg/0.3 mL IJ SOAJ injection Inject into the muscle.     No facility-administered medications prior to visit.    Allergies  Allergen Reactions  . Bee Venom Anaphylaxis  . Codeine Anaphylaxis  . Other Other (See Comments)    Muscle aches  . Statins Other (See Comments)    Muscle aches  . Sulfa Antibiotics Rash  . Sulfasalazine Rash    ROS Review of Systems  Musculoskeletal: Positive for joint swelling.  All other systems reviewed and are negative.     Objective:    Physical Exam Vitals reviewed.  HENT:     Head: Normocephalic.     Mouth/Throat:     Mouth: Mucous membranes are moist.     Pharynx: Oropharynx is clear.  Eyes:     Conjunctiva/sclera: Conjunctivae normal.  Cardiovascular:     Rate and Rhythm: Normal rate and regular rhythm.     Pulses: Normal pulses.     Heart sounds: Normal heart sounds.  Pulmonary:     Effort: Pulmonary effort is normal.     Breath sounds: Normal breath sounds.  Abdominal:     General: Bowel sounds are normal.  Musculoskeletal:        General: Tenderness present.     Comments: Bilateral lower extremity pain  Skin:    General: Skin is warm.  Neurological:     Mental Status: She is alert and oriented to person, place, and time.  Psychiatric:        Behavior: Behavior normal.     BP (!)  143/69   Pulse 73   Temp 97.7 F (36.5 C)   Resp 20   Ht 5\' 3"  (1.6 m)   Wt 149 lb (67.6 kg)   SpO2 100%   BMI 26.39 kg/m  Wt Readings from Last 3 Encounters:  10/06/20 149 lb (67.6 kg)  07/03/20 146 lb 12.8 oz (66.6 kg)  04/03/20 143 lb (64.9 kg)     Health Maintenance Due  Topic Date Due  . DEXA SCAN  Never done    There are no preventive care reminders to display for this patient.  Lab Results  Component Value Date   TSH 1.990 01/11/2020   Lab Results  Component Value Date   WBC 9.1 08/12/2020   HGB 14.4 08/12/2020   HCT 43.4 08/12/2020   MCV 98 (H) 08/12/2020   PLT 230 08/12/2020   Lab Results  Component Value Date   NA 142 07/03/2020   K 3.9 07/03/2020   CO2 26 07/03/2020   GLUCOSE 106 (H) 07/03/2020   BUN 15 07/03/2020   CREATININE 0.87 07/03/2020   BILITOT <0.2 07/03/2020   ALKPHOS 74 07/03/2020   AST 17 07/03/2020   ALT 18 07/03/2020   PROT 6.2 07/03/2020   ALBUMIN 3.8 07/03/2020   CALCIUM 9.7 07/03/2020   Lab Results  Component Value Date   CHOL 241 (H) 01/11/2020   Lab Results  Component Value Date   HDL 46 01/11/2020   Lab Results  Component Value Date   LDLCALC 150 (H) 01/11/2020   Lab Results  Component Value Date   TRIG 244 (H) 01/11/2020   Lab Results  Component Value Date   CHOLHDL 5.2 (H) 01/11/2020   No results found for: HGBA1C    Assessment & Plan:   Problem List Items Addressed This Visit      Cardiovascular and Mediastinum   Benign essential hypertension    Well managed on current medication.  No changes necessary.  Continue to provide education with printed handouts given.  Continue to maintain a healthy diet and exercise regimen as tolerated. Follow-up in 3 months.           Other   Hyperlipemia - Primary    Well-managed on current medication no changes to medication dose.  Labs completed- lipid panel.  Results pending.  Provided education to patient with printed handouts given.  Follow-up in 3  months.      Relevant Medications   triamcinolone cream (KENALOG) 0.1 %   ondansetron (ZOFRAN) 4 MG tablet   Other Relevant Orders   Lipid Panel   Pain in both lower extremities    This is new for patient in the last 3 months.  Patient reports worsening leg pain bilateral lower extremity with numbness tingling and tightness in the calf with pain radiating to hips and thighs.  Patient has not used anything over-the-counter to alleviate the pain.  Provided education to patient on smoking cessation due to causes of peripheral neuropathy long-term.  Advised patient to stay hydrated, smoking cessation and use of Cymbalta.  Patient is now willing to start Cymbalta but will increase hydration and think about smoking cessation in the future.   Education handouts provided to patient.  Rx refill sent to pharmacy  Follow-up with worsening or unresolved symptoms.       Other Visit Diagnoses    Need for immunization against influenza       Relevant Orders   Flu Vaccine QUAD 36+ mos IM (Completed)      Meds ordered this encounter  Medications  . triamcinolone cream (KENALOG) 0.1 %    Sig: Apply topically 2 (two) times daily as needed.    Dispense:  30 g    Refill:  2  . ondansetron (ZOFRAN) 4 MG tablet    Sig: Take 1 tablet (4 mg total) by mouth every 8 (eight) hours as needed for nausea or vomiting.    Dispense:  30 tablet  Refill:  1    Follow-up: Return in about 3 months (around 01/06/2021).    Ivy Lynn, NP

## 2020-10-06 NOTE — Assessment & Plan Note (Signed)
This is new for patient in the last 3 months.  Patient reports worsening leg pain bilateral lower extremity with numbness tingling and tightness in the calf with pain radiating to hips and thighs.  Patient has not used anything over-the-counter to alleviate the pain.  Provided education to patient on smoking cessation due to causes of peripheral neuropathy long-term.  Advised patient to stay hydrated, smoking cessation and use of Cymbalta.  Patient is now willing to start Cymbalta but will increase hydration and think about smoking cessation in the future.   Education handouts provided to patient.  Rx refill sent to pharmacy  Follow-up with worsening or unresolved symptoms.

## 2020-10-06 NOTE — Patient Instructions (Signed)
High Cholesterol  High cholesterol is a condition in which the blood has high levels of a white, waxy, fat-like substance (cholesterol). The human body needs small amounts of cholesterol. The liver makes all the cholesterol that the body needs. Extra (excess) cholesterol comes from the food that we eat. Cholesterol is carried from the liver by the blood through the blood vessels. If you have high cholesterol, deposits (plaques) may build up on the walls of your blood vessels (arteries). Plaques make the arteries narrower and stiffer. Cholesterol plaques increase your risk for heart attack and stroke. Work with your health care provider to keep your cholesterol levels in a healthy range. What increases the risk? This condition is more likely to develop in people who:  Eat foods that are high in animal fat (saturated fat) or cholesterol.  Are overweight.  Are not getting enough exercise.  Have a family history of high cholesterol. What are the signs or symptoms? There are no symptoms of this condition. How is this diagnosed? This condition may be diagnosed from the results of a blood test.  If you are older than age 20, your health care provider may check your cholesterol every 4-6 years.  You may be checked more often if you already have high cholesterol or other risk factors for heart disease. The blood test for cholesterol measures:  "Bad" cholesterol (LDL cholesterol). This is the main type of cholesterol that causes heart disease. The desired level for LDL is less than 100.  "Good" cholesterol (HDL cholesterol). This type helps to protect against heart disease by cleaning the arteries and carrying the LDL away. The desired level for HDL is 60 or higher.  Triglycerides. These are fats that the body can store or burn for energy. The desired number for triglycerides is lower than 150.  Total cholesterol. This is a measure of the total amount of cholesterol in your blood, including LDL  cholesterol, HDL cholesterol, and triglycerides. A healthy number is less than 200. How is this treated? This condition is treated with diet changes, lifestyle changes, and medicines. Diet changes  This may include eating more whole grains, fruits, vegetables, nuts, and fish.  This may also include cutting back on red meat and foods that have a lot of added sugar. Lifestyle changes  Changes may include getting at least 40 minutes of aerobic exercise 3 times a week. Aerobic exercises include walking, biking, and swimming. Aerobic exercise along with a healthy diet can help you maintain a healthy weight.  Changes may also include quitting smoking. Medicines  Medicines are usually given if diet and lifestyle changes have failed to reduce your cholesterol to healthy levels.  Your health care provider may prescribe a statin medicine. Statin medicines have been shown to reduce cholesterol, which can reduce the risk of heart disease. Follow these instructions at home: Eating and drinking If told by your health care provider:  Eat chicken (without skin), fish, veal, shellfish, ground turkey breast, and round or loin cuts of red meat.  Do not eat fried foods or fatty meats, such as hot dogs and salami.  Eat plenty of fruits, such as apples.  Eat plenty of vegetables, such as broccoli, potatoes, and carrots.  Eat beans, peas, and lentils.  Eat grains such as barley, rice, couscous, and bulgur wheat.  Eat pasta without cream sauces.  Use skim or nonfat milk, and eat low-fat or nonfat yogurt and cheeses.  Do not eat or drink whole milk, cream, ice cream, egg yolks,   or hard cheeses.  Do not eat stick margarine or tub margarines that contain trans fats (also called partially hydrogenated oils).  Do not eat saturated tropical oils, such as coconut oil and palm oil.  Do not eat cakes, cookies, crackers, or other baked goods that contain trans fats.  General instructions  Exercise as  directed by your health care provider. Increase your activity level with activities such as gardening, walking, and taking the stairs.  Take over-the-counter and prescription medicines only as told by your health care provider.  Do not use any products that contain nicotine or tobacco, such as cigarettes and e-cigarettes. If you need help quitting, ask your health care provider.  Keep all follow-up visits as told by your health care provider. This is important. Contact a health care provider if:  You are struggling to maintain a healthy diet or weight.  You need help to start on an exercise program.  You need help to stop smoking. Get help right away if:  You have chest pain.  You have trouble breathing. This information is not intended to replace advice given to you by your health care provider. Make sure you discuss any questions you have with your health care provider. Document Revised: 11/25/2017 Document Reviewed: 05/22/2016 Elsevier Patient Education  [place]. Hypertension, Adult Hypertension is another name for high blood pressure. High blood pressure forces your heart to work harder to pump blood. This can cause problems over time. There are two numbers in a blood pressure reading. There is a top number (systolic) over a bottom number (diastolic). It is best to have a blood pressure that is below 120/80. Healthy choices can help lower your blood pressure, or you may need medicine to help lower it. What are the causes? The cause of this condition is not known. Some conditions may be related to high blood pressure. What increases the risk?  Smoking.  Having type 2 diabetes mellitus, high cholesterol, or both.  Not getting enough exercise or physical activity.  Being overweight.  Having too much fat, sugar, calories, or salt (sodium) in your diet.  Drinking too much alcohol.  Having long-term (chronic) kidney disease.  Having a family history of high blood  pressure.  Age. Risk increases with age.  Race. You may be at higher risk if you are African American.  Gender. Men are at higher risk than women before age 9. After age 15, women are at higher risk than men.  Having obstructive sleep apnea.  Stress. What are the signs or symptoms?  High blood pressure may not cause symptoms. Very high blood pressure (hypertensive crisis) may cause: ? Headache. ? Feelings of worry or nervousness (anxiety). ? Shortness of breath. ? Nosebleed. ? A feeling of being sick to your stomach (nausea). ? Throwing up (vomiting). ? Changes in how you see. ? Very bad chest pain. ? Seizures. How is this treated?  This condition is treated by making healthy lifestyle changes, such as: ? Eating healthy foods. ? Exercising more. ? Drinking less alcohol.  Your health care provider may prescribe medicine if lifestyle changes are not enough to get your blood pressure under control, and if: ? Your top number is above 130. ? Your bottom number is above 80.  Your personal target blood pressure may vary. Follow these instructions at home: Eating and drinking   If told, follow the DASH eating plan. To follow this plan: ? Fill one half of your plate at each meal with fruits and  vegetables. ? Fill one fourth of your plate at each meal with whole grains. Whole grains include whole-wheat pasta, brown rice, and whole-grain bread. ? Eat or drink low-fat dairy products, such as skim milk or low-fat yogurt. ? Fill one fourth of your plate at each meal with low-fat (lean) proteins. Low-fat proteins include fish, chicken without skin, eggs, beans, and tofu. ? Avoid fatty meat, cured and processed meat, or chicken with skin. ? Avoid pre-made or processed food.  Eat less than 1,500 mg of salt each day.  Do not drink alcohol if: ? Your doctor tells you not to drink. ? You are pregnant, may be pregnant, or are planning to become pregnant.  If you drink  alcohol: ? Limit how much you use to:  0-1 drink a day for women.  0-2 drinks a day for men. ? Be aware of how much alcohol is in your drink. In the [place] one drink equals one 12 oz bottle of beer (355 mL), one 5 oz glass of wine (148 mL), or one 1 oz glass of hard liquor (44 mL). Lifestyle   Work with your doctor to stay at a healthy weight or to lose weight. Ask your doctor what the best weight is for you.  Get at least 30 minutes of exercise most days of the week. This may include walking, swimming, or biking.  Get at least 30 minutes of exercise that strengthens your muscles (resistance exercise) at least 3 days a week. This may include lifting weights or doing Pilates.  Do not use any products that contain nicotine or tobacco, such as cigarettes, e-cigarettes, and chewing tobacco. If you need help quitting, ask your doctor.  Check your blood pressure at home as told by your doctor.  Keep all follow-up visits as told by your doctor. This is important. Medicines  Take over-the-counter and prescription medicines only as told by your doctor. Follow directions carefully.  Do not skip doses of blood pressure medicine. The medicine does not work as well if you skip doses. Skipping doses also puts you at risk for problems.  Ask your doctor about side effects or reactions to medicines that you should watch for. Contact a doctor if you:  Think you are having a reaction to the medicine you are taking.  Have headaches that keep coming back (recurring).  Feel dizzy.  Have swelling in your ankles.  Have trouble with your vision. Get help right away if you:  Get a very bad headache.  Start to feel mixed up (confused).  Feel weak or numb.  Feel faint.  Have very bad pain in your: ? Chest. ? Belly (abdomen).  Throw up more than once.  Have trouble breathing. Summary  Hypertension is another name for high blood pressure.  High blood pressure forces your heart to work  harder to pump blood.  For most people, a normal blood pressure is less than 120/80.  Making healthy choices can help lower blood pressure. If your blood pressure does not get lower with healthy choices, you may need to take medicine. This information is not intended to replace advice given to you by your health care provider. Make sure you discuss any questions you have with your health care provider. Document Revised: 08/02/2018 Document Reviewed: 08/02/2018 Elsevier Patient Education  2020 [employer].

## 2020-10-06 NOTE — Assessment & Plan Note (Signed)
Well-managed on current medication no changes to medication dose.  Labs completed- lipid panel.  Results pending.  Provided education to patient with printed handouts given.  Follow-up in 3 months.

## 2020-10-07 LAB — LIPID PANEL
Chol/HDL Ratio: 4.9 ratio — ABNORMAL HIGH (ref 0.0–4.4)
Cholesterol, Total: 190 mg/dL (ref 100–199)
HDL: 39 mg/dL — ABNORMAL LOW (ref >39)
LDL Chol Calc (NIH): 116 mg/dL — ABNORMAL HIGH (ref 0–99)
Triglycerides: 198 mg/dL — ABNORMAL HIGH (ref 0–149)
VLDL Cholesterol Cal: 35 mg/dL (ref 5–40)

## 2020-10-09 ENCOUNTER — Other Ambulatory Visit: Payer: Self-pay

## 2020-10-09 DIAGNOSIS — F17210 Nicotine dependence, cigarettes, uncomplicated: Secondary | ICD-10-CM

## 2020-10-09 DIAGNOSIS — Z780 Asymptomatic menopausal state: Secondary | ICD-10-CM

## 2021-01-07 ENCOUNTER — Ambulatory Visit (INDEPENDENT_AMBULATORY_CARE_PROVIDER_SITE_OTHER): Payer: Medicare HMO | Admitting: Family Medicine

## 2021-01-07 ENCOUNTER — Other Ambulatory Visit: Payer: Self-pay

## 2021-01-07 ENCOUNTER — Ambulatory Visit (INDEPENDENT_AMBULATORY_CARE_PROVIDER_SITE_OTHER): Payer: Medicare HMO

## 2021-01-07 ENCOUNTER — Encounter: Payer: Self-pay | Admitting: Family Medicine

## 2021-01-07 VITALS — BP 125/75 | HR 73 | Temp 97.8°F | Ht 63.0 in | Wt 148.0 lb

## 2021-01-07 DIAGNOSIS — E782 Mixed hyperlipidemia: Secondary | ICD-10-CM | POA: Diagnosis not present

## 2021-01-07 DIAGNOSIS — R300 Dysuria: Secondary | ICD-10-CM

## 2021-01-07 DIAGNOSIS — Z780 Asymptomatic menopausal state: Secondary | ICD-10-CM

## 2021-01-07 DIAGNOSIS — F411 Generalized anxiety disorder: Secondary | ICD-10-CM | POA: Diagnosis not present

## 2021-01-07 DIAGNOSIS — R3989 Other symptoms and signs involving the genitourinary system: Secondary | ICD-10-CM

## 2021-01-07 DIAGNOSIS — K219 Gastro-esophageal reflux disease without esophagitis: Secondary | ICD-10-CM | POA: Diagnosis not present

## 2021-01-07 DIAGNOSIS — G8929 Other chronic pain: Secondary | ICD-10-CM | POA: Diagnosis not present

## 2021-01-07 DIAGNOSIS — I10 Essential (primary) hypertension: Secondary | ICD-10-CM | POA: Diagnosis not present

## 2021-01-07 DIAGNOSIS — F17210 Nicotine dependence, cigarettes, uncomplicated: Secondary | ICD-10-CM

## 2021-01-07 DIAGNOSIS — M25561 Pain in right knee: Secondary | ICD-10-CM | POA: Diagnosis not present

## 2021-01-07 DIAGNOSIS — M25512 Pain in left shoulder: Secondary | ICD-10-CM

## 2021-01-07 DIAGNOSIS — K589 Irritable bowel syndrome without diarrhea: Secondary | ICD-10-CM

## 2021-01-07 DIAGNOSIS — Z79899 Other long term (current) drug therapy: Secondary | ICD-10-CM

## 2021-01-07 DIAGNOSIS — Z1211 Encounter for screening for malignant neoplasm of colon: Secondary | ICD-10-CM

## 2021-01-07 DIAGNOSIS — M85851 Other specified disorders of bone density and structure, right thigh: Secondary | ICD-10-CM | POA: Diagnosis not present

## 2021-01-07 LAB — URINALYSIS, COMPLETE
Bilirubin, UA: NEGATIVE
Glucose, UA: NEGATIVE
Ketones, UA: NEGATIVE
Leukocytes,UA: NEGATIVE
Nitrite, UA: NEGATIVE
Protein,UA: NEGATIVE
Specific Gravity, UA: 1.015 (ref 1.005–1.030)
Urobilinogen, Ur: 0.2 mg/dL (ref 0.2–1.0)
pH, UA: 6.5 (ref 5.0–7.5)

## 2021-01-07 LAB — MICROSCOPIC EXAMINATION
Bacteria, UA: NONE SEEN
Epithelial Cells (non renal): NONE SEEN /hpf (ref 0–10)
RBC, Urine: NONE SEEN /hpf (ref 0–2)
WBC, UA: NONE SEEN /hpf (ref 0–5)

## 2021-01-07 MED ORDER — PANTOPRAZOLE SODIUM 40 MG PO TBEC: 40.0000 mg | DELAYED_RELEASE_TABLET | Freq: Every day | ORAL | 1 refills | Status: DC

## 2021-01-07 MED ORDER — NITROFURANTOIN MONOHYD MACRO 100 MG PO CAPS
100.0000 mg | ORAL_CAPSULE | Freq: Two times a day (BID) | ORAL | 0 refills | Status: DC
Start: 2021-01-07 — End: 2021-01-07

## 2021-01-07 MED ORDER — EZETIMIBE 10 MG PO TABS: 10.0000 mg | ORAL_TABLET | Freq: Every day | ORAL | 1 refills | Status: DC

## 2021-01-07 MED ORDER — NITROFURANTOIN MONOHYD MACRO 100 MG PO CAPS: 100.0000 mg | ORAL_CAPSULE | Freq: Two times a day (BID) | ORAL | 0 refills | Status: AC

## 2021-01-07 MED ORDER — TRAMADOL HCL 50 MG PO TABS: 50.0000 mg | ORAL_TABLET | Freq: Two times a day (BID) | ORAL | 2 refills | Status: DC | PRN

## 2021-01-07 MED ORDER — METOPROLOL SUCCINATE ER 50 MG PO TB24: 50.0000 mg | ORAL_TABLET | Freq: Every day | ORAL | 1 refills | Status: DC

## 2021-01-07 MED ORDER — BUSPIRONE HCL 7.5 MG PO TABS: 7.5000 mg | ORAL_TABLET | Freq: Two times a day (BID) | ORAL | 1 refills | Status: DC

## 2021-01-07 MED ORDER — HYDROCHLOROTHIAZIDE 12.5 MG PO CAPS: 12.5000 mg | ORAL_CAPSULE | Freq: Every day | ORAL | 1 refills | Status: DC

## 2021-01-07 MED ORDER — DICYCLOMINE HCL 20 MG PO TABS: 20.0000 mg | ORAL_TABLET | Freq: Three times a day (TID) | ORAL | 1 refills | Status: DC

## 2021-01-07 NOTE — Progress Notes (Signed)
Assessment & Plan:  1. Benign essential hypertension - Well controlled on current regimen.  - hydrochlorothiazide (MICROZIDE) 12.5 MG capsule; Take 1 capsule (12.5 mg total) by mouth daily.  Dispense: 90 capsule; Refill: 1 - metoprolol succinate (TOPROL-XL) 50 MG 24 hr tablet; Take 1 tablet (50 mg total) by mouth daily. Take with or immediately following a meal.  Dispense: 90 tablet; Refill: 1 - CBC with Differential/Platelet; Future - CMP14+EGFR; Future - Lipid panel; Future  2. Mixed hyperlipidemia - Improving. Labs to assess.  Patient to come back for future labs when she is fasting. - ezetimibe (ZETIA) 10 MG tablet; Take 1 tablet (10 mg total) by mouth daily.  Dispense: 90 tablet; Refill: 1 - CMP14+EGFR; Future - Lipid panel; Future  3. GAD (generalized anxiety disorder) - Well controlled on current regimen.  - busPIRone (BUSPAR) 7.5 MG tablet; Take 1 tablet (7.5 mg total) by mouth 2 (two) times daily.  Dispense: 180 tablet; Refill: 1 - CMP14+EGFR; Future  4. Gastroesophageal reflux disease, unspecified whether esophagitis present - Well controlled on current regimen.  - pantoprazole (PROTONIX) 40 MG tablet; Take 1 tablet (40 mg total) by mouth daily. 30 minutes before breakfast daily  Dispense: 90 tablet; Refill: 1 - CMP14+EGFR; Future  5-7. Chronic left shoulder pain/Chronic pain of right knee/Controlled substance agreement signed - Well controlled on current regimen.  Controlled substance agreement updated today.  Urine drug screen obtained today.  PDMP reviewed with no concerning findings. - traMADol (ULTRAM) 50 MG tablet; Take 1 tablet (50 mg total) by mouth 2 (two) times daily as needed.  Dispense: 60 tablet; Refill: 2 - ToxASSURE Select 13 (MW), Urine - CMP14+EGFR; Future  8. Irritable bowel syndrome without diarrhea - Well controlled on current regimen.  - dicyclomine (BENTYL) 20 MG tablet; Take 1 tablet (20 mg total) by mouth 3 (three) times daily.  Dispense: 270  tablet; Refill: 1 - CMP14+EGFR; Future  9. Dysuria - Urinalysis, Complete - Urine Culture  10. Suspected UTI - Education provided on UTIs. Encouraged adequate hydration.  - nitrofurantoin, macrocrystal-monohydrate, (MACROBID) 100 MG capsule; Take 1 capsule (100 mg total) by mouth 2 (two) times daily for 5 days.  Dispense: 10 capsule; Refill: 0  11. Colon cancer screening - Cologuard   Return in about 3 months (around 04/06/2021) for follow-up of chronic medication conditions.  Sherry Limes, MSN, APRN, FNP-C [facility]  Subjective:    Patient ID: Sherry Salinas, female    DOB: March 21, 1956, 65 y.o.   MRN: [id number]  Patient Care Team: Sherry Brooklyn, FNP as PCP - General (Family Medicine) Sherry Romney Cristopher Estimable, MD as Consulting Physician (Gastroenterology)   Chief Complaint:  Chief Complaint  Patient presents with  . Anxiety  . Hypertension    3 month follow up of chronic medical conditions   . Ear Pain    Left x 3 days  . Dysuria    X 4 days     HPI: Sherry Salinas is a 65 y.o. female presenting on 01/07/2021 for Anxiety, Hypertension (3 month follow up of chronic medical conditions/), Ear Pain (Left x 3 days), and Dysuria (X 4 days/)  Hyperlipidemia: Patient is taking Zetia and tolerating it well.  Her 10-year ASCVD risk score is: 14.4%.  Hypertension: Patient states she ran out of her medication a week ago and has been taking her husband's which is kept her blood pressure controlled.  Pain assessment: Cause of pain-arthritis, right knee injury with surgery, left shoulder injury  falling off horse Pain location-right knee, left shoulder, hands/joints Pain on scale of 1-10-6-7/10 at night when trying to get comfortable Frequency-daily What increases pain-lying down What makes painbetter-using it, Tramadol Effects on ADL -unable to do things she use too Any change in general medical condition-No  Current opioids rx-Tramadol 50 mg BID  PRN # meds [id number] Effectiveness of current meds-effective Adverse reactions form pain meds-none Morphine equivalent-10 MME/day  Pill count performed-No Last drug screen - 12/28/2019 ( high risk q44m, moderate risk q45m, low risk yearly ) Urine drug screen today- Yes Was the [place] reviewed- Yes  If yes were their any concerning findings? - No  Overdose risk: 390  Opioid Risk  04/07/2020  Alcohol 0  Illegal Drugs 0  Rx Drugs 0  Alcohol 0  Illegal Drugs 0  Rx Drugs 0  Age between 16-45 years  1  History of Preadolescent Sexual Abuse 0  Psychological Disease 0  Depression 0  Opioid Risk Tool Scoring 1  Opioid Risk Interpretation Low Risk   Pain contract signed on: 01/07/2021  New complaints: Urinary Tract Infection: Patient complains of dysuria, frequency and suprapubic pressure She has had symptoms for 4 days. Patient also complains of fever.  Patient does have a history of previous UTI.  Patient does not have a history of pyelonephritis.    Social history:  Relevant past medical, surgical, family and social history reviewed and updated as indicated. Interim medical history since our last visit reviewed.  Allergies and medications reviewed and updated.  DATA REVIEWED: CHART IN EPIC  ROS: Negative unless specifically indicated above in HPI.    Current Outpatient Medications:  .  busPIRone (BUSPAR) 7.5 MG tablet, Take 1 tablet (7.5 mg total) by mouth 2 (two) times daily., Disp: 180 tablet, Rfl: 1 .  dicyclomine (BENTYL) 20 MG tablet, Take 1 tablet (20 mg total) by mouth 3 (three) times daily., Disp: 270 tablet, Rfl: 1 .  EPINEPHrine 0.3 mg/0.3 mL IJ SOAJ injection, Inject 0.3 mLs (0.3 mg total) into the muscle as needed for anaphylaxis., Disp: 1 each, Rfl: 3 .  ezetimibe (ZETIA) 10 MG tablet, Take 1 tablet (10 mg total) by mouth daily., Disp: 90 tablet, Rfl: 1 .  hydrochlorothiazide (MICROZIDE) 12.5 MG capsule, Take 1 capsule (12.5 mg total) by mouth daily., Disp: 90  capsule, Rfl: 1 .  levocetirizine (XYZAL) 5 MG tablet, Take 1 tablet (5 mg total) by mouth every evening., Disp: 90 tablet, Rfl: 2 .  metoprolol succinate (TOPROL-XL) 50 MG 24 hr tablet, Take 1 tablet (50 mg total) by mouth daily. Take with or immediately following a meal., Disp: 90 tablet, Rfl: 1 .  ondansetron (ZOFRAN) 4 MG tablet, Take 1 tablet (4 mg total) by mouth every 8 (eight) hours as needed for nausea or vomiting., Disp: 30 tablet, Rfl: 1 .  pantoprazole (PROTONIX) 40 MG tablet, Take 1 tablet (40 mg total) by mouth daily. 30 minutes before breakfast daily, Disp: 90 tablet, Rfl: 1 .  traMADol (ULTRAM) 50 MG tablet, Take 1 tablet (50 mg total) by mouth 2 (two) times daily as needed., Disp: 60 tablet, Rfl: 2 .  triamcinolone cream (KENALOG) 0.1 %, Apply topically 2 (two) times daily as needed., Disp: 30 g, Rfl: 2   Allergies  Allergen Reactions  . Bee Venom Anaphylaxis  . Codeine Anaphylaxis  . Other Other (See Comments)    Muscle aches  . Statins Other (See Comments)    Muscle aches  . Sulfa Antibiotics Rash  . Sulfasalazine  Rash   Past Medical History:  Diagnosis Date  . Anxiety   . Arthritis   . Hyperlipidemia   . Hypertension   . IBS (irritable bowel syndrome)   . Vitamin D deficiency 09/14/2017    Past Surgical History:  Procedure Laterality Date  . COLONOSCOPY  2015   Dr. Amedeo Plenty: per pt, diverticulosis, 3 polyps, come back in five years.   . COLONOSCOPY WITH ESOPHAGOGASTRODUODENOSCOPY (EGD)  02/2003   Dr. Amedeo Plenty: Grade 3 esophagitis, hiatal hernia with patulous GE junction, mild duodenitis.  CLOtest, results unavailable.  Colonoscopy normal.  . REPLACEMENT TOTAL KNEE Right 2012  . ROTATOR CUFF REPAIR Right     Social History   Socioeconomic History  . Marital status: Married    Spouse name: Not on file  . Number of children: Not on file  . Years of education: Not on file  . Highest education level: Not on file  Occupational History  . Not on file  Tobacco Use   . Smoking status: Current Every Day Smoker    Packs/day: 1.00    Types: Cigarettes  . Smokeless tobacco: Never Used  Vaping Use  . Vaping Use: Never used  Substance and Sexual Activity  . Alcohol use: Never  . Drug use: Never  . Sexual activity: Not on file  Other Topics Concern  . Not on file  Social History Narrative  . Not on file   Social Determinants of Health   Financial Resource Strain: Not on file  Food Insecurity: Not on file  Transportation Needs: Not on file  Physical Activity: Not on file  Stress: Not on file  Social Connections: Not on file  Intimate Partner Violence: Not on file        Objective:    BP 125/75   Pulse 73   Temp 97.8 F (36.6 C) (Temporal)   Ht $R'5\' 3"'gE$  (1.6 m)   Wt 148 lb (67.1 kg)   SpO2 98%   BMI 26.22 kg/m   Wt Readings from Last 3 Encounters:  01/07/21 148 lb (67.1 kg)  10/06/20 149 lb (67.6 kg)  07/03/20 146 lb 12.8 oz (66.6 kg)    Physical Exam Vitals reviewed.  Constitutional:      General: She is not in acute distress.    Appearance: Normal appearance. She is overweight. She is not ill-appearing, toxic-appearing or diaphoretic.  HENT:     Head: Normocephalic and atraumatic.  Eyes:     General: No scleral icterus.       Right eye: No discharge.        Left eye: No discharge.     Conjunctiva/sclera: Conjunctivae normal.  Cardiovascular:     Rate and Rhythm: Normal rate and regular rhythm.     Heart sounds: Normal heart sounds. No murmur heard. No friction rub. No gallop.   Pulmonary:     Effort: Pulmonary effort is normal. No respiratory distress.     Breath sounds: Normal breath sounds. No stridor. No wheezing, rhonchi or rales.  Musculoskeletal:        General: Normal range of motion.     Cervical back: Normal range of motion.  Skin:    General: Skin is warm and dry.     Capillary Refill: Capillary refill takes less than 2 seconds.  Neurological:     General: No focal deficit present.     Mental Status: She  is alert and oriented to person, place, and time. Mental status is at baseline.  Psychiatric:  Mood and Affect: Mood normal.        Behavior: Behavior normal.        Thought Content: Thought content normal.        Judgment: Judgment normal.     Lab Results  Component Value Date   TSH 1.990 01/11/2020   Lab Results  Component Value Date   WBC 9.1 08/12/2020   HGB 14.4 08/12/2020   HCT 43.4 08/12/2020   MCV 98 (H) 08/12/2020   PLT 230 08/12/2020   Lab Results  Component Value Date   NA 142 07/03/2020   K 3.9 07/03/2020   CO2 26 07/03/2020   GLUCOSE 106 (H) 07/03/2020   BUN 15 07/03/2020   CREATININE 0.87 07/03/2020   BILITOT <0.2 07/03/2020   ALKPHOS 74 07/03/2020   AST 17 07/03/2020   ALT 18 07/03/2020   PROT 6.2 07/03/2020   ALBUMIN 3.8 07/03/2020   CALCIUM 9.7 07/03/2020   Lab Results  Component Value Date   CHOL 190 10/06/2020   Lab Results  Component Value Date   HDL 39 (L) 10/06/2020   Lab Results  Component Value Date   LDLCALC 116 (H) 10/06/2020   Lab Results  Component Value Date   TRIG 198 (H) 10/06/2020   Lab Results  Component Value Date   CHOLHDL 4.9 (H) 10/06/2020   No results found for: HGBA1C

## 2021-01-07 NOTE — Patient Instructions (Signed)
Urinary Tract Infection, Adult A urinary tract infection (UTI) is an infection of any part of the urinary tract. The urinary tract includes:  The kidneys.  The ureters.  The bladder.  The urethra. These organs make, store, and get rid of pee (urine) in the body. What are the causes? This infection is caused by germs (bacteria) in your genital area. These germs grow and cause swelling (inflammation) of your urinary tract. What increases the risk? The following factors may make you more likely to develop this condition:  Using a small, thin tube (catheter) to drain pee.  Not being able to control when you pee or poop (incontinence).  Being female. If you are female, these things can increase the risk: ? Using these methods to prevent pregnancy:  A medicine that kills sperm (spermicide).  A device that blocks sperm (diaphragm). ? Having low levels of a female hormone (estrogen). ? Being pregnant. You are more likely to develop this condition if:  You have genes that add to your risk.  You are sexually active.  You take antibiotic medicines.  You have trouble peeing because of: ? A prostate that is bigger than normal, if you are female. ? A blockage in the part of your body that drains pee from the bladder. ? A kidney stone. ? A nerve condition that affects your bladder. ? Not getting enough to drink. ? Not peeing often enough.  You have other conditions, such as: ? Diabetes. ? A weak disease-fighting system (immune system). ? Sickle cell disease. ? Gout. ? Injury of the spine. What are the signs or symptoms? Symptoms of this condition include:  Needing to pee right away.  Peeing small amounts often.  Pain or burning when peeing.  Blood in the pee.  Pee that smells bad or not like normal.  Trouble peeing.  Pee that is cloudy.  Fluid coming from the vagina, if you are female.  Pain in the belly or lower back. Other symptoms include:  Vomiting.  Not  feeling hungry.  Feeling mixed up (confused). This may be the first symptom in older adults.  Being tired and grouchy (irritable).  A fever.  Watery poop (diarrhea). How is this treated?  Taking antibiotic medicine.  Taking other medicines.  Drinking enough water. In some cases, you may need to see a specialist. Follow these instructions at home: Medicines  Take over-the-counter and prescription medicines only as told by your doctor.  If you were prescribed an antibiotic medicine, take it as told by your doctor. Do not stop taking it even if you start to feel better. General instructions  Make sure you: ? Pee until your bladder is empty. ? Do not hold pee for a long time. ? Empty your bladder after sex. ? Wipe from front to back after peeing or pooping if you are a female. Use each tissue one time when you wipe.  Drink enough fluid to keep your pee pale yellow.  Keep all follow-up visits.   Contact a doctor if:  You do not get better after 1-2 days.  Your symptoms go away and then come back. Get help right away if:  You have very bad back pain.  You have very bad pain in your lower belly.  You have a fever.  You have chills.  You feeling like you will vomit or you vomit. Summary  A urinary tract infection (UTI) is an infection of any part of the urinary tract.  This condition is caused by   germs in your genital area.  There are many risk factors for a UTI.  Treatment includes antibiotic medicines.  Drink enough fluid to keep your pee pale yellow. This information is not intended to replace advice given to you by your health care provider. Make sure you discuss any questions you have with your health care provider. Document Revised: 07/04/2020 Document Reviewed: 07/04/2020 Elsevier Patient Education  2021 [employer]vier Inc.  

## 2021-01-08 ENCOUNTER — Encounter: Payer: Self-pay | Admitting: Family Medicine

## 2021-01-08 DIAGNOSIS — M8580 Other specified disorders of bone density and structure, unspecified site: Secondary | ICD-10-CM

## 2021-01-08 HISTORY — DX: Other specified disorders of bone density and structure, unspecified site: M85.80

## 2021-01-09 LAB — URINE CULTURE

## 2021-01-13 ENCOUNTER — Ambulatory Visit: Payer: Medicare HMO

## 2021-01-14 LAB — TOXASSURE SELECT 13 (MW), URINE

## 2021-01-27 ENCOUNTER — Ambulatory Visit: Payer: Medicare HMO

## 2021-02-03 ENCOUNTER — Ambulatory Visit (INDEPENDENT_AMBULATORY_CARE_PROVIDER_SITE_OTHER): Payer: Medicare HMO | Admitting: *Deleted

## 2021-02-03 DIAGNOSIS — Z0000 Encounter for general adult medical examination without abnormal findings: Secondary | ICD-10-CM | POA: Diagnosis not present

## 2021-02-03 DIAGNOSIS — Z1211 Encounter for screening for malignant neoplasm of colon: Secondary | ICD-10-CM | POA: Diagnosis not present

## 2021-02-03 NOTE — Patient Instructions (Addendum)
[facility] Maintenance Summary and Written Plan of Care  Sherry Salinas ,  Thank you for allowing me to perform your Medicare Annual Wellness Visit and for your ongoing commitment to your health.   Health Maintenance & Immunization History Health Maintenance  Topic Date Due  . Fecal DNA (Cologuard)  Never done  . COVID-19 Vaccine (3 - Booster for Pfizer series) 02/19/2021 (Originally 01/31/2021)  . PNA vac Low Risk Adult (1 of 2 - PCV13) 07/03/2021 (Originally 05/12/2020)  . DEXA SCAN  01/07/2023  . TETANUS/TDAP  10/21/2026  . INFLUENZA VACCINE  Completed  . Hepatitis C Screening  Completed  . HIV Screening  Completed  . HPV VACCINES  Aged Out   Immunization History  Administered Date(s) Administered  . Influenza Split 08/29/2015  . Influenza, Seasonal, Injecte, Preservative Fre 09/24/2011, 08/13/2013, 01/27/2015, 09/13/2018  . Influenza,inj,Quad PF,6+ Mos 09/13/2018, 10/28/2019, 10/06/2020  . Influenza,inj,quad, With Preservative 10/21/2016, 09/14/2017  . Influenza,trivalent, recombinat, inj, PF 09/24/2011, 08/13/2013, 01/27/2015  . Influenza-Unspecified 09/13/2018  . PFIZER(Purple Top)SARS-COV-2 Vaccination 07/10/2020, 07/31/2020  . PPD Test 11/12/2013, 01/27/2015  . Tdap 12/06/2006, 10/21/2016    These are the patient goals that we discussed: Goals Addressed            This Visit's Progress   . AWV       02/03/2021 AWV Goal: Exercise for General Health   Patient will verbalize understanding of the benefits of increased physical activity:  Exercising regularly is important. It will improve your overall fitness, flexibility, and endurance.  Regular exercise also will improve your overall health. It can help you control your weight, reduce stress, and improve your bone density.  Over the next year, patient will increase physical activity as tolerated with a goal of at least 150 minutes of moderate physical activity per week.   You can tell that  you are exercising at a moderate intensity if your heart starts beating faster and you start breathing faster but can still hold a conversation.  Moderate-intensity exercise ideas include:  Walking 1 mile (1.6 km) in about 15 minutes  Biking  Hiking  Golfing  Dancing  Water aerobics  Patient will verbalize understanding of everyday activities that increase physical activity by providing examples like the following: ? Yard work, such as: ? Pushing a Conservation officer, nature ? Raking and bagging leaves ? Washing your car ? Pushing a stroller ? Shoveling snow ? Gardening ? Washing windows or floors  Patient will be able to explain general safety guidelines for exercising:   Before you start a new exercise program, talk with your health care provider.  Do not exercise so much that you hurt yourself, feel dizzy, or get very short of breath.  Wear comfortable clothes and wear shoes with good support.  Drink plenty of water while you exercise to prevent dehydration or heat stroke.  Work out until your breathing and your heartbeat get faster.         This is a list of Health Maintenance Items that are overdue or due now: Health Maintenance Due  Topic Date Due  . Fecal DNA (Cologuard)  Never done     Orders/Referrals Placed Today: No orders of the defined types were placed in this encounter.  (Contact our referral department at [phone] if you have not spoken with someone about your referral appointment within the next 5 days)    Follow-up Plan Follow-up with Loman Brooklyn, FNP as planned

## 2021-02-03 NOTE — Progress Notes (Signed)
MEDICARE ANNUAL WELLNESS VISIT  02/03/2021  Telephone Visit Disclaimer This Medicare AWV was conducted by telephone due to national recommendations for restrictions regarding the COVID-19 Pandemic (e.g. social distancing).  I verified, using two identifiers, that I am speaking with Sherry Salinas or their authorized healthcare agent. I discussed the limitations, risks, security, and privacy concerns of performing an evaluation and management service by telephone and the potential availability of an in-person appointment in the future. The patient expressed understanding and agreed to proceed.  Location of Patient: Home  Location of Provider (nurse):  [facility]  Subjective:    Sherry Salinas is a 65 y.o. female patient of Sherry Brooklyn, FNP who had a Medicare Annual Wellness Visit today via telephone. Sherry Salinas is Retired and lives with her husband. She has one son and granddaughter living with them. she has 2 children. she reports that she is socially active and does interact with friends/family regularly. she is minimally physically active and enjoys fishing, swimming, gardening, spending time on the river, and spending time with her horse.  Patient Care Team: Sherry Brooklyn, FNP as PCP - General (Family Medicine) Sherry Romney Cristopher Estimable, MD as Consulting Physician (Gastroenterology)  Advanced Directives 02/03/2021  Does Patient Have a Medical Advance Directive? Yes  Type of Advance Directive Living will;Healthcare Power of Attorney  Does patient want to make changes to medical advance directive? No - Patient declined  Copy of [place] in Chart? No - copy requested    Hospital Utilization Over the Past 12 Months: # of hospitalizations or ER visits: 0 # of surgeries: 0  Review of Systems    Patient reports that her overall health is better compared to last year.  History obtained from chart review and the patient  Patient Reported  Readings (BP, Pulse, CBG, Weight, etc) none  Pain Assessment Pain : No/denies pain     Current Medications & Allergies (verified) Allergies as of 02/03/2021      Reactions   Bee Venom Anaphylaxis   Codeine Anaphylaxis   Other Other (See Comments)   Muscle aches   Statins Other (See Comments)   Muscle aches   Sulfa Antibiotics Rash   Sulfasalazine Rash      Medication List       Accurate as of February 03, 2021 11:01 AM. If you have any questions, ask your nurse or doctor.        busPIRone 7.5 MG tablet Commonly known as: BUSPAR Take 1 tablet (7.5 mg total) by mouth 2 (two) times daily.   dicyclomine 20 MG tablet Commonly known as: BENTYL Take 1 tablet (20 mg total) by mouth 3 (three) times daily.   EPINEPHrine 0.3 mg/0.3 mL Soaj injection Commonly known as: EPI-PEN Inject 0.3 mLs (0.3 mg total) into the muscle as needed for anaphylaxis.   ezetimibe 10 MG tablet Commonly known as: ZETIA Take 1 tablet (10 mg total) by mouth daily.   hydrochlorothiazide 12.5 MG capsule Commonly known as: MICROZIDE Take 1 capsule (12.5 mg total) by mouth daily.   levocetirizine 5 MG tablet Commonly known as: XYZAL Take 1 tablet (5 mg total) by mouth every evening.   metoprolol succinate 50 MG 24 hr tablet Commonly known as: TOPROL-XL Take 1 tablet (50 mg total) by mouth daily. Take with or immediately following a meal.   ondansetron 4 MG tablet Commonly known as: ZOFRAN Take 1 tablet (4 mg total) by mouth every 8 (eight) hours as  needed for nausea or vomiting.   pantoprazole 40 MG tablet Commonly known as: PROTONIX Take 1 tablet (40 mg total) by mouth daily. 30 minutes before breakfast daily   traMADol 50 MG tablet Commonly known as: ULTRAM Take 1 tablet (50 mg total) by mouth 2 (two) times daily as needed.   triamcinolone 0.1 % Commonly known as: KENALOG Apply topically 2 (two) times daily as needed.       History (reviewed): Past Medical History:  Diagnosis Date   . Anxiety   . Arthritis   . Hyperlipidemia   . Hypertension   . IBS (irritable bowel syndrome)   . Osteopenia 01/08/2021  . Vitamin D deficiency 09/14/2017   Past Surgical History:  Procedure Laterality Date  . COLONOSCOPY  2015   Dr. Amedeo Plenty: per pt, diverticulosis, 3 polyps, come back in five years.   . COLONOSCOPY WITH ESOPHAGOGASTRODUODENOSCOPY (EGD)  02/2003   Dr. Amedeo Plenty: Grade 3 esophagitis, hiatal hernia with patulous GE junction, mild duodenitis.  CLOtest, results unavailable.  Colonoscopy normal.  . REPLACEMENT TOTAL KNEE Right 2012  . ROTATOR CUFF REPAIR Right    Family History  Problem Relation Age of Onset  . Arthritis Mother   . Kidney failure Mother   . Rheum arthritis Mother   . Stroke Father   . Heart disease Father   . Heart attack Father   . Stroke Maternal Grandfather   . Early death Paternal Grandmother        Childbirth  . Early death Paternal Grandfather        MVA  . Colon cancer Paternal Uncle    Social History   Socioeconomic History  . Marital status: Married    Spouse name: Not on file  . Number of children: 2  . Years of education: Not on file  . Highest education level: Associate degree: academic program  Occupational History  . Not on file  Tobacco Use  . Smoking status: Current Every Day Smoker    Packs/day: 1.00    Years: 30.00    Pack years: 30.00    Types: Cigarettes  . Smokeless tobacco: Never Used  Vaping Use  . Vaping Use: Never used  Substance and Sexual Activity  . Alcohol use: Never  . Drug use: Never  . Sexual activity: Not on file  Other Topics Concern  . Not on file  Social History Narrative   Lives with husband, son and granddaughter    Social Determinants of Health   Financial Resource Strain: Not on file  Food Insecurity: Not on file  Transportation Needs: Not on file  Physical Activity: Not on file  Stress: Not on file  Social Connections: Not on file    Activities of Daily Living In your present  state of health, do you have any difficulty performing the following activities: 02/03/2021  Hearing? N  Vision? N  Comment wears glasses  Difficulty concentrating or making decisions? N  Walking or climbing stairs? Y  Comment Comind down stairs due to knee replacement  Dressing or bathing? N  Doing errands, shopping? N  Preparing Food and eating ? N  Using the Toilet? N  In the past six months, have you accidently leaked urine? N  Do you have problems with loss of bowel control? N  Managing your Medications? N  Managing your Finances? N  Housekeeping or managing your Housekeeping? N  Some recent data might be hidden    Patient Education/ Literacy How often do you need to have  someone help you when you read instructions, pamphlets, or other written materials from your doctor or pharmacy?: 1 - Never What is the last grade level you completed in school?: 2 years of college  Exercise Current Exercise Habits: The patient does not participate in regular exercise at present  Diet Patient reports consuming 3 meals a day and 1 snack(s) a day Patient reports that her primary diet is: Regular Patient reports that she does have regular access to food.   Depression Screen PHQ 2/9 Scores 02/03/2021 01/07/2021 10/06/2020 07/06/2020 04/07/2020 02/23/2020 01/11/2020  PHQ - 2 Score 1 2 0 3 2 6 4   PHQ- 9 Score 3 6 - 10 9 15 9      Fall Risk Fall Risk  02/03/2021 01/07/2021 10/06/2020 04/03/2020 02/22/2020  Falls in the past year? 0 0 0 0 0     Objective:  Sherry Salinas seemed alert and oriented and she participated appropriately during our telephone visit.  Blood Pressure Weight BMI  BP Readings from Last 3 Encounters:  01/07/21 125/75  10/06/20 125/67  07/03/20 123/74   Wt Readings from Last 3 Encounters:  01/07/21 148 lb (67.1 kg)  10/06/20 149 lb (67.6 kg)  07/03/20 146 lb 12.8 oz (66.6 kg)   BMI Readings from Last 1 Encounters:  01/07/21 26.22 kg/m    *Unable to obtain current vital signs,  weight, and BMI due to telephone visit type  Hearing/Vision  . Rayna did not seem to have difficulty with hearing/understanding during the telephone conversation . Reports that she has had a formal eye exam by an eye care professional within the past year . Reports that she has not had a formal hearing evaluation within the past year *Unable to fully assess hearing and vision during telephone visit type  Cognitive Function: 6CIT Screen 02/03/2021  What Year? 0 points  What month? 0 points  What time? 0 points  Count back from 20 0 points  Months in reverse 0 points  Repeat phrase 0 points  Total Score 0   (Normal:0-7, Significant for Dysfunction: >8)  Normal Cognitive Function Screening: Yes   Immunization & Health Maintenance Record Immunization History  Administered Date(s) Administered  . Influenza Split 08/29/2015  . Influenza, Seasonal, Injecte, Preservative Fre 09/24/2011, 08/13/2013, 01/27/2015, 09/13/2018  . Influenza,inj,Quad PF,6+ Mos 09/13/2018, 10/28/2019, 10/06/2020  . Influenza,inj,quad, With Preservative 10/21/2016, 09/14/2017  . Influenza,trivalent, recombinat, inj, PF 09/24/2011, 08/13/2013, 01/27/2015  . Influenza-Unspecified 09/13/2018  . PFIZER(Purple Top)SARS-COV-2 Vaccination 07/10/2020, 07/31/2020  . PPD Test 11/12/2013, 01/27/2015  . Tdap 12/06/2006, 10/21/2016    Health Maintenance  Topic Date Due  . Fecal DNA (Cologuard)  Never done  . COVID-19 Vaccine (3 - Booster for Pfizer series) 02/19/2021 (Originally 01/31/2021)  . PNA vac Low Risk Adult (1 of 2 - PCV13) 07/03/2021 (Originally 05/12/2020)  . DEXA SCAN  01/07/2023  . TETANUS/TDAP  10/21/2026  . INFLUENZA VACCINE  Completed  . Hepatitis C Screening  Completed  . HIV Screening  Completed  . HPV VACCINES  Aged Out       Assessment  This is a routine wellness examination for JHAYLA PODGORSKI.  Health Maintenance: Due or Overdue Health Maintenance Due  Topic Date Due  . Fecal DNA  (Cologuard)  Never done    Sherry Salinas does not need a referral for Community Assistance: Care Management:   no Social Work:    no Prescription Assistance:  no Nutrition/Diabetes Education:  no   Plan:  Personalized Goals Goals Addressed  This Visit's Progress   . AWV       02/03/2021 AWV Goal: Exercise for General Health   Patient will verbalize understanding of the benefits of increased physical activity:  Exercising regularly is important. It will improve your overall fitness, flexibility, and endurance.  Regular exercise also will improve your overall health. It can help you control your weight, reduce stress, and improve your bone density.  Over the next year, patient will increase physical activity as tolerated with a goal of at least 150 minutes of moderate physical activity per week.   You can tell that you are exercising at a moderate intensity if your heart starts beating faster and you start breathing faster but can still hold a conversation.  Moderate-intensity exercise ideas include:  Walking 1 mile (1.6 km) in about 15 minutes  Biking  Hiking  Golfing  Dancing  Water aerobics  Patient will verbalize understanding of everyday activities that increase physical activity by providing examples like the following: ? Yard work, such as: ? Pushing a Conservation officer, nature ? Raking and bagging leaves ? Washing your car ? Pushing a stroller ? Shoveling snow ? Gardening ? Washing windows or floors  Patient will be able to explain general safety guidelines for exercising:   Before you start a new exercise program, talk with your health care provider.  Do not exercise so much that you hurt yourself, feel dizzy, or get very short of breath.  Wear comfortable clothes and wear shoes with good support.  Drink plenty of water while you exercise to prevent dehydration or heat stroke.  Work out until your breathing and your heartbeat get faster.        Personalized Health Maintenance & Screening Recommendations  Colorectal cancer screening- Patient just completed cologuard and will mail back today  Lung Cancer Screening Recommended: yes- will have provider discuss with patient  (Low Dose CT Chest recommended if Age 37-80 years, 30 pack-year currently smoking OR have quit w/in past 15 years) Hepatitis C Screening recommended: no HIV Screening recommended: no  Advanced Directives: Written information was not prepared per patient's request.  Referrals & Orders No orders of the defined types were placed in this encounter.   Follow-up Plan . Follow-up with Sherry Brooklyn, FNP as planned . AVS printed and mailed to patient   I have personally reviewed and noted the following in the patient's chart:   . Medical and social history . Use of alcohol, tobacco or illicit drugs  . Current medications and supplements . Functional ability and status . Nutritional status . Physical activity . Advanced directives . List of other physicians . Hospitalizations, surgeries, and ER visits in previous 12 months . Vitals . Screenings to include cognitive, depression, and falls . Referrals and appointments  In addition, I have reviewed and discussed with Sherry Salinas certain preventive protocols, quality metrics, and best practice recommendations. A written personalized care plan for preventive services as well as general preventive health recommendations is available and can be mailed to the patient at her request.      Lynnea Ferrier, LPN  07/14/2118

## 2021-02-06 ENCOUNTER — Encounter: Payer: Self-pay | Admitting: Family Medicine

## 2021-02-06 ENCOUNTER — Ambulatory Visit (INDEPENDENT_AMBULATORY_CARE_PROVIDER_SITE_OTHER): Payer: Medicare HMO

## 2021-02-06 ENCOUNTER — Ambulatory Visit (INDEPENDENT_AMBULATORY_CARE_PROVIDER_SITE_OTHER): Payer: Medicare HMO | Admitting: Family Medicine

## 2021-02-06 ENCOUNTER — Other Ambulatory Visit: Payer: Self-pay

## 2021-02-06 VITALS — BP 142/82 | HR 64 | Temp 97.7°F | Ht 63.0 in | Wt 147.2 lb

## 2021-02-06 DIAGNOSIS — I10 Essential (primary) hypertension: Secondary | ICD-10-CM | POA: Diagnosis not present

## 2021-02-06 DIAGNOSIS — K219 Gastro-esophageal reflux disease without esophagitis: Secondary | ICD-10-CM

## 2021-02-06 DIAGNOSIS — G8929 Other chronic pain: Secondary | ICD-10-CM

## 2021-02-06 DIAGNOSIS — M25561 Pain in right knee: Secondary | ICD-10-CM | POA: Diagnosis not present

## 2021-02-06 DIAGNOSIS — E782 Mixed hyperlipidemia: Secondary | ICD-10-CM

## 2021-02-06 DIAGNOSIS — M25512 Pain in left shoulder: Secondary | ICD-10-CM | POA: Diagnosis not present

## 2021-02-06 DIAGNOSIS — F411 Generalized anxiety disorder: Secondary | ICD-10-CM | POA: Diagnosis not present

## 2021-02-06 DIAGNOSIS — K589 Irritable bowel syndrome without diarrhea: Secondary | ICD-10-CM

## 2021-02-06 DIAGNOSIS — H60542 Acute eczematoid otitis externa, left ear: Secondary | ICD-10-CM

## 2021-02-06 DIAGNOSIS — M25551 Pain in right hip: Secondary | ICD-10-CM

## 2021-02-06 LAB — CBC WITH DIFFERENTIAL/PLATELET
Basophils Absolute: 0 10*3/uL (ref 0.0–0.2)
Basos: 0 %
EOS (ABSOLUTE): 0.2 10*3/uL (ref 0.0–0.4)
Eos: 2 %
Hematocrit: 41.2 % (ref 34.0–46.6)
Hemoglobin: 14.1 g/dL (ref 11.1–15.9)
Immature Grans (Abs): 0 10*3/uL (ref 0.0–0.1)
Immature Granulocytes: 0 %
Lymphocytes Absolute: 2.4 10*3/uL (ref 0.7–3.1)
Lymphs: 30 %
MCH: 32.5 pg (ref 26.6–33.0)
MCHC: 34.2 g/dL (ref 31.5–35.7)
MCV: 95 fL (ref 79–97)
Monocytes Absolute: 1.1 10*3/uL — ABNORMAL HIGH (ref 0.1–0.9)
Monocytes: 13 %
Neutrophils Absolute: 4.2 10*3/uL (ref 1.4–7.0)
Neutrophils: 55 %
Platelets: 227 10*3/uL (ref 150–450)
RBC: 4.34 x10E6/uL (ref 3.77–5.28)
RDW: 11.4 % — ABNORMAL LOW (ref 11.7–15.4)
WBC: 7.9 10*3/uL (ref 3.4–10.8)

## 2021-02-06 LAB — CMP14+EGFR
ALT: 25 IU/L (ref 0–32)
AST: 24 IU/L (ref 0–40)
Albumin/Globulin Ratio: 2 (ref 1.2–2.2)
Albumin: 4.3 g/dL (ref 3.8–4.8)
Alkaline Phosphatase: 76 IU/L (ref 44–121)
BUN/Creatinine Ratio: 18 (ref 12–28)
BUN: 17 mg/dL (ref 8–27)
Bilirubin Total: 0.5 mg/dL (ref 0.0–1.2)
CO2: 21 mmol/L (ref 20–29)
Calcium: 9.7 mg/dL (ref 8.7–10.3)
Chloride: 101 mmol/L (ref 96–106)
Creatinine, Ser: 0.97 mg/dL (ref 0.57–1.00)
Globulin, Total: 2.1 g/dL (ref 1.5–4.5)
Glucose: 90 mg/dL (ref 65–99)
Potassium: 4.1 mmol/L (ref 3.5–5.2)
Sodium: 140 mmol/L (ref 134–144)
Total Protein: 6.4 g/dL (ref 6.0–8.5)
eGFR: 65 mL/min/{1.73_m2} (ref >59)

## 2021-02-06 MED ORDER — CELECOXIB 100 MG PO CAPS: 100.0000 mg | ORAL_CAPSULE | Freq: Two times a day (BID) | ORAL | 1 refills | Status: DC

## 2021-02-06 MED ORDER — TRIAMCINOLONE ACETONIDE 0.1 % EX CREA: TOPICAL_CREAM | Freq: Two times a day (BID) | CUTANEOUS | 2 refills | Status: DC | PRN

## 2021-02-06 NOTE — Progress Notes (Signed)
Assessment & Plan:  1. Chronic right hip pain Discussed with patient she can take her tramadol twice daily if needed.  Celebrex Rx for her to try.  Will obtain x-ray since she has not had one of this hip. - celecoxib (CELEBREX) 100 MG capsule; Take 1 capsule (100 mg total) by mouth 2 (two) times daily.  Dispense: 180 capsule; Refill: 1 - DG HIP UNILAT W OR W/O PELVIS 2-3 VIEWS RIGHT   Follow up plan: Return as scheduled.  Hendricks Limes, MSN, APRN, FNP-C [facility]  Subjective:   Patient ID: Sherry Salinas, female    DOB: Nov 02, 1956, 65 y.o.   MRN: [id number]  HPI: Sherry Salinas is a 65 y.o. female presenting on 02/06/2021 for Hip Pain (Patient states she has been having right hip pain x 1 year on and off. )  Patient reports right hip pain that has been on and off for the past year.  She describes the pain as burning.  When her hip is acting up she has increased leg cramps at night.  She has a prescription of tramadol which she only takes at night.  It is somewhat helpful for her hip pain.  She had an old prescription of diclofenac which she took last night, which was effective.  States she is unable to take this more than a day without it upsetting her stomach.  She does take it with food and she also has a prescription of Protonix that she takes daily.  Reports she took Celebrex in the past which she tolerated well.   ROS: Negative unless specifically indicated above in HPI.   Relevant past medical history reviewed and updated as indicated.   Allergies and medications reviewed and updated.   Current Outpatient Medications:  .  busPIRone (BUSPAR) 7.5 MG tablet, Take 1 tablet (7.5 mg total) by mouth 2 (two) times daily., Disp: 180 tablet, Rfl: 1 .  dicyclomine (BENTYL) 20 MG tablet, Take 1 tablet (20 mg total) by mouth 3 (three) times daily., Disp: 270 tablet, Rfl: 1 .  EPINEPHrine 0.3 mg/0.3 mL IJ SOAJ injection, Inject 0.3 mLs (0.3 mg total) into the muscle  as needed for anaphylaxis., Disp: 1 each, Rfl: 3 .  ezetimibe (ZETIA) 10 MG tablet, Take 1 tablet (10 mg total) by mouth daily., Disp: 90 tablet, Rfl: 1 .  hydrochlorothiazide (MICROZIDE) 12.5 MG capsule, Take 1 capsule (12.5 mg total) by mouth daily., Disp: 90 capsule, Rfl: 1 .  levocetirizine (XYZAL) 5 MG tablet, Take 1 tablet (5 mg total) by mouth every evening., Disp: 90 tablet, Rfl: 2 .  metoprolol succinate (TOPROL-XL) 50 MG 24 hr tablet, Take 1 tablet (50 mg total) by mouth daily. Take with or immediately following a meal., Disp: 90 tablet, Rfl: 1 .  ondansetron (ZOFRAN) 4 MG tablet, Take 1 tablet (4 mg total) by mouth every 8 (eight) hours as needed for nausea or vomiting., Disp: 30 tablet, Rfl: 1 .  pantoprazole (PROTONIX) 40 MG tablet, Take 1 tablet (40 mg total) by mouth daily. 30 minutes before breakfast daily, Disp: 90 tablet, Rfl: 1 .  traMADol (ULTRAM) 50 MG tablet, Take 1 tablet (50 mg total) by mouth 2 (two) times daily as needed., Disp: 60 tablet, Rfl: 2 .  triamcinolone cream (KENALOG) 0.1 %, Apply topically 2 (two) times daily as needed., Disp: 30 g, Rfl: 2  Allergies  Allergen Reactions  . Bee Venom Anaphylaxis  . Codeine Anaphylaxis  . Other Other (See Comments)  Muscle aches  . Statins Other (See Comments)    Muscle aches  . Sulfa Antibiotics Rash  . Sulfasalazine Rash    Objective:   BP (!) 142/82   Pulse 64   Temp 97.7 F (36.5 C) (Temporal)   Ht 5\' 3"  (1.6 m)   Wt 147 lb 3.2 oz (66.8 kg)   SpO2 96%   BMI 26.08 kg/m    Physical Exam Vitals reviewed.  Constitutional:      General: She is not in acute distress.    Appearance: Normal appearance. She is not ill-appearing, toxic-appearing or diaphoretic.  HENT:     Head: Normocephalic and atraumatic.  Eyes:     General: No scleral icterus.       Right eye: No discharge.        Left eye: No discharge.     Conjunctiva/sclera: Conjunctivae normal.  Cardiovascular:     Rate and Rhythm: Normal rate.   Pulmonary:     Effort: Pulmonary effort is normal. No respiratory distress.  Musculoskeletal:        General: Normal range of motion.     Cervical back: Normal range of motion.     Right hip: Tenderness present. Normal range of motion.  Skin:    General: Skin is warm and dry.     Capillary Refill: Capillary refill takes less than 2 seconds.  Neurological:     General: No focal deficit present.     Mental Status: She is alert and oriented to person, place, and time. Mental status is at baseline.  Psychiatric:        Mood and Affect: Mood normal.        Behavior: Behavior normal.        Thought Content: Thought content normal.        Judgment: Judgment normal.

## 2021-02-06 NOTE — Addendum Note (Signed)
Addended by: Reather Converse on: 02/06/2021 10:03 AM   Modules accepted: Orders

## 2021-02-07 LAB — LIPID PANEL
Chol/HDL Ratio: 4.8 ratio — ABNORMAL HIGH (ref 0.0–4.4)
Cholesterol, Total: 208 mg/dL — ABNORMAL HIGH (ref 100–199)
HDL: 43 mg/dL (ref >39)
LDL Chol Calc (NIH): 140 mg/dL — ABNORMAL HIGH (ref 0–99)
Triglycerides: 138 mg/dL (ref 0–149)
VLDL Cholesterol Cal: 25 mg/dL (ref 5–40)

## 2021-02-09 ENCOUNTER — Ambulatory Visit: Payer: Medicare HMO | Admitting: Family Medicine

## 2021-02-11 LAB — COLOGUARD: COLOGUARD: POSITIVE — AB

## 2021-02-13 ENCOUNTER — Encounter: Payer: Self-pay | Admitting: Family Medicine

## 2021-02-13 DIAGNOSIS — R195 Other fecal abnormalities: Secondary | ICD-10-CM | POA: Insufficient documentation

## 2021-02-13 LAB — COLOGUARD: Cologuard: POSITIVE — AB

## 2021-02-23 ENCOUNTER — Encounter: Payer: Self-pay | Admitting: *Deleted

## 2021-04-06 ENCOUNTER — Encounter: Payer: Self-pay | Admitting: Family Medicine

## 2021-04-06 ENCOUNTER — Other Ambulatory Visit: Payer: Self-pay

## 2021-04-06 ENCOUNTER — Telehealth: Payer: Self-pay

## 2021-04-06 ENCOUNTER — Ambulatory Visit (INDEPENDENT_AMBULATORY_CARE_PROVIDER_SITE_OTHER): Payer: Medicare HMO | Admitting: Family Medicine

## 2021-04-06 VITALS — BP 141/83 | HR 66 | Temp 98.2°F | Ht 63.0 in | Wt 147.4 lb

## 2021-04-06 DIAGNOSIS — R195 Other fecal abnormalities: Secondary | ICD-10-CM | POA: Diagnosis not present

## 2021-04-06 DIAGNOSIS — E782 Mixed hyperlipidemia: Secondary | ICD-10-CM

## 2021-04-06 DIAGNOSIS — F411 Generalized anxiety disorder: Secondary | ICD-10-CM | POA: Diagnosis not present

## 2021-04-06 DIAGNOSIS — I10 Essential (primary) hypertension: Secondary | ICD-10-CM | POA: Diagnosis not present

## 2021-04-06 DIAGNOSIS — J3089 Other allergic rhinitis: Secondary | ICD-10-CM

## 2021-04-06 DIAGNOSIS — M25561 Pain in right knee: Secondary | ICD-10-CM

## 2021-04-06 DIAGNOSIS — Z79899 Other long term (current) drug therapy: Secondary | ICD-10-CM

## 2021-04-06 DIAGNOSIS — G8929 Other chronic pain: Secondary | ICD-10-CM

## 2021-04-06 DIAGNOSIS — Z720 Tobacco use: Secondary | ICD-10-CM | POA: Diagnosis not present

## 2021-04-06 DIAGNOSIS — M25512 Pain in left shoulder: Secondary | ICD-10-CM

## 2021-04-06 MED ORDER — HYDROCHLOROTHIAZIDE 25 MG PO TABS: 25.0000 mg | ORAL_TABLET | Freq: Every day | ORAL | 1 refills | Status: DC

## 2021-04-06 MED ORDER — LEVOCETIRIZINE DIHYDROCHLORIDE 5 MG PO TABS
5.0000 mg | ORAL_TABLET | Freq: Every evening | ORAL | 2 refills | Status: DC
Start: 2021-04-06 — End: 2022-01-13

## 2021-04-06 MED ORDER — TRAMADOL HCL 50 MG PO TABS: 50.0000 mg | ORAL_TABLET | Freq: Two times a day (BID) | ORAL | 2 refills | Status: DC | PRN

## 2021-04-06 MED ORDER — NEXLIZET 180-10 MG PO TABS: 1.0000 | ORAL_TABLET | Freq: Every day | ORAL | 1 refills | Status: DC

## 2021-04-06 NOTE — Telephone Encounter (Signed)
lmtcb   Schedule patient 6 week HTN appointment when she returns call

## 2021-04-06 NOTE — Progress Notes (Signed)
Assessment & Plan:  1-2. Chronic left shoulder pain/Chronic pain of right knee Well controlled on current regimen.  - traMADol (ULTRAM) 50 MG tablet; Take 1 tablet (50 mg total) by mouth 2 (two) times daily as needed.  Dispense: 60 tablet; Refill: 2  3. Controlled substance agreement signed Controlled substance agreement in place.  PDMP reviewed with no concerning findings.  Urine drug screen as expected. - traMADol (ULTRAM) 50 MG tablet; Take 1 tablet (50 mg total) by mouth 2 (two) times daily as needed.  Dispense: 60 tablet; Refill: 2  4. Benign essential hypertension Uncontrolled.  Hydrochlorothiazide increased from 12.5 mg to 25 mg once daily. - hydrochlorothiazide (HYDRODIURIL) 25 MG tablet; Take 1 tablet (25 mg total) by mouth daily.  Dispense: 90 tablet; Refill: 1  5. Mixed hyperlipidemia Uncontrolled.  Rx'd Nexlizet today.  Patient is intolerant to statins.  6. GAD (generalized anxiety disorder) Patient is currently under a lot of stress with her granddaughter as the mother is taking them to court to try to get joint custody.  7. Positive colorectal cancer screening using Cologuard test Keep appointment with gastroenterology tomorrow.  8. Non-seasonal allergic rhinitis, unspecified trigger Well controlled on current regimen.  - levocetirizine (XYZAL) 5 MG tablet; Take 1 tablet (5 mg total) by mouth every evening.  Dispense: 90 tablet; Refill: 2  9. Tobacco use Declined low-dose lung cancer screening CT.   Return in about 3 months (around 07/07/2021) for annual physical.  Hendricks Limes, MSN, APRN, FNP-C [facility]  Subjective:    Patient ID: Sherry Salinas, female    DOB: 03-26-1956, 65 y.o.   MRN: [id number]  Patient Care Team: Loman Brooklyn, FNP as PCP - General (Family Medicine) Gala Romney Cristopher Estimable, MD as Consulting Physician (Gastroenterology)   Chief Complaint:  Chief Complaint  Patient presents with  . Hypertension  . Hyperlipidemia     Check up of chronic medical conditions     HPI: Sherry Salinas is a 65 y.o. female presenting on 04/06/2021 for Hypertension and Hyperlipidemia (Check up of chronic medical conditions )  Hyperlipidemia: Patient is taking Zetia and tolerating it well.  Her 10-year ASCVD risk score is: 18.1%. She declines a referral to the lipid clinic.  Hypertension: Patient states she can tell her blood pressure is running high at home as well. She is not checking it at home as she cannot find her cuff.   Pain assessment: Cause of pain-arthritis, right knee injury with surgery, left shoulder injury falling off horse Pain location-right knee, left shoulder, hands/joints Pain on scale of 1-10-6-7/10 at night when trying to get comfortable Frequency-daily What increases pain-lying down What makes painbetter-using it, Tramadol, and Celebrex. Celebrex works, but causes diarrhea when she takes it twice daily every day. Effects on ADL -unable to do things she use too Any change in general medical condition-No  Current opioids rx-Tramadol 50 mg BID PRN # meds rx-60 Effectiveness of current meds-effective Adverse reactions form pain meds-none Morphine equivalent-10 MME/day  Pill count performed-No Last drug screen - 01/07/2021 ( high risk q37m moderate risk q698mlow risk yearly ) Urine drug screen today- No [place]- Yes  If yes were their any concerning findings? - No  Overdose risk: 380  Opioid Risk  04/07/2020  Alcohol 0  Illegal Drugs 0  Rx Drugs 0  Alcohol 0  Illegal Drugs 0  Rx Drugs 0  Age between 16-45 years  1  History of Preadolescent Sexual Abuse 0  Psychological Disease 0  Depression 0  Opioid Risk Tool Scoring 1  Opioid Risk Interpretation Low Risk   Pain contract signed on: 01/07/2021   Positive Cologuard: Cologuard positive in March. Has an appointment with GI tomorrow.   New complaints: None  Social history:  Relevant past medical, surgical,  family and social history reviewed and updated as indicated. Interim medical history since our last visit reviewed.  Allergies and medications reviewed and updated.  DATA REVIEWED: CHART IN EPIC  ROS: Negative unless specifically indicated above in HPI.    Current Outpatient Medications:  .  busPIRone (BUSPAR) 7.5 MG tablet, Take 1 tablet (7.5 mg total) by mouth 2 (two) times daily., Disp: 180 tablet, Rfl: 1 .  celecoxib (CELEBREX) 100 MG capsule, Take 1 capsule (100 mg total) by mouth 2 (two) times daily., Disp: 180 capsule, Rfl: 1 .  dicyclomine (BENTYL) 20 MG tablet, Take 1 tablet (20 mg total) by mouth 3 (three) times daily., Disp: 270 tablet, Rfl: 1 .  EPINEPHrine 0.3 mg/0.3 mL IJ SOAJ injection, Inject 0.3 mLs (0.3 mg total) into the muscle as needed for anaphylaxis., Disp: 1 each, Rfl: 3 .  ezetimibe (ZETIA) 10 MG tablet, Take 1 tablet (10 mg total) by mouth daily., Disp: 90 tablet, Rfl: 1 .  hydrochlorothiazide (MICROZIDE) 12.5 MG capsule, Take 1 capsule (12.5 mg total) by mouth daily., Disp: 90 capsule, Rfl: 1 .  levocetirizine (XYZAL) 5 MG tablet, Take 1 tablet (5 mg total) by mouth every evening., Disp: 90 tablet, Rfl: 2 .  metoprolol succinate (TOPROL-XL) 50 MG 24 hr tablet, Take 1 tablet (50 mg total) by mouth daily. Take with or immediately following a meal., Disp: 90 tablet, Rfl: 1 .  ondansetron (ZOFRAN) 4 MG tablet, Take 1 tablet (4 mg total) by mouth every 8 (eight) hours as needed for nausea or vomiting., Disp: 30 tablet, Rfl: 1 .  pantoprazole (PROTONIX) 40 MG tablet, Take 1 tablet (40 mg total) by mouth daily. 30 minutes before breakfast daily, Disp: 90 tablet, Rfl: 1 .  traMADol (ULTRAM) 50 MG tablet, Take 1 tablet (50 mg total) by mouth 2 (two) times daily as needed., Disp: 60 tablet, Rfl: 2 .  triamcinolone (KENALOG) 0.1 %, Apply topically 2 (two) times daily as needed., Disp: 80 g, Rfl: 2   Allergies  Allergen Reactions  . Bee Venom Anaphylaxis  . Codeine  Anaphylaxis  . Other Other (See Comments)    Muscle aches  . Statins Other (See Comments)    Muscle aches  . Sulfa Antibiotics Rash  . Sulfasalazine Rash   Past Medical History:  Diagnosis Date  . Anxiety   . Arthritis   . Hyperlipidemia   . Hypertension   . IBS (irritable bowel syndrome)   . Osteopenia 01/08/2021  . Vitamin D deficiency 09/14/2017    Past Surgical History:  Procedure Laterality Date  . COLONOSCOPY  2015   Dr. Amedeo Plenty: per pt, diverticulosis, 3 polyps, come back in five years.   . COLONOSCOPY WITH ESOPHAGOGASTRODUODENOSCOPY (EGD)  02/2003   Dr. Amedeo Plenty: Grade 3 esophagitis, hiatal hernia with patulous GE junction, mild duodenitis.  CLOtest, results unavailable.  Colonoscopy normal.  . REPLACEMENT TOTAL KNEE Right 2012  . ROTATOR CUFF REPAIR Right     Social History   Socioeconomic History  . Marital status: Married    Spouse name: Not on file  . Number of children: 2  . Years of education: Not on file  . Highest education level: Associate degree: academic  program  Occupational History  . Not on file  Tobacco Use  . Smoking status: Current Every Day Smoker    Packs/day: 1.00    Years: 30.00    Pack years: 30.00    Types: Cigarettes  . Smokeless tobacco: Never Used  Vaping Use  . Vaping Use: Never used  Substance and Sexual Activity  . Alcohol use: Never  . Drug use: Never  . Sexual activity: Not on file  Other Topics Concern  . Not on file  Social History Narrative   Lives with husband, son and granddaughter    Social Determinants of Health   Financial Resource Strain: Not on file  Food Insecurity: Not on file  Transportation Needs: Not on file  Physical Activity: Not on file  Stress: Not on file  Social Connections: Not on file  Intimate Partner Violence: Not on file        Objective:    BP (!) 141/83   Pulse 66   Temp 98.2 F (36.8 C) (Temporal)   Ht _0  (1.6 m)   Wt 147 lb 6.4 oz (66.9 kg)   SpO2 98%   BMI 26.11 kg/m   Wt  Readings from Last 3 Encounters:  04/06/21 147 lb 6.4 oz (66.9 kg)  02/06/21 147 lb 3.2 oz (66.8 kg)  01/07/21 148 lb (67.1 kg)   Physical Exam Vitals reviewed.  Constitutional:      General: She is not in acute distress.    Appearance: Normal appearance. She is overweight. She is not ill-appearing, toxic-appearing or diaphoretic.  HENT:     Head: Normocephalic and atraumatic.  Eyes:     General: No scleral icterus.       Right eye: No discharge.        Left eye: No discharge.     Conjunctiva/sclera: Conjunctivae normal.  Cardiovascular:     Rate and Rhythm: Normal rate and regular rhythm.     Heart sounds: Normal heart sounds. No murmur heard. No friction rub. No gallop.   Pulmonary:     Effort: Pulmonary effort is normal. No respiratory distress.     Breath sounds: Normal breath sounds. No stridor. No wheezing, rhonchi or rales.  Musculoskeletal:        General: Normal range of motion.     Cervical back: Normal range of motion.  Skin:    General: Skin is warm and dry.     Capillary Refill: Capillary refill takes less than 2 seconds.  Neurological:     General: No focal deficit present.     Mental Status: She is alert and oriented to person, place, and time. Mental status is at baseline.  Psychiatric:        Mood and Affect: Mood normal.        Behavior: Behavior normal.        Thought Content: Thought content normal.        Judgment: Judgment normal.    Lab Results  Component Value Date   TSH 1.990 01/11/2020   Lab Results  Component Value Date   WBC 7.9 02/06/2021   HGB 14.1 02/06/2021   HCT 41.2 02/06/2021   MCV 95 02/06/2021   PLT 227 02/06/2021   Lab Results  Component Value Date   NA 140 02/06/2021   K 4.1 02/06/2021   CO2 21 02/06/2021   GLUCOSE 90 02/06/2021   BUN 17 02/06/2021   CREATININE 0.97 02/06/2021   BILITOT 0.5 02/06/2021   ALKPHOS 76 02/06/2021  AST 24 02/06/2021   ALT 25 02/06/2021   PROT 6.4 02/06/2021   ALBUMIN 4.3 02/06/2021    CALCIUM 9.7 02/06/2021   EGFR 65 02/06/2021   Lab Results  Component Value Date   CHOL 208 (H) 02/06/2021   Lab Results  Component Value Date   HDL 43 02/06/2021   Lab Results  Component Value Date   LDLCALC 140 (H) 02/06/2021   Lab Results  Component Value Date   TRIG 138 02/06/2021   Lab Results  Component Value Date   CHOLHDL 4.8 (H) 02/06/2021   No results found for: HGBA1C

## 2021-04-06 NOTE — Progress Notes (Signed)
Lmtcb   Schedule 6 week HTN

## 2021-04-06 NOTE — Patient Instructions (Signed)
Voltaren gel for joint pain.   High Cholesterol  High cholesterol is a condition in which the blood has high levels of a white, waxy substance similar to fat (cholesterol). The liver makes all the cholesterol that the body needs. The human body needs small amounts of cholesterol to help build cells. A person gets extra or excess cholesterol from the food that he or she eats. The blood carries cholesterol from the liver to the rest of the body. If you have high cholesterol, deposits (plaques) may build up on the walls of your arteries. Arteries are the blood vessels that carry blood away from your heart. These plaques make the arteries narrow and stiff. Cholesterol plaques increase your risk for heart attack and stroke. Work with your health care provider to keep your cholesterol levels in a healthy range. What increases the risk? The following factors may make you more likely to develop this condition:  Eating foods that are high in animal fat (saturated fat) or cholesterol.  Being overweight.  Not getting enough exercise.  A family history of high cholesterol (familial hypercholesterolemia).  Use of tobacco products.  Having diabetes. What are the signs or symptoms? There are no symptoms of this condition. How is this diagnosed? This condition may be diagnosed based on the results of a blood test.  If you are older than 66 years of age, your health care provider may check your cholesterol levels every 4-6 years.  You may be checked more often if you have high cholesterol or other risk factors for heart disease. The blood test for cholesterol measures:  "Bad" cholesterol, or LDL cholesterol. This is the main type of cholesterol that causes heart disease. The desired level is less than 100 mg/dL.  "Good" cholesterol, or HDL cholesterol. HDL helps protect against heart disease by cleaning the arteries and carrying the LDL to the liver for processing. The desired level for HDL is 60  mg/dL or higher.  Triglycerides. These are fats that your body can store or burn for energy. The desired level is less than 150 mg/dL.  Total cholesterol. This measures the total amount of cholesterol in your blood and includes LDL, HDL, and triglycerides. The desired level is less than 200 mg/dL. How is this treated? This condition may be treated with:  Diet changes. You may be asked to eat foods that have more fiber and less saturated fats or added sugar.  Lifestyle changes. These may include regular exercise, maintaining a healthy weight, and quitting use of tobacco products.  Medicines. These are given when diet and lifestyle changes have not worked. You may be prescribed a statin medicine to help lower your cholesterol levels. Follow these instructions at home: Eating and drinking  Eat a healthy, balanced diet. This diet includes: ? Daily servings of a variety of fresh, frozen, or canned fruits and vegetables. ? Daily servings of whole grain foods that are rich in fiber. ? Foods that are low in saturated fats and trans fats. These include poultry and fish without skin, lean cuts of meat, and low-fat dairy products. ? A variety of fish, especially oily fish that contain omega-3 fatty acids. Aim to eat fish at least 2 times a week.  Avoid foods and drinks that have added sugar.  Use healthy cooking methods, such as roasting, grilling, broiling, baking, poaching, steaming, and stir-frying. Do not fry your food except for stir-frying.   Lifestyle  Get regular exercise. Aim to exercise for a total of 150 minutes a week.  Increase your activity level by doing activities such as gardening, walking, and taking the stairs.  Do not use any products that contain nicotine or tobacco, such as cigarettes, e-cigarettes, and chewing tobacco. If you need help quitting, ask your health care provider.   General instructions  Take over-the-counter and prescription medicines only as told by your  health care provider.  Keep all follow-up visits as told by your health care provider. This is important. Where to find more information  American Heart Association: www.heart.org  National Heart, Lung, and Blood Institute: https://wilson-eaton.com/ Contact a health care provider if:  You have trouble achieving or maintaining a healthy diet or weight.  You are starting an exercise program.  You are unable to stop smoking. Get help right away if:  You have chest pain.  You have trouble breathing.  You have any symptoms of a stroke. "BE FAST" is an easy way to remember the main warning signs of a stroke: ? B - Balance. Signs are dizziness, sudden trouble walking, or loss of balance. ? E - Eyes. Signs are trouble seeing or a sudden change in vision. ? F - Face. Signs are sudden weakness or numbness of the face, or the face or eyelid drooping on one side. ? A - Arms. Signs are weakness or numbness in an arm. This happens suddenly and usually on one side of the body. ? S - Speech. Signs are sudden trouble speaking, slurred speech, or trouble understanding what people say. ? T - Time. Time to call emergency services. Write down what time symptoms started.  You have other signs of a stroke, such as: ? A sudden, severe headache with no known cause. ? Nausea or vomiting. ? Seizure. These symptoms may represent a serious problem that is an emergency. Do not wait to see if the symptoms will go away. Get medical help right away. Call your local emergency services (911 in the [place] Do not drive yourself to the hospital. Summary  Cholesterol plaques increase your risk for heart attack and stroke. Work with your health care provider to keep your cholesterol levels in a healthy range.  Eat a healthy, balanced diet, get regular exercise, and maintain a healthy weight.  Do not use any products that contain nicotine or tobacco, such as cigarettes, e-cigarettes, and chewing tobacco.  Get help right  away if you have any symptoms of a stroke. This information is not intended to replace advice given to you by your health care provider. Make sure you discuss any questions you have with your health care provider. Document Revised: 10/22/2019 Document Reviewed: 10/22/2019 Elsevier Patient Education  2021 [employer].

## 2021-04-06 NOTE — Telephone Encounter (Signed)
-----   Message from Loman Brooklyn, [place] sent at 04/06/2021 11:15 AM EDT ----- Can we ask her to scheudule a 6 week follow-up of hypertension? I wasn't thinking about the change in medication when I told her when to follow up.

## 2021-04-06 NOTE — Progress Notes (Signed)
Lmtcb  Schedule 6 week HTN when patient calls back

## 2021-04-07 ENCOUNTER — Encounter: Payer: Self-pay | Admitting: Gastroenterology

## 2021-04-07 ENCOUNTER — Encounter: Payer: Self-pay | Admitting: *Deleted

## 2021-04-07 ENCOUNTER — Ambulatory Visit: Payer: Medicare HMO | Admitting: Gastroenterology

## 2021-04-07 ENCOUNTER — Telehealth: Payer: Self-pay | Admitting: *Deleted

## 2021-04-07 VITALS — BP 146/81 | HR 62 | Temp 97.5°F | Ht 63.0 in | Wt 147.0 lb

## 2021-04-07 DIAGNOSIS — K760 Fatty (change of) liver, not elsewhere classified: Secondary | ICD-10-CM | POA: Diagnosis not present

## 2021-04-07 DIAGNOSIS — K219 Gastro-esophageal reflux disease without esophagitis: Secondary | ICD-10-CM

## 2021-04-07 DIAGNOSIS — R1032 Left lower quadrant pain: Secondary | ICD-10-CM

## 2021-04-07 DIAGNOSIS — R195 Other fecal abnormalities: Secondary | ICD-10-CM

## 2021-04-07 MED ORDER — PEG 3350-KCL-NA BICARB-NACL 420 G PO SOLR: ORAL | 0 refills | Status: DC

## 2021-04-07 NOTE — Telephone Encounter (Signed)
PA approved via [employer] for colonoscopy. Auth# [id number] dos 05/22/2021-06/21/2021

## 2021-04-07 NOTE — Patient Instructions (Addendum)
1. Colonoscopy to be scheduled. See separate instructions.  2. Consider liver ultrasound at some point after colonoscopy complete, to follow-up on liver lesions.

## 2021-04-07 NOTE — Progress Notes (Signed)
Primary Care Physician:  Loman Brooklyn, FNP  Primary Gastroenterologist:  Garfield Cornea, MD   Chief Complaint  Patient presents with  . positive cologuard    Last tcs 7-8 years ago-had polyps. Done by Dr. Amedeo Plenty in [place]  . Hemorrhoids    Occ bleeding  . wants to discuss fattly liver    HPI:  Sherry Salinas is a 65 y.o. female here for follow-up.  She was last seen in March 2021.  She has a history of left lower quadrant pain presumed to be due to diverticulitis, longstanding history of IBS, personal history of colon polyps in 2015 by Dr. Amedeo Plenty.  When initially seen in February 2021 she completed a CT scan due to concern for smoldering diverticulitis.  No acute findings.  See below for details.  She was provided with at least 3 rounds of antibiotic therapy but would have return of symptoms off of antibiotics.  She was scheduled for an EGD (also with epigastric pain at that time) and colonoscopy after last office visit but patient called and canceled due to cost of co-pays and medical bills.  CT A/P with contrast 01/2020: Hepatic steatosis, occasional low-attenuation lesions of the liver, the largest of which are simple cyst or hemangiomata, others too small to characterize although likely small cyst or hemangiomata.  We recommended 17-month follow-up right upper quadrant ultrasound but patient declined due to cost of co-pays and medical bills.  Today: LLQ every day December 2020.  Has chronically been on dicyclomine, more than 20 years.  Helps a lot with reducing bowel movement frequency.  Historically has IBS-D.  Does seem to help her abdominal pain as well.  Bowel movements can alternate between constipation and diarrhea times. Now more "smooth". Wipes blood intermittent. Thinks it is hemorrhoids. Does not see blood mixed in the stool. Appetite good. Pantoprazole 40mg  daily but sometimes at bedtime too. Seems to work ok for her heartburn. No dysphagia. No vomiting.  Takes Celebrex only if  absolutely needs it because it seems to upset her stomach.   Completed Cologuard March 2022: Positive  Current Outpatient Medications  Medication Sig Dispense Refill  . Bempedoic Acid-Ezetimibe (NEXLIZET) 180-10 MG TABS Take 1 tablet by mouth daily. 90 tablet 1  . busPIRone (BUSPAR) 7.5 MG tablet Take 1 tablet (7.5 mg total) by mouth 2 (two) times daily. 180 tablet 1  . celecoxib (CELEBREX) 100 MG capsule Take 1 capsule (100 mg total) by mouth 2 (two) times daily. (Patient taking differently: Take 100 mg by mouth as needed.) 180 capsule 1  . dicyclomine (BENTYL) 20 MG tablet Take 1 tablet (20 mg total) by mouth 3 (three) times daily. 270 tablet 1  . EPINEPHrine 0.3 mg/0.3 mL IJ SOAJ injection Inject 0.3 mLs (0.3 mg total) into the muscle as needed for anaphylaxis. 1 each 3  . hydrochlorothiazide (HYDRODIURIL) 25 MG tablet Take 1 tablet (25 mg total) by mouth daily. 90 tablet 1  . levocetirizine (XYZAL) 5 MG tablet Take 1 tablet (5 mg total) by mouth every evening. 90 tablet 2  . metoprolol succinate (TOPROL-XL) 50 MG 24 hr tablet Take 1 tablet (50 mg total) by mouth daily. Take with or immediately following a meal. 90 tablet 1  . ondansetron (ZOFRAN) 4 MG tablet Take 1 tablet (4 mg total) by mouth every 8 (eight) hours as needed for nausea or vomiting. 30 tablet 1  . pantoprazole (PROTONIX) 40 MG tablet Take 1 tablet (40 mg total) by mouth daily. 30 minutes before  breakfast daily 90 tablet 1  . traMADol (ULTRAM) 50 MG tablet Take 1 tablet (50 mg total) by mouth 2 (two) times daily as needed. 60 tablet 2  . triamcinolone (KENALOG) 0.1 % Apply topically 2 (two) times daily as needed. 80 g 2   No current facility-administered medications for this visit.    Allergies as of 04/07/2021 - Review Complete 04/07/2021  Allergen Reaction Noted  . Bee venom Anaphylaxis 07/31/2013  . Codeine Anaphylaxis 09/24/2011  . Other Other (See Comments) 07/19/2019  . Statins Other (See Comments) 07/19/2019  .  Sulfa antibiotics Rash 05/14/2013  . Sulfasalazine Rash 05/14/2013    Past Medical History:  Diagnosis Date  . Anxiety   . Arthritis   . Hyperlipidemia   . Hypertension   . IBS (irritable bowel syndrome)   . Osteopenia 01/08/2021  . Vitamin D deficiency 09/14/2017    Past Surgical History:  Procedure Laterality Date  . COLONOSCOPY  2015   Dr. Amedeo Plenty: per pt, diverticulosis, 3 polyps, come back in five years.   . COLONOSCOPY WITH ESOPHAGOGASTRODUODENOSCOPY (EGD)  02/2003   Dr. Amedeo Plenty: Grade 3 esophagitis, hiatal hernia with patulous GE junction, mild duodenitis.  CLOtest, results unavailable.  Colonoscopy normal.  . REPLACEMENT TOTAL KNEE Right 2012  . ROTATOR CUFF REPAIR Right     Family History  Problem Relation Age of Onset  . Arthritis Mother   . Kidney failure Mother   . Rheum arthritis Mother   . Stroke Father   . Heart disease Father   . Heart attack Father   . Stroke Maternal Grandfather   . Early death Paternal Grandmother        Childbirth  . Early death Paternal Grandfather        MVA  . Colon cancer Paternal Uncle     Social History   Socioeconomic History  . Marital status: Married    Spouse name: Not on file  . Number of children: 2  . Years of education: Not on file  . Highest education level: Associate degree: academic program  Occupational History  . Not on file  Tobacco Use  . Smoking status: Current Every Day Smoker    Packs/day: 1.00    Years: 30.00    Pack years: 30.00    Types: Cigarettes  . Smokeless tobacco: Never Used  Vaping Use  . Vaping Use: Never used  Substance and Sexual Activity  . Alcohol use: Never  . Drug use: Never  . Sexual activity: Not on file  Other Topics Concern  . Not on file  Social History Narrative   Lives with husband, son and granddaughter    Social Determinants of Health   Financial Resource Strain: Not on file  Food Insecurity: Not on file  Transportation Needs: Not on file  Physical Activity: Not  on file  Stress: Not on file  Social Connections: Not on file  Intimate Partner Violence: Not on file      ROS:  General: Negative for anorexia, weight loss, fever, chills, fatigue, weakness. Eyes: Negative for vision changes.  ENT: Negative for hoarseness, difficulty swallowing , nasal congestion. CV: Negative for chest pain, angina, palpitations, dyspnea on exertion, peripheral edema.  Respiratory: Negative for dyspnea at rest, dyspnea on exertion, cough, sputum, wheezing.  GI: See history of present illness. GU:  Negative for dysuria, hematuria, urinary incontinence, urinary frequency, nocturnal urination.  MS: Positive for joint pain, no low back pain.  Derm: Negative for rash or itching.  Neuro: Negative  for weakness, abnormal sensation, seizure, frequent headaches, memory loss, confusion.  Psych: Negative for anxiety, depression, suicidal ideation, hallucinations.  Endo: Negative for unusual weight change.  Heme: Negative for bruising or bleeding. Allergy: Negative for rash or hives.    Physical Examination:  BP (!) 146/81   Pulse 62   Temp (!) 97.5 F (36.4 C) (Temporal)   Ht 5\' 3"  (1.6 m)   Wt 147 lb (66.7 kg)   BMI 26.04 kg/m    General: Well-nourished, well-developed in no acute distress.  Head: Normocephalic, atraumatic.   Eyes: Conjunctiva pink, no icterus. Mouth: Oropharyngeal mucosa moist and pink, no lesions erythema or exudate. Neck: Supple without thyromegaly, masses, or lymphadenopathy.  Lungs: Clear to auscultation bilaterally.  Heart: Regular rate and rhythm, no murmurs rubs or gallops.  Abdomen: Bowel sounds are normal, nontender, nondistended, no hepatosplenomegaly or masses, no abdominal bruits or    hernia , no rebound or guarding.   Rectal: not performed Extremities: No lower extremity edema. No clubbing or deformities.  Neuro: Alert and oriented x 4 , grossly normal neurologically.  Skin: Warm and dry, no rash or jaundice.   Psych: Alert and  cooperative, normal mood and affect.  Labs: Lab Results  Component Value Date   WBC 7.9 02/06/2021   HGB 14.1 02/06/2021   HCT 41.2 02/06/2021   MCV 95 02/06/2021   PLT 227 02/06/2021   Lab Results  Component Value Date   CREATININE 0.97 02/06/2021   BUN 17 02/06/2021   NA 140 02/06/2021   K 4.1 02/06/2021   CL 101 02/06/2021   CO2 21 02/06/2021   Lab Results  Component Value Date   ALT 25 02/06/2021   AST 24 02/06/2021   ALKPHOS 76 02/06/2021   BILITOT 0.5 02/06/2021   Cologuard + 02/2021   Imaging Studies: No results found.  Assessment/plan:  65 year old female presenting to schedule colonoscopy for history of positive Cologuard testing.  She is well-known from previous evaluation for chronic left lower quadrant pain, presumed to be diverticulitis based on clinical presentation.  Multiple rounds of antibiotics in early 2021 with improvement of significant pain.  Has persisting milder daily pain in the left lower quadrant since December 2020.  CT last year without evidence of active diverticulitis.  Given chronicity of her left lower quadrant pain despite improvement in bowel function, lack of resolution with multiple rounds of antibiotics, CT findings, question persistent symptoms due to IBS/diverticulosis.  Given positive Cologuard, malignancy needs to be excluded.  CT imaging last year showed hepatic steatosis with several small liver lesions too small to characterize.  Recommended 97-month surveillance ultrasound for liver lesions but patient declined at that time due to medical expenses.  Consider surveillance ultrasound after upcoming colonoscopy if patient agreeable.    1. Colonoscopy in the near future with Dr. Abbey Chatters (due to scheduling and expediting patient care given +cologuard). ASA II.  I have discussed the risks, alternatives, benefits with regards to but not limited to the risk of reaction to medication, bleeding, infection, perforation and the patient is  agreeable to proceed. Written consent to be obtained. 2. Consider ultrasound liver after colonoscopy.  This is to follow-up on liver lesions. 3. Continue to monitor LFTs 1-2 times per year.  Currently they have been normal. 4. Consider 5 to 10 pound weight loss. 5. Walk 15 to 20 minutes daily. 6. Continue dicyclomine 10 mg 3 times daily to control stool frequency and abdominal pain. 7. Continue pantoprazole 40 mg daily.

## 2021-04-10 ENCOUNTER — Telehealth: Payer: Self-pay

## 2021-04-10 NOTE — Telephone Encounter (Signed)
Nexlizet 180-10mg  tablets requiring PA  Alternatives:  Ezetimibe - does not require PA Nexletol- PA required   Please advise if you would like change medication or would like PA completed.

## 2021-04-14 ENCOUNTER — Telehealth: Payer: Self-pay | Admitting: *Deleted

## 2021-04-14 DIAGNOSIS — E782 Mixed hyperlipidemia: Secondary | ICD-10-CM

## 2021-04-14 NOTE — Telephone Encounter (Signed)
Approvedtoday PA Case: [id number], Status: Approved, Coverage Starts on: 12/06/2020 12:00:00 AM, Coverage Ends on: 12/05/2021 12:00:00 AM. Questions? Contact [phone].

## 2021-04-14 NOTE — Telephone Encounter (Signed)
PA please. She is already taking Ezetimibe.

## 2021-04-14 NOTE — Telephone Encounter (Signed)
Nexlizet 180-10MG  tablets PA came in  Key: [place] to plan

## 2021-04-15 NOTE — Telephone Encounter (Signed)
Nexlizet 180-10mg  tablet has been approved until 12/05/2021

## 2021-04-23 ENCOUNTER — Telehealth: Payer: Self-pay | Admitting: Family Medicine

## 2021-04-23 NOTE — Telephone Encounter (Signed)
There is a Manufacturing systems engineer at [email or web address] that covers cholesterol medications for medicare patients  She can come see me for help with that or do it online at home-30 min pharmacy clinic appt  As long as RX is covered on insurance, this grant will work  It provides $2500 year to assist with copay  Thanks! Almyra Free

## 2021-04-23 NOTE — Telephone Encounter (Signed)
Attempted to contact - NA 

## 2021-04-23 NOTE — Telephone Encounter (Signed)
Sherry Salinas, patient is on Nexlizet and is having difficulty paying cost of prescription.  Are there any programs to help her?

## 2021-04-27 NOTE — Telephone Encounter (Signed)
Patient reports she cannot afford the cost of Nexlizet and wants to go back on Zetia.  I spoke with Almyra Free and she said there is a foundation that will help cover the cost of cholesterol medications for people on Medicare.  I relayed this information to patient, offered her an appointment with Almyra Free to discuss and fill out the application and patient declined.  She said she did not want to do all that and then she either not be able to take the medication or it not work. She would like you to send in refill of Zetia to [facility].

## 2021-04-28 NOTE — Telephone Encounter (Signed)
I spoke to patient.  She is agreeable and staying on the medication and coming in for an appointment with Almyra Free.

## 2021-04-30 ENCOUNTER — Ambulatory Visit: Payer: Medicare HMO | Admitting: Family Medicine

## 2021-05-12 ENCOUNTER — Telehealth: Payer: Self-pay | Admitting: Family Medicine

## 2021-05-12 NOTE — Telephone Encounter (Signed)
lmtcb

## 2021-05-14 NOTE — Telephone Encounter (Signed)
Rc for nurse 

## 2021-05-14 NOTE — Telephone Encounter (Signed)
Patient states that PCP wanted her to go on Nexlizet but it was too high.  She would like to go back on ezetimbe which she has some left over. Please advise

## 2021-05-15 ENCOUNTER — Ambulatory Visit: Payer: Medicare HMO | Admitting: Pharmacist

## 2021-05-18 ENCOUNTER — Other Ambulatory Visit: Payer: Self-pay | Admitting: *Deleted

## 2021-05-18 DIAGNOSIS — E782 Mixed hyperlipidemia: Secondary | ICD-10-CM

## 2021-05-19 ENCOUNTER — Telehealth: Payer: Self-pay | Admitting: Internal Medicine

## 2021-05-19 DIAGNOSIS — R195 Other fecal abnormalities: Secondary | ICD-10-CM

## 2021-05-19 MED ORDER — EZETIMIBE 10 MG PO TABS: 10.0000 mg | ORAL_TABLET | Freq: Every day | ORAL | 1 refills | Status: DC

## 2021-05-19 NOTE — Telephone Encounter (Signed)
I D/C'd Nexlizet and sent back in Zetia 10 mg once daily.

## 2021-05-19 NOTE — Telephone Encounter (Signed)
It's still expensive after the PA was approved?

## 2021-05-19 NOTE — Telephone Encounter (Signed)
Pt is having car trouble and needs to reschedule her procedure with Dr Abbey Chatters on 6/17. [phone]

## 2021-05-19 NOTE — Telephone Encounter (Signed)
Called pt. She has been rescheduled to 7/19 at 8:30am. Aware will mail new prep instructions. Lab letter also mailed with instructions.  PA approved via [employer] for [employer]. Auth# [id number], DOS 06/23/2021-07/23/2021

## 2021-05-19 NOTE — Telephone Encounter (Signed)
Patient states it was $120 after the PA

## 2021-05-20 ENCOUNTER — Other Ambulatory Visit (HOSPITAL_COMMUNITY): Payer: Medicare HMO

## 2021-05-20 ENCOUNTER — Ambulatory Visit: Payer: Medicare HMO | Admitting: Family Medicine

## 2021-05-20 NOTE — Telephone Encounter (Signed)
Patient aware and verbalized understanding. °

## 2021-05-27 DIAGNOSIS — Z0100 Encounter for examination of eyes and vision without abnormal findings: Secondary | ICD-10-CM | POA: Diagnosis not present

## 2021-05-27 DIAGNOSIS — H5200 Hypermetropia, unspecified eye: Secondary | ICD-10-CM | POA: Diagnosis not present

## 2021-05-27 DIAGNOSIS — I10 Essential (primary) hypertension: Secondary | ICD-10-CM | POA: Diagnosis not present

## 2021-05-27 DIAGNOSIS — E7800 Pure hypercholesterolemia, unspecified: Secondary | ICD-10-CM | POA: Diagnosis not present

## 2021-06-01 ENCOUNTER — Ambulatory Visit: Payer: Medicare HMO | Admitting: Family Medicine

## 2021-06-04 ENCOUNTER — Other Ambulatory Visit: Payer: Self-pay

## 2021-06-04 ENCOUNTER — Encounter: Payer: Self-pay | Admitting: Family Medicine

## 2021-06-04 ENCOUNTER — Ambulatory Visit (INDEPENDENT_AMBULATORY_CARE_PROVIDER_SITE_OTHER): Payer: Medicare HMO | Admitting: Family Medicine

## 2021-06-04 VITALS — BP 131/72 | HR 75 | Temp 97.2°F | Ht 63.0 in | Wt 147.8 lb

## 2021-06-04 DIAGNOSIS — I10 Essential (primary) hypertension: Secondary | ICD-10-CM | POA: Diagnosis not present

## 2021-06-04 DIAGNOSIS — F411 Generalized anxiety disorder: Secondary | ICD-10-CM

## 2021-06-07 MED ORDER — BUSPIRONE HCL 7.5 MG PO TABS
7.5000 mg | ORAL_TABLET | Freq: Three times a day (TID) | ORAL | 1 refills | Status: DC
Start: 2021-06-07 — End: 2021-11-02

## 2021-06-07 NOTE — Progress Notes (Signed)
Assessment & Plan:  1. Benign essential hypertension Well controlled on current regimen.   2. GAD (generalized anxiety disorder) BuSpar increased from BID to TID.  - busPIRone (BUSPAR) 7.5 MG tablet; Take 1 tablet (7.5 mg total) by mouth 3 (three) times daily.  Dispense: 270 tablet; Refill: 1   Return as scheduled.  Hendricks Limes, MSN, APRN, FNP-C [facility]  Subjective:    Patient ID: Sherry Salinas, female    DOB: May 10, 1956, 65 y.o.   MRN: [id number]  Patient Care Team: Loman Brooklyn, FNP as PCP - General (Family Medicine) Gala Romney Cristopher Estimable, MD as Consulting Physician (Gastroenterology)   Chief Complaint:  Chief Complaint  Patient presents with   Hypertension    6 week follow up    HPI: Sherry Salinas is a 65 y.o. female presenting on 06/04/2021 for Hypertension (6 week follow up)  Patient is following up on hypertension. Her HCTZ was increased from 12.5 to 25 mg once daily at our last visit.    New complaints: Patient feels her anxiety gets worse between 3-7 PM. She is currently taking BuSpar twice daily at 5:30 AM and 3 PM. She has failed therapy in the past with Cymbalta and Prozac.    Social history:  Relevant past medical, surgical, family and social history reviewed and updated as indicated. Interim medical history since our last visit reviewed.  Allergies and medications reviewed and updated.  DATA REVIEWED: CHART IN EPIC  ROS: Negative unless specifically indicated above in HPI.    Current Outpatient Medications:    busPIRone (BUSPAR) 7.5 MG tablet, Take 1 tablet (7.5 mg total) by mouth 2 (two) times daily., Disp: 180 tablet, Rfl: 1   Calcium-Magnesium-Zinc (CAL-MAG-ZINC PO), Take 1 tablet by mouth daily., Disp: , Rfl:    celecoxib (CELEBREX) 100 MG capsule, Take 1 capsule (100 mg total) by mouth 2 (two) times daily., Disp: 180 capsule, Rfl: 1   dicyclomine (BENTYL) 20 MG tablet, Take 1 tablet (20 mg total) by mouth 3 (three)  times daily., Disp: 270 tablet, Rfl: 1   EPINEPHrine 0.3 mg/0.3 mL IJ SOAJ injection, Inject 0.3 mLs (0.3 mg total) into the muscle as needed for anaphylaxis., Disp: 1 each, Rfl: 3   ezetimibe (ZETIA) 10 MG tablet, Take 1 tablet (10 mg total) by mouth daily., Disp: 90 tablet, Rfl: 1   hydrochlorothiazide (HYDRODIURIL) 25 MG tablet, Take 1 tablet (25 mg total) by mouth daily., Disp: 90 tablet, Rfl: 1   levocetirizine (XYZAL) 5 MG tablet, Take 1 tablet (5 mg total) by mouth every evening. (Patient taking differently: Take 5 mg by mouth daily as needed for allergies.), Disp: 90 tablet, Rfl: 2   metoprolol succinate (TOPROL-XL) 50 MG 24 hr tablet, Take 1 tablet (50 mg total) by mouth daily. Take with or immediately following a meal., Disp: 90 tablet, Rfl: 1   ondansetron (ZOFRAN) 4 MG tablet, Take 1 tablet (4 mg total) by mouth every 8 (eight) hours as needed for nausea or vomiting., Disp: 30 tablet, Rfl: 1   pantoprazole (PROTONIX) 40 MG tablet, Take 1 tablet (40 mg total) by mouth daily. 30 minutes before breakfast daily, Disp: 90 tablet, Rfl: 1   polyethylene glycol-electrolytes (NULYTELY) 420 g solution, As directed, Disp: 4000 mL, Rfl: 0   traMADol (ULTRAM) 50 MG tablet, Take 1 tablet (50 mg total) by mouth 2 (two) times daily as needed. (Patient taking differently: Take 50 mg by mouth 2 (two) times daily as needed for severe  pain.), Disp: 60 tablet, Rfl: 2   triamcinolone (KENALOG) 0.1 %, Apply topically 2 (two) times daily as needed. (Patient taking differently: Apply 1 application topically 2 (two) times daily as needed (ear irritation).), Disp: 80 g, Rfl: 2   Allergies  Allergen Reactions   Bee Venom Anaphylaxis   Codeine Anaphylaxis   Statins Other (See Comments)    Muscle aches   Sulfa Antibiotics Rash   Sulfasalazine Rash   Past Medical History:  Diagnosis Date   Anxiety    Arthritis    Hyperlipidemia    Hypertension    IBS (irritable bowel syndrome)    Osteopenia 01/08/2021    Vitamin D deficiency 09/14/2017    Past Surgical History:  Procedure Laterality Date   COLONOSCOPY  2015   Dr. Amedeo Plenty: per pt, diverticulosis, 3 polyps, come back in five years.    COLONOSCOPY WITH ESOPHAGOGASTRODUODENOSCOPY (EGD)  02/2003   Dr. Amedeo Plenty: Grade 3 esophagitis, hiatal hernia with patulous GE junction, mild duodenitis.  CLOtest, results unavailable.  Colonoscopy normal.   REPLACEMENT TOTAL KNEE Right 2012   ROTATOR CUFF REPAIR Right     Social History   Socioeconomic History   Marital status: Married    Spouse name: Not on file   Number of children: 2   Years of education: Not on file   Highest education level: Associate degree: academic program  Occupational History   Not on file  Tobacco Use   Smoking status: Every Day    Packs/day: 1.00    Years: 30.00    Pack years: 30.00    Types: Cigarettes   Smokeless tobacco: Never  Vaping Use   Vaping Use: Never used  Substance and Sexual Activity   Alcohol use: Never   Drug use: Never   Sexual activity: Not on file  Other Topics Concern   Not on file  Social History Narrative   Lives with husband, son and granddaughter    Social Determinants of Health   Financial Resource Strain: Not on file  Food Insecurity: Not on file  Transportation Needs: Not on file  Physical Activity: Not on file  Stress: Not on file  Social Connections: Not on file  Intimate Partner Violence: Not on file        Objective:    BP 131/72   Pulse 75   Temp (!) 97.2 F (36.2 C) (Temporal)   Ht $R'5\' 3"'ga$  (1.6 m)   Wt 147 lb 12.8 oz (67 kg)   SpO2 92%   BMI 26.18 kg/m   Wt Readings from Last 3 Encounters:  06/04/21 147 lb 12.8 oz (67 kg)  04/07/21 147 lb (66.7 kg)  04/06/21 147 lb 6.4 oz (66.9 kg)    Physical Exam Vitals reviewed.  Constitutional:      General: She is not in acute distress.    Appearance: Normal appearance. She is overweight. She is not ill-appearing, toxic-appearing or diaphoretic.  HENT:     Head:  Normocephalic and atraumatic.  Eyes:     General: No scleral icterus.       Right eye: No discharge.        Left eye: No discharge.     Conjunctiva/sclera: Conjunctivae normal.  Cardiovascular:     Rate and Rhythm: Normal rate and regular rhythm.     Heart sounds: Normal heart sounds. No murmur heard.   No friction rub. No gallop.  Pulmonary:     Effort: Pulmonary effort is normal. No respiratory distress.  Breath sounds: Normal breath sounds. No stridor. No wheezing, rhonchi or rales.  Musculoskeletal:        General: Normal range of motion.     Cervical back: Normal range of motion.  Skin:    General: Skin is warm and dry.     Capillary Refill: Capillary refill takes less than 2 seconds.  Neurological:     General: No focal deficit present.     Mental Status: She is alert and oriented to person, place, and time. Mental status is at baseline.  Psychiatric:        Mood and Affect: Mood normal.        Behavior: Behavior normal.        Thought Content: Thought content normal.        Judgment: Judgment normal.    Lab Results  Component Value Date   TSH 1.990 01/11/2020   Lab Results  Component Value Date   WBC 7.9 02/06/2021   HGB 14.1 02/06/2021   HCT 41.2 02/06/2021   MCV 95 02/06/2021   PLT 227 02/06/2021   Lab Results  Component Value Date   NA 140 02/06/2021   K 4.1 02/06/2021   CO2 21 02/06/2021   GLUCOSE 90 02/06/2021   BUN 17 02/06/2021   CREATININE 0.97 02/06/2021   BILITOT 0.5 02/06/2021   ALKPHOS 76 02/06/2021   AST 24 02/06/2021   ALT 25 02/06/2021   PROT 6.4 02/06/2021   ALBUMIN 4.3 02/06/2021   CALCIUM 9.7 02/06/2021   EGFR 65 02/06/2021   Lab Results  Component Value Date   CHOL 208 (H) 02/06/2021   Lab Results  Component Value Date   HDL 43 02/06/2021   Lab Results  Component Value Date   LDLCALC 140 (H) 02/06/2021   Lab Results  Component Value Date   TRIG 138 02/06/2021   Lab Results  Component Value Date   CHOLHDL 4.8 (H)  02/06/2021   No results found for: HGBA1C

## 2021-06-19 ENCOUNTER — Other Ambulatory Visit (HOSPITAL_COMMUNITY)
Admission: RE | Admit: 2021-06-19 | Discharge: 2021-06-19 | Disposition: A | Discharge status: Home / Self Care | Payer: Medicare HMO | Source: Ambulatory Visit | Attending: Internal Medicine | Admitting: Internal Medicine

## 2021-06-19 DIAGNOSIS — R195 Other fecal abnormalities: Secondary | ICD-10-CM | POA: Insufficient documentation

## 2021-06-19 LAB — BASIC METABOLIC PANEL
Anion gap: 7 (ref 5–15)
BUN: 13 mg/dL (ref 8–23)
CO2: 27 mmol/L (ref 22–32)
Calcium: 9.4 mg/dL (ref 8.9–10.3)
Chloride: 102 mmol/L (ref 98–111)
Creatinine, Ser: 0.95 mg/dL (ref 0.44–1.00)
GFR, Estimated: >60 mL/min (ref >60)
Glucose, Bld: 105 mg/dL — ABNORMAL HIGH (ref 70–99)
Potassium: 4 mmol/L (ref 3.5–5.1)
Sodium: 136 mmol/L (ref 135–145)

## 2021-06-23 ENCOUNTER — Ambulatory Visit (HOSPITAL_COMMUNITY)
Admission: RE | Admit: 2021-06-23 | Discharge: 2021-06-23 | Disposition: A | Discharge status: Home / Self Care | Payer: Medicare HMO | Attending: Internal Medicine | Admitting: Internal Medicine

## 2021-06-23 ENCOUNTER — Ambulatory Visit (HOSPITAL_COMMUNITY): Payer: Medicare HMO | Admitting: Anesthesiology

## 2021-06-23 ENCOUNTER — Other Ambulatory Visit: Payer: Self-pay

## 2021-06-23 ENCOUNTER — Encounter (HOSPITAL_COMMUNITY)
Admission: RE | Disposition: A | Discharge status: Home / Self Care | Payer: Self-pay | Source: Home / Self Care | Attending: Internal Medicine

## 2021-06-23 ENCOUNTER — Encounter (HOSPITAL_COMMUNITY): Payer: Self-pay

## 2021-06-23 DIAGNOSIS — Z882 Allergy status to sulfonamides status: Secondary | ICD-10-CM | POA: Diagnosis not present

## 2021-06-23 DIAGNOSIS — F17210 Nicotine dependence, cigarettes, uncomplicated: Secondary | ICD-10-CM | POA: Insufficient documentation

## 2021-06-23 DIAGNOSIS — Z800 Family history of malignant neoplasm of digestive organs: Secondary | ICD-10-CM | POA: Insufficient documentation

## 2021-06-23 DIAGNOSIS — Z79899 Other long term (current) drug therapy: Secondary | ICD-10-CM | POA: Diagnosis not present

## 2021-06-23 DIAGNOSIS — Z885 Allergy status to narcotic agent status: Secondary | ICD-10-CM | POA: Diagnosis not present

## 2021-06-23 DIAGNOSIS — R195 Other fecal abnormalities: Secondary | ICD-10-CM | POA: Diagnosis not present

## 2021-06-23 DIAGNOSIS — Z8261 Family history of arthritis: Secondary | ICD-10-CM | POA: Insufficient documentation

## 2021-06-23 DIAGNOSIS — D123 Benign neoplasm of transverse colon: Secondary | ICD-10-CM | POA: Diagnosis not present

## 2021-06-23 DIAGNOSIS — Z91030 Bee allergy status: Secondary | ICD-10-CM | POA: Diagnosis not present

## 2021-06-23 DIAGNOSIS — Z888 Allergy status to other drugs, medicaments and biological substances status: Secondary | ICD-10-CM | POA: Insufficient documentation

## 2021-06-23 DIAGNOSIS — Z8249 Family history of ischemic heart disease and other diseases of the circulatory system: Secondary | ICD-10-CM | POA: Diagnosis not present

## 2021-06-23 DIAGNOSIS — Z823 Family history of stroke: Secondary | ICD-10-CM | POA: Diagnosis not present

## 2021-06-23 DIAGNOSIS — K5730 Diverticulosis of large intestine without perforation or abscess without bleeding: Secondary | ICD-10-CM | POA: Insufficient documentation

## 2021-06-23 DIAGNOSIS — K648 Other hemorrhoids: Secondary | ICD-10-CM | POA: Diagnosis not present

## 2021-06-23 DIAGNOSIS — K635 Polyp of colon: Secondary | ICD-10-CM | POA: Diagnosis not present

## 2021-06-23 DIAGNOSIS — D125 Benign neoplasm of sigmoid colon: Secondary | ICD-10-CM | POA: Insufficient documentation

## 2021-06-23 HISTORY — PX: COLONOSCOPY WITH PROPOFOL: SHX5780

## 2021-06-23 HISTORY — PX: POLYPECTOMY: SHX149

## 2021-06-23 SURGERY — COLONOSCOPY WITH PROPOFOL
Anesthesia: General

## 2021-06-23 MED ORDER — LACTATED RINGERS IV SOLN: INTRAVENOUS | Status: DC

## 2021-06-23 MED ORDER — STERILE WATER FOR IRRIGATION IR SOLN
Status: DC | PRN
  Administered 2021-06-23: 200 mL

## 2021-06-23 MED ORDER — PROPOFOL 500 MG/50ML IV EMUL
INTRAVENOUS | Status: DC | PRN
  Administered 2021-06-23: 125 ug/kg/min via INTRAVENOUS

## 2021-06-23 MED ORDER — PROPOFOL 10 MG/ML IV BOLUS
INTRAVENOUS | Status: DC | PRN
Start: 2021-06-23 — End: 2021-06-23
  Administered 2021-06-23: 30 mg via INTRAVENOUS
  Administered 2021-06-23: 100 mg via INTRAVENOUS

## 2021-06-23 NOTE — Transfer of Care (Signed)
Immediate Anesthesia Transfer of Care Note  Patient: NALIA HONEYCUTT  Procedure(s) Performed: COLONOSCOPY WITH PROPOFOL POLYPECTOMY INTESTINAL  Patient Location: Endoscopy Unit  Anesthesia Type:General  Level of Consciousness: awake, alert  and oriented  Airway & Oxygen Therapy: Patient Spontanous Breathing  Post-op Assessment: Report given to RN and Post -op Vital signs reviewed and stable  Post vital signs: Reviewed and stable  Last Vitals:  Vitals Value Taken Time  BP    Temp    Pulse    Resp    SpO2      Last Pain:  Vitals:   06/23/21 0807  TempSrc:   PainSc: 0-No pain      Patients Stated Pain Goal: 5 (41/28/20 8138)  Complications: No notable events documented.

## 2021-06-23 NOTE — Anesthesia Preprocedure Evaluation (Addendum)
Anesthesia Evaluation  Patient identified by MRN, date of birth, ID band Patient awake    Reviewed: Allergy & Precautions, NPO status , Patient's Chart, lab work & pertinent test results, reviewed documented beta blocker date and time   Airway Mallampati: I  TM Distance: >3 FB Neck ROM: Full    Dental  (+) Dental Advisory Given, Partial Lower, Partial Upper   Pulmonary Current SmokerPatient did not abstain from smoking.,    Pulmonary exam normal breath sounds clear to auscultation       Cardiovascular Exercise Tolerance: Good hypertension, Pt. on medications and Pt. on home beta blockers Normal cardiovascular exam Rhythm:Regular Rate:Normal     Neuro/Psych PSYCHIATRIC DISORDERS Anxiety negative neurological ROS     GI/Hepatic Neg liver ROS, GERD  Medicated and Controlled,  Endo/Other  negative endocrine ROS  Renal/GU negative Renal ROS     Musculoskeletal  (+) Arthritis ,   Abdominal   Peds  Hematology negative hematology ROS (+)   Anesthesia Other Findings   Reproductive/Obstetrics                            Anesthesia Physical Anesthesia Plan  ASA: 2  Anesthesia Plan: General   Post-op Pain Management:    Induction: Intravenous  PONV Risk Score and Plan: Propofol infusion  Airway Management Planned: Nasal Cannula and Natural Airway  Additional Equipment:   Intra-op Plan:   Post-operative Plan:   Informed Consent: I have reviewed the patients History and Physical, chart, labs and discussed the procedure including the risks, benefits and alternatives for the proposed anesthesia with the patient or authorized representative who has indicated his/her understanding and acceptance.     Dental advisory given  Plan Discussed with: CRNA and Surgeon  Anesthesia Plan Comments:         Anesthesia Quick Evaluation

## 2021-06-23 NOTE — H&P (Signed)
Primary Care Physician:  Loman Brooklyn, FNP Primary Gastroenterologist:  Dr. Abbey Chatters  Pre-Procedure History & Physical: HPI:  Sherry Salinas is a 65 y.o. female is here for a colonoscopy to be performed for positive cologuard testing.   Past Medical History:  Diagnosis Date   Anxiety    Arthritis    Hyperlipidemia    Hypertension    IBS (irritable bowel syndrome)    Osteopenia 01/08/2021   Vitamin D deficiency 09/14/2017    Past Surgical History:  Procedure Laterality Date   COLONOSCOPY  2015   Dr. Amedeo Plenty: per pt, diverticulosis, 3 polyps, come back in five years.    COLONOSCOPY WITH ESOPHAGOGASTRODUODENOSCOPY (EGD)  02/2003   Dr. Amedeo Plenty: Grade 3 esophagitis, hiatal hernia with patulous GE junction, mild duodenitis.  CLOtest, results unavailable.  Colonoscopy normal.   REPLACEMENT TOTAL KNEE Right 2012   ROTATOR CUFF REPAIR Right     Prior to Admission medications   Medication Sig Start Date End Date Taking? Authorizing Provider  busPIRone (BUSPAR) 7.5 MG tablet Take 1 tablet (7.5 mg total) by mouth 3 (three) times daily. 06/07/21  Yes Hendricks Limes F, FNP  Calcium-Magnesium-Zinc (CAL-MAG-ZINC PO) Take 1 tablet by mouth daily.   Yes [provider]  dicyclomine (BENTYL) 20 MG tablet Take 1 tablet (20 mg total) by mouth 3 (three) times daily. 01/07/21  Yes Hendricks Limes F, FNP  hydrochlorothiazide (HYDRODIURIL) 25 MG tablet Take 1 tablet (25 mg total) by mouth daily. 04/06/21  Yes Hendricks Limes F, FNP  levocetirizine (XYZAL) 5 MG tablet Take 1 tablet (5 mg total) by mouth every evening. Patient taking differently: Take 5 mg by mouth daily as needed for allergies. 04/06/21  Yes Hendricks Limes F, FNP  metoprolol succinate (TOPROL-XL) 50 MG 24 hr tablet Take 1 tablet (50 mg total) by mouth daily. Take with or immediately following a meal. 01/07/21  Yes Loman Brooklyn, FNP  ondansetron (ZOFRAN) 4 MG tablet Take 1 tablet (4 mg total) by mouth every 8 (eight) hours as needed for  nausea or vomiting. 10/06/20  Yes Ivy Lynn, NP  pantoprazole (PROTONIX) 40 MG tablet Take 1 tablet (40 mg total) by mouth daily. 30 minutes before breakfast daily 01/07/21  Yes Hendricks Limes F, FNP  polyethylene glycol-electrolytes (NULYTELY) 420 g solution As directed 04/07/21  Yes Kenyata Napier, Elon Alas, DO  traMADol (ULTRAM) 50 MG tablet Take 1 tablet (50 mg total) by mouth 2 (two) times daily as needed. Patient taking differently: Take 50 mg by mouth 2 (two) times daily as needed for severe pain. 04/06/21 03/02/25 Yes Hendricks Limes F, FNP  triamcinolone (KENALOG) 0.1 % Apply topically 2 (two) times daily as needed. Patient taking differently: Apply 1 application topically 2 (two) times daily as needed (ear irritation). 02/06/21  Yes Hendricks Limes F, FNP  celecoxib (CELEBREX) 100 MG capsule Take 1 capsule (100 mg total) by mouth 2 (two) times daily. 02/06/21   Loman Brooklyn, FNP  EPINEPHrine 0.3 mg/0.3 mL IJ SOAJ injection Inject 0.3 mLs (0.3 mg total) into the muscle as needed for anaphylaxis. 07/03/20   Loman Brooklyn, FNP  ezetimibe (ZETIA) 10 MG tablet Take 1 tablet (10 mg total) by mouth daily. 05/19/21   Loman Brooklyn, FNP    Allergies as of 04/07/2021 - Review Complete 04/07/2021  Allergen Reaction Noted   Bee venom Anaphylaxis 07/31/2013   Codeine Anaphylaxis 09/24/2011   Other Other (See Comments) 07/19/2019   Statins Other (See Comments) 07/19/2019  Sulfa antibiotics Rash 05/14/2013   Sulfasalazine Rash 05/14/2013    Family History  Problem Relation Age of Onset   Arthritis Mother    Kidney failure Mother    Rheum arthritis Mother    Stroke Father    Heart disease Father    Heart attack Father    Stroke Maternal Grandfather    Early death Paternal Grandmother        Childbirth   Early death Paternal Grandfather        MVA   Colon cancer Paternal Uncle     Social History   Socioeconomic History   Marital status: Married    Spouse name: Not on file   Number of  children: 2   Years of education: Not on file   Highest education level: Associate degree: academic program  Occupational History   Not on file  Tobacco Use   Smoking status: Every Day    Packs/day: 1.00    Years: 30.00    Pack years: 30.00    Types: Cigarettes   Smokeless tobacco: Never  Vaping Use   Vaping Use: Never used  Substance and Sexual Activity   Alcohol use: Never   Drug use: Never   Sexual activity: Not on file  Other Topics Concern   Not on file  Social History Narrative   Lives with husband, son and granddaughter    Social Determinants of Health   Financial Resource Strain: Not on file  Food Insecurity: Not on file  Transportation Needs: Not on file  Physical Activity: Not on file  Stress: Not on file  Social Connections: Not on file  Intimate Partner Violence: Not on file    Review of Systems: See HPI, otherwise negative ROS  Physical Exam: Vital signs in last 24 hours: Temp:  [98.4 F (36.9 C)] 98.4 F (36.9 C) (07/19 0720) Pulse Rate:  [68] 68 (07/19 0720) Resp:  [18] 18 (07/19 0720) BP: (145)/(83) 145/83 (07/19 0720) SpO2:  [96 %] 96 % (07/19 0720) Weight:  [64.4 kg] 64.4 kg (07/19 0720)   General:   Alert,  Well-developed, well-nourished, pleasant and cooperative in NAD Head:  Normocephalic and atraumatic. Eyes:  Sclera clear, no icterus.   Conjunctiva pink. Ears:  Normal auditory acuity. Nose:  No deformity, discharge,  or lesions. Mouth:  No deformity or lesions, dentition normal. Neck:  Supple; no masses or thyromegaly. Lungs:  Clear throughout to auscultation.   No wheezes, crackles, or rhonchi. No acute distress. Heart:  Regular rate and rhythm; no murmurs, clicks, rubs,  or gallops. Abdomen:  Soft, nontender and nondistended. No masses, hepatosplenomegaly or hernias noted. Normal bowel sounds, without guarding, and without rebound.   Msk:  Symmetrical without gross deformities. Normal posture. Extremities:  Without clubbing or  edema. Neurologic:  Alert and  oriented x4;  grossly normal neurologically. Skin:  Intact without significant lesions or rashes. Cervical Nodes:  No significant cervical adenopathy. Psych:  Alert and cooperative. Normal mood and affect.  Impression/Plan: Sherry Salinas is here for a colonoscopy to be performed for positive cologuard testing.   The risks of the procedure including infection, bleed, or perforation as well as benefits, limitations, alternatives and imponderables have been reviewed with the patient. Questions have been answered. All parties agreeable.

## 2021-06-23 NOTE — Op Note (Signed)
[facility] Patient Name: Sherry Salinas Procedure Date: 06/23/2021 8:02 AM MRN: [id number] Date of Birth: 12/30/1955 Attending MD: Elon Alas. Abbey Chatters DO CSN: [id number] Age: 64 Admit Type: Outpatient Procedure:                Colonoscopy Indications:              Positive Cologuard test Providers:                Elon Alas. Abbey Chatters, DO, Crystal Page, Nelma Rothman,                            Technician Referring MD:              Medicines:                See the Anesthesia note for documentation of the                            administered medications Complications:            No immediate complications. Estimated Blood Loss:     Estimated blood loss was minimal. Procedure:                Pre-Anesthesia Assessment:                           - The anesthesia plan was to use monitored                            anesthesia care (MAC).                           After obtaining informed consent, the colonoscope                            was passed under direct vision. Throughout the                            procedure, the patient's blood pressure, pulse, and                            oxygen saturations were monitored continuously. The                            [id number]-[id number] ([id number]) scope was introduced through                            the anus and advanced to the the cecum, identified                            by appendiceal orifice and ileocecal valve. The                            colonoscopy was performed without difficulty. The                            patient tolerated the procedure well. The quality  of the bowel preparation was evaluated using the                            BBPS [facility] Bowel Preparation Scale) with scores                            of: Right Colon = 3, Transverse Colon = 3 and Left                            Colon = 3 (entire mucosa seen well with no residual                            staining, small fragments of stool or  opaque                            liquid). The total BBPS score equals 9. Scope In: 8:11:34 AM Scope Out: 8:24:28 AM Scope Withdrawal Time: 0 hours 9 minutes 47 seconds  Total Procedure Duration: 0 hours 12 minutes 54 seconds  Findings:      The perianal and digital rectal examinations were normal.      Non-bleeding internal hemorrhoids were found during endoscopy.      Multiple small and large-mouthed diverticula were found in the sigmoid       colon and descending colon.      A 4 mm polyp was found in the transverse colon. The polyp was sessile.       The polyp was removed with a cold snare. Resection and retrieval were       complete.      A 3 mm polyp was found in the sigmoid colon. The polyp was sessile. The       polyp was removed with a cold snare. Resection and retrieval were       complete.      The exam was otherwise without abnormality. Impression:               - Non-bleeding internal hemorrhoids.                           - Diverticulosis in the sigmoid colon and in the                            descending colon.                           - One 4 mm polyp in the transverse colon, removed                            with a cold snare. Resected and retrieved.                           - One 3 mm polyp in the sigmoid colon, removed with                            a cold snare. Resected and retrieved.                           -  The examination was otherwise normal. Moderate Sedation:      Per Anesthesia Care Recommendation:           - Patient has a contact number available for                            emergencies. The signs and symptoms of potential                            delayed complications were discussed with the                            patient. Return to normal activities tomorrow.                            Written discharge instructions were provided to the                            patient.                           - Resume previous diet.                            - Continue present medications.                           - Await pathology results.                           - Repeat colonoscopy in 5 years for surveillance.                           - Return to GI clinic in 1 week. Procedure Code(s):        --- Professional ---                           [phone], Colonoscopy, flexible; with removal of                            tumor(s), polyp(s), or other lesion(s) by snare                            technique Diagnosis Code(s):        --- Professional ---                           K64.8, Other hemorrhoids                           K63.5, Polyp of colon                           R19.5, Other fecal abnormalities                           K57.30, Diverticulosis of large intestine without  perforation or abscess without bleeding CPT copyright 2019 [employer]. All rights reserved. The codes documented in this report are preliminary and upon coder review may  be revised to meet current compliance requirements. Elon Alas. Abbey Chatters, DO [place] Abbey Chatters, DO 06/23/2021 8:26:42 AM This report has been signed electronically. Number of Addenda: 0

## 2021-06-23 NOTE — Anesthesia Postprocedure Evaluation (Signed)
Anesthesia Post Note  Patient: Sherry Salinas  Procedure(s) Performed: COLONOSCOPY WITH PROPOFOL POLYPECTOMY INTESTINAL  Patient location during evaluation: Endoscopy Anesthesia Type: General Level of consciousness: awake and alert and oriented Pain management: pain level controlled Vital Signs Assessment: post-procedure vital signs reviewed and stable Respiratory status: spontaneous breathing and respiratory function stable Cardiovascular status: blood pressure returned to baseline and stable Postop Assessment: no apparent nausea or vomiting Anesthetic complications: no   No notable events documented.   Last Vitals:  Vitals:   06/23/21 0720 06/23/21 0827  BP: (!) 145/83 113/80  Pulse: 68 69  Resp: 18 16  Temp: 36.9 C 36.9 C  SpO2: 96% 99%    Last Pain:  Vitals:   06/23/21 0827  TempSrc: Oral  PainSc: 0-No pain                 Sophy Mesler C Gennavieve Huq

## 2021-06-23 NOTE — Discharge Instructions (Addendum)
  Colonoscopy Discharge Instructions  Read the instructions outlined below and refer to this sheet in the next few weeks. These discharge instructions provide you with general information on caring for yourself after you leave the hospital. Your doctor may also give you specific instructions. While your treatment has been planned according to the most current medical practices available, unavoidable complications occasionally occur.   ACTIVITY You may resume your regular activity, but move at a slower pace for the next 24 hours.  Take frequent rest periods for the next 24 hours.  Walking will help get rid of the air and reduce the bloated feeling in your belly (abdomen).  No driving for 24 hours (because of the medicine (anesthesia) used during the test).   Do not sign any important legal documents or operate any machinery for 24 hours (because of the anesthesia used during the test).  NUTRITION Drink plenty of fluids.  You may resume your normal diet as instructed by your doctor.  Begin with a light meal and progress to your normal diet. Heavy or fried foods are harder to digest and may make you feel sick to your stomach (nauseated).  Avoid alcoholic beverages for 24 hours or as instructed.  MEDICATIONS You may resume your normal medications unless your doctor tells you otherwise.  WHAT YOU CAN EXPECT TODAY Some feelings of bloating in the abdomen.  Passage of more gas than usual.  Spotting of blood in your stool or on the toilet paper.  IF YOU HAD POLYPS REMOVED DURING THE COLONOSCOPY: No aspirin products for 7 days or as instructed.  No alcohol for 7 days or as instructed.  Eat a soft diet for the next 24 hours.  FINDING OUT THE RESULTS OF YOUR TEST Not all test results are available during your visit. If your test results are not back during the visit, make an appointment with your caregiver to find out the results. Do not assume everything is normal if you have not heard from your  caregiver or the medical facility. It is important for you to follow up on all of your test results.  SEEK IMMEDIATE MEDICAL ATTENTION IF: You have more than a spotting of blood in your stool.  Your belly is swollen (abdominal distention).  You are nauseated or vomiting.  You have a temperature over 101.  You have abdominal pain or discomfort that is severe or gets worse throughout the day.   Your colonoscopy revealed 2 polyp(s) which I removed successfully. Await pathology results, my office will contact you. I recommend repeating colonoscopy in 5 years for surveillance purposes. You also have diverticulosis and internal hemorrhoids. I would recommend increasing fiber in your diet or adding OTC Benefiber/Metamucil. Be sure to drink at least 4 to 6 glasses of water daily. Follow-up with GI in 3 months.     I hope you have a great rest of your week!  Elon Alas. Abbey Chatters, D.O. Gastroenterology and Hepatology [facility]

## 2021-06-24 LAB — SURGICAL PATHOLOGY

## 2021-06-29 ENCOUNTER — Encounter (HOSPITAL_COMMUNITY): Payer: Self-pay | Admitting: Internal Medicine

## 2021-07-07 ENCOUNTER — Encounter: Payer: Medicare HMO | Admitting: Family Medicine

## 2021-07-07 ENCOUNTER — Encounter: Payer: Self-pay | Admitting: Family Medicine

## 2021-07-07 ENCOUNTER — Other Ambulatory Visit: Payer: Self-pay | Admitting: Family Medicine

## 2021-07-07 DIAGNOSIS — I10 Essential (primary) hypertension: Secondary | ICD-10-CM

## 2021-07-07 DIAGNOSIS — K219 Gastro-esophageal reflux disease without esophagitis: Secondary | ICD-10-CM

## 2021-07-07 DIAGNOSIS — K589 Irritable bowel syndrome without diarrhea: Secondary | ICD-10-CM

## 2021-07-08 ENCOUNTER — Other Ambulatory Visit: Payer: Self-pay | Admitting: *Deleted

## 2021-07-08 DIAGNOSIS — E785 Hyperlipidemia, unspecified: Secondary | ICD-10-CM

## 2021-07-08 MED ORDER — ONDANSETRON HCL 4 MG PO TABS: 4.0000 mg | ORAL_TABLET | Freq: Three times a day (TID) | ORAL | 1 refills | Status: DC | PRN

## 2021-07-15 ENCOUNTER — Encounter: Payer: Self-pay | Admitting: Family Medicine

## 2021-07-15 ENCOUNTER — Other Ambulatory Visit: Payer: Self-pay

## 2021-07-15 ENCOUNTER — Ambulatory Visit (INDEPENDENT_AMBULATORY_CARE_PROVIDER_SITE_OTHER): Payer: Medicare HMO | Admitting: Family Medicine

## 2021-07-15 VITALS — BP 151/85 | HR 64 | Temp 97.6°F | Ht 63.0 in | Wt 147.0 lb

## 2021-07-15 DIAGNOSIS — M25512 Pain in left shoulder: Secondary | ICD-10-CM

## 2021-07-15 DIAGNOSIS — M25551 Pain in right hip: Secondary | ICD-10-CM

## 2021-07-15 DIAGNOSIS — I10 Essential (primary) hypertension: Secondary | ICD-10-CM | POA: Diagnosis not present

## 2021-07-15 DIAGNOSIS — E782 Mixed hyperlipidemia: Secondary | ICD-10-CM | POA: Diagnosis not present

## 2021-07-15 DIAGNOSIS — Z23 Encounter for immunization: Secondary | ICD-10-CM | POA: Diagnosis not present

## 2021-07-15 DIAGNOSIS — G8929 Other chronic pain: Secondary | ICD-10-CM

## 2021-07-15 DIAGNOSIS — F411 Generalized anxiety disorder: Secondary | ICD-10-CM

## 2021-07-15 DIAGNOSIS — Z79899 Other long term (current) drug therapy: Secondary | ICD-10-CM

## 2021-07-15 DIAGNOSIS — M25561 Pain in right knee: Secondary | ICD-10-CM

## 2021-07-15 MED ORDER — TRAMADOL HCL 50 MG PO TABS: 50.0000 mg | ORAL_TABLET | Freq: Two times a day (BID) | ORAL | 2 refills | Status: DC | PRN

## 2021-07-15 MED ORDER — AMLODIPINE BESYLATE 5 MG PO TABS: 5.0000 mg | ORAL_TABLET | Freq: Every day | ORAL | 0 refills | Status: DC

## 2021-07-15 MED ORDER — METHOCARBAMOL 500 MG PO TABS: 500.0000 mg | ORAL_TABLET | Freq: Three times a day (TID) | ORAL | 2 refills | Status: DC | PRN

## 2021-07-15 NOTE — Patient Instructions (Addendum)
Reminder: please return after mid-September for your flu shot. If you receive this elsewhere, such as your pharmacy, please let us know so we can get this documented in your chart.   Monitor your BP at home, keep a log, and bring it with you to your next appointment.

## 2021-07-15 NOTE — Progress Notes (Signed)
Assessment & Plan:  1-2. Chronic left shoulder pain/ chronic pain of right knee - Well controlled on current regimen - traMADol (ULTRAM) 50 MG tablet; Take 1 tablet (50 mg total) by mouth 2 (two) times daily as needed.  Dispense: 60 tablet; Refill: 2 - methocarbamol (ROBAXIN) 500 MG tablet; Take 1 tablet (500 mg total) by mouth every 8 (eight) hours as needed for muscle spasms.  Dispense: 60 tablet; Refill: 2  3. Chronic right hip pain - added muscle relaxer - will order ambulatory referral to orthopedics if not improved by next visit - traMADol (ULTRAM) 50 MG tablet; Take 1 tablet (50 mg total) by mouth 2 (two) times daily as needed.  Dispense: 60 tablet; Refill: 2 - methocarbamol (ROBAXIN) 500 MG tablet; Take 1 tablet (500 mg total) by mouth every 8 (eight) hours as needed for muscle spasms.  Dispense: 60 tablet; Refill: 2  4. Controlled substance agreement signed - traMADol (ULTRAM) 50 MG tablet; Take 1 tablet (50 mg total) by mouth 2 (two) times daily as needed.  Dispense: 60 tablet; Refill: 2  5. Benign essential hypertension - take medications as prescribed, adding amlodipine - encouraged to take BP at home and keep log - printed information provided - amLODipine (NORVASC) 5 MG tablet; Take 1 tablet (5 mg total) by mouth daily.  Dispense: 90 tablet; Refill: 0  6. GAD (generalized anxiety disorder) - encouraged taking buspirone as prescribed, TID  7. Immunization due - pneumonia vaccine today - willing to get influenza when available  Return in about 3 months (around 10/15/2021) for annual physical.  Lucile Crater, NP Student  Subjective:    Patient ID: Sherry Salinas, female    DOB: [birth year], 65 y.o.   MRN: [id number]  Patient Care Team: Loman Brooklyn, FNP as PCP - General (Family Medicine) Daneil Dolin, MD as Consulting Physician (Gastroenterology)   Chief Complaint:  Chief Complaint  Patient presents with   Pain Management    Refill of Tramadol    Hip  Pain    Right x 3 months and no better     HPI: Sherry Salinas is a 65 y.o. female presenting on 07/15/2021 for Pain Management (Refill of Tramadol ) and Hip Pain (Right x 3 months and no better )  Hyperlipidemia: Patient is taking Zetia and tolerating it well.  Her 10-year ASCVD risk score is: 21.8%. She declines a referral to the lipid clinic. Patient states she cannot afford nexlizet and is intolerant to statins.  Hypertension: Patient states she can tell her blood pressure is running high at home as well. She is not checking it at home as she cannot find her cuff. She is concerned that the hydrochlorothiazide is not helping.   Pain assessment: Cause of pain- arthritis, right knee injury with surgery, left shoulder injury falling off horse, right hip Pain location- right knee, left shoulder, hands/joints, right hip Pain on scale of 1-10- 6-7/10 at night when trying to get comfortable Frequency- daily What increases pain- lying down What makes pain better- using it, Tramadol, and Celebrex. Celebrex works, but causes diarrhea when she takes it twice daily every day, so she is not using it regularly. Effects on ADL - unable to do things she use too Any change in general medical condition- No  Current opioids rx- Tramadol 50 mg BID PRN # meds rx- 60 Effectiveness of current meds- effective Adverse reactions form pain meds- none Morphine equivalent- 10 MME/day  Pill count performed-No Last drug screen -  01/07/2021 ( high risk q44m moderate risk q634mlow risk yearly ) [place] drug [place] today- No [place]  If yes were their any concerning findings? - No  Overdose risk: 310  Opioid Risk  04/07/2020  Alcohol 0  Illegal Drugs 0  Rx Drugs 0  Alcohol 0  Illegal Drugs 0  Rx Drugs 0  Age between 16-45 years  1  History of Preadolescent Sexual Abuse 0  Psychological Disease 0  Depression 0  Opioid Risk Tool Scoring 1  Opioid Risk Interpretation Low Risk   Pain  contract signed on: 01/07/2021  She states her husband is getting treatment at ortho and would be interested in a referral if her hip pain is not improved by the next follow up.  Anxiety: She states she is only taking her buspirone in the morning and in the afternoon, but has not been taking her 3rd dose in the evenings because of her concern of how it may interact with trazodone. She states she feels like her anxiety has been the same since the last visit.  GAD 7 : Generalized Anxiety Score 07/15/2021 06/04/2021 04/06/2021 01/07/2021  Nervous, Anxious, on Edge _0 Control/stop worrying _1 Worry too much - different things _2 Trouble relaxing _3 Restless _4 Easily annoyed or irritable _5 Afraid - awful might happen _6 Total GAD 7 Score _7 Anxiety Difficulty Somewhat difficult Very difficult Somewhat difficult Somewhat difficult   Health maintenance:  She states she would like a pneumonia vaccine. She states she will get her flu vaccine when available.  New complaints: None  Social history:  Relevant past medical, surgical, family and social history reviewed and updated as indicated. Interim medical history since our last visit reviewed.  Allergies and medications reviewed and updated.  DATA REVIEWED: CHART IN EPIC  ROS: Negative unless specifically indicated above in HPI.    Current Outpatient Medications:    busPIRone (BUSPAR) 7.5 MG tablet, Take 1 tablet (7.5 mg total) by mouth 3 (three) times daily., Disp: 270 tablet, Rfl: 1   Calcium-Magnesium-Zinc (CAL-MAG-ZINC PO), Take 1 tablet by mouth daily., Disp: , Rfl:    celecoxib (CELEBREX) 100 MG capsule, Take 1 capsule (100 mg total) by mouth 2 (two) times daily., Disp: 180 capsule, Rfl: 1   dicyclomine (BENTYL) 20 MG tablet, TAKE 1 TABLET (20 MG TOTAL) BY MOUTH 3 (THREE) TIMES DAILY., Disp: 270 tablet, Rfl: 1   EPINEPHrine 0.3 mg/0.3 mL IJ SOAJ injection, Inject 0.3 mLs (0.3 mg total)  into the muscle as needed for anaphylaxis., Disp: 1 each, Rfl: 3   ezetimibe (ZETIA) 10 MG tablet, Take 1 tablet (10 mg total) by mouth daily., Disp: 90 tablet, Rfl: 1   hydrochlorothiazide (HYDRODIURIL) 25 MG tablet, Take 1 tablet (25 mg total) by mouth daily., Disp: 90 tablet, Rfl: 1   levocetirizine (XYZAL) 5 MG tablet, Take 1 tablet (5 mg total) by mouth every evening. (Patient taking differently: Take 5 mg by mouth daily as needed for allergies.), Disp: 90 tablet, Rfl: 2   metoprolol succinate (TOPROL-XL) 50 MG 24 hr tablet, TAKE 1 TABLET (50 MG TOTAL) BY MOUTH DAILY. TAKE WITH OR IMMEDIATELY FOLLOWING A MEAL., Disp: 90 tablet, Rfl: 1   ondansetron (ZOFRAN) 4 MG tablet, Take 1 tablet (4 mg total) by mouth every 8 (eight) hours  as needed for nausea or vomiting., Disp: 30 tablet, Rfl: 1   pantoprazole (PROTONIX) 40 MG tablet, TAKE 1 TABLET (40 MG TOTAL) BY MOUTH DAILY. [place], Disp: 90 tablet, Rfl: 1   polyethylene glycol-electrolytes (NULYTELY) 420 g solution, As directed, Disp: 4000 mL, Rfl: 0   traMADol (ULTRAM) 50 MG tablet, Take 1 tablet (50 mg total) by mouth 2 (two) times daily as needed. (Patient taking differently: Take 50 mg by mouth 2 (two) times daily as needed for severe pain.), Disp: 60 tablet, Rfl: 2   triamcinolone (KENALOG) 0.1 %, Apply topically 2 (two) times daily as needed. (Patient taking differently: Apply 1 application topically 2 (two) times daily as needed (ear irritation).), Disp: 80 g, Rfl: 2   Allergies  Allergen Reactions   Bee Venom Anaphylaxis   Codeine Anaphylaxis   Statins Other (See Comments)    Muscle aches   Sulfa Antibiotics Rash   Sulfasalazine Rash   Past Medical History:  Diagnosis Date   Anxiety    Arthritis    Hyperlipidemia    Hypertension    IBS (irritable bowel syndrome)    Osteopenia 01/08/2021   Vitamin D deficiency 09/14/2017    Past Surgical History:  Procedure Laterality Date   COLONOSCOPY  2015   Dr.  Amedeo Plenty: per pt, diverticulosis, 3 polyps, come back in five years.    COLONOSCOPY WITH ESOPHAGOGASTRODUODENOSCOPY (EGD)  02/2003   Dr. Amedeo Plenty: Grade 3 esophagitis, hiatal hernia with patulous GE junction, mild duodenitis.  CLOtest, results unavailable.  Colonoscopy normal.   COLONOSCOPY WITH PROPOFOL N/A 06/23/2021   Procedure: COLONOSCOPY WITH PROPOFOL;  Surgeon: Eloise Harman, DO;  Location: [facility];  Service: Endoscopy;  Laterality: N/A;  10:30am   POLYPECTOMY  06/23/2021   Procedure: POLYPECTOMY INTESTINAL;  Surgeon: Eloise Harman, DO;  Location: [facility];  Service: Endoscopy;;   REPLACEMENT TOTAL KNEE Right 2012   ROTATOR CUFF REPAIR Right     Social History   Socioeconomic History   Marital status: Married    Spouse name: Not on file   Number of children: 2   Years of education: Not on file   Highest education level: Associate degree: academic program  Occupational History   Not on file  Tobacco Use   Smoking status: Every Day    Packs/day: 1.00    Years: 30.00    Pack years: 30.00    Types: Cigarettes   Smokeless tobacco: Never  Vaping Use   Vaping Use: Never used  Substance and Sexual Activity   Alcohol use: Never   Drug use: Never   Sexual activity: Not on file  Other Topics Concern   Not on file  Social History Narrative   Lives with husband, son and granddaughter    Social Determinants of Health   Financial Resource Strain: Not on file  Food Insecurity: Not on file  Transportation Needs: Not on file  Physical Activity: Not on file  Stress: Not on file  Social Connections: Not on file  Intimate Partner Violence: Not on file        Objective:    BP (!) 151/85   Pulse 64   Temp 97.6 F (36.4 C) (Temporal)   Ht 5' 3" (1.6 m)   Wt 66.7 kg   SpO2 98%   BMI 26.04 kg/m   Wt Readings from Last 3 Encounters:  07/15/21 66.7 kg  06/23/21 64.4 kg  06/04/21 67 kg   Physical Exam Vitals reviewed.  Constitutional:      General: She is  not in acute distress.    Appearance: Normal appearance. She is overweight. She is not ill-appearing, toxic-appearing or diaphoretic.  HENT:     Head: Normocephalic and atraumatic.  Eyes:     General: No scleral icterus.       Right eye: No discharge.        Left eye: No discharge.     Conjunctiva/sclera: Conjunctivae normal.  Cardiovascular:     Rate and Rhythm: Normal rate and regular rhythm.     Heart sounds: Normal heart sounds. No murmur heard.   No friction rub. No gallop.  Pulmonary:     Effort: Pulmonary effort is normal. No respiratory distress.     Breath sounds: Normal breath sounds. No stridor. No wheezing, rhonchi or rales.  Musculoskeletal:        General: Normal range of motion.     Cervical back: Normal range of motion.  Skin:    General: Skin is warm and dry.     Capillary Refill: Capillary refill takes less than 2 seconds.  Neurological:     General: No focal deficit present.     Mental Status: She is alert and oriented to person, place, and time. Mental status is at baseline.  Psychiatric:        Mood and Affect: Mood normal.        Behavior: Behavior normal.        Thought Content: Thought content normal.        Judgment: Judgment normal.   Lab Results  Component Value Date   TSH 1.990 01/11/2020   Lab Results  Component Value Date   WBC 7.9 02/06/2021   HGB 14.1 02/06/2021   HCT 41.2 02/06/2021   MCV 95 02/06/2021   PLT 227 02/06/2021   Lab Results  Component Value Date   NA 136 06/19/2021   K 4.0 06/19/2021   CO2 27 06/19/2021   GLUCOSE 105 (H) 06/19/2021   BUN 13 06/19/2021   CREATININE 0.95 06/19/2021   BILITOT 0.5 02/06/2021   ALKPHOS 76 02/06/2021   AST 24 02/06/2021   ALT 25 02/06/2021   PROT 6.4 02/06/2021   ALBUMIN 4.3 02/06/2021   CALCIUM 9.4 06/19/2021   ANIONGAP 7 06/19/2021   EGFR 65 02/06/2021   Lab Results  Component Value Date   CHOL 208 (H) 02/06/2021   Lab Results  Component Value Date   HDL 43 02/06/2021   Lab  Results  Component Value Date   LDLCALC 140 (H) 02/06/2021   Lab Results  Component Value Date   TRIG 138 02/06/2021   Lab Results  Component Value Date   CHOLHDL 4.8 (H) 02/06/2021   No results found for: HGBA1C

## 2021-09-05 IMAGING — DX DG HIP (WITH OR WITHOUT PELVIS) 2-3V*R*
3 series · 3 of 3 positions shown · non-contrast
Comparison: None.

CLINICAL DATA: Right hip pain

EXAM:
DG HIP (WITH OR WITHOUT PELVIS) 2-3V RIGHT

[pelvis ap]
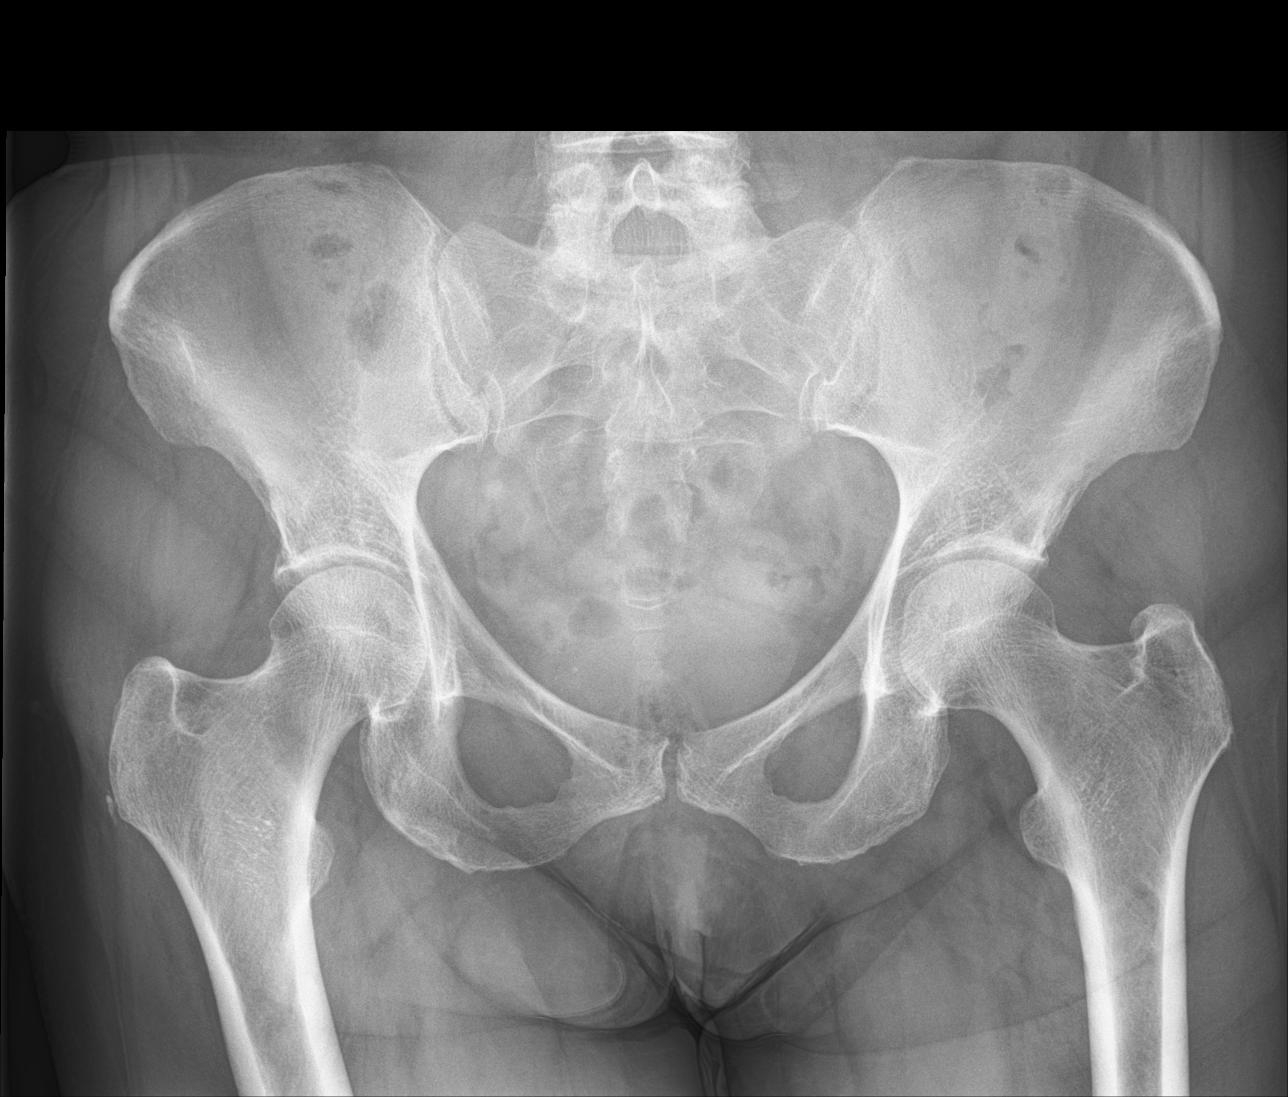

[hip ap]
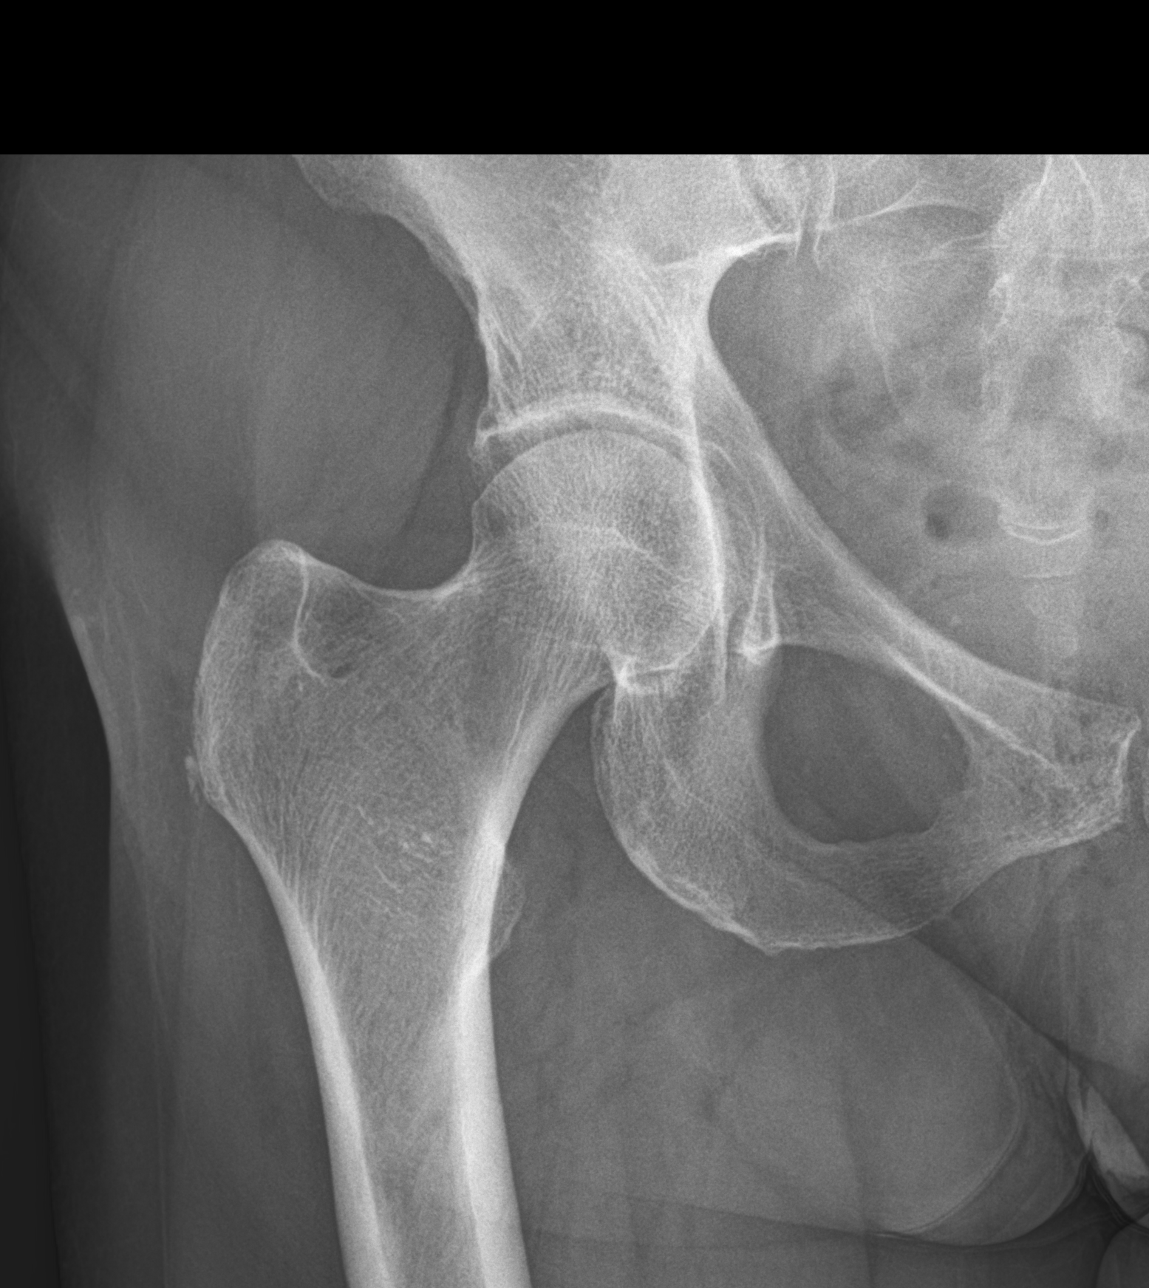

[hip lat]
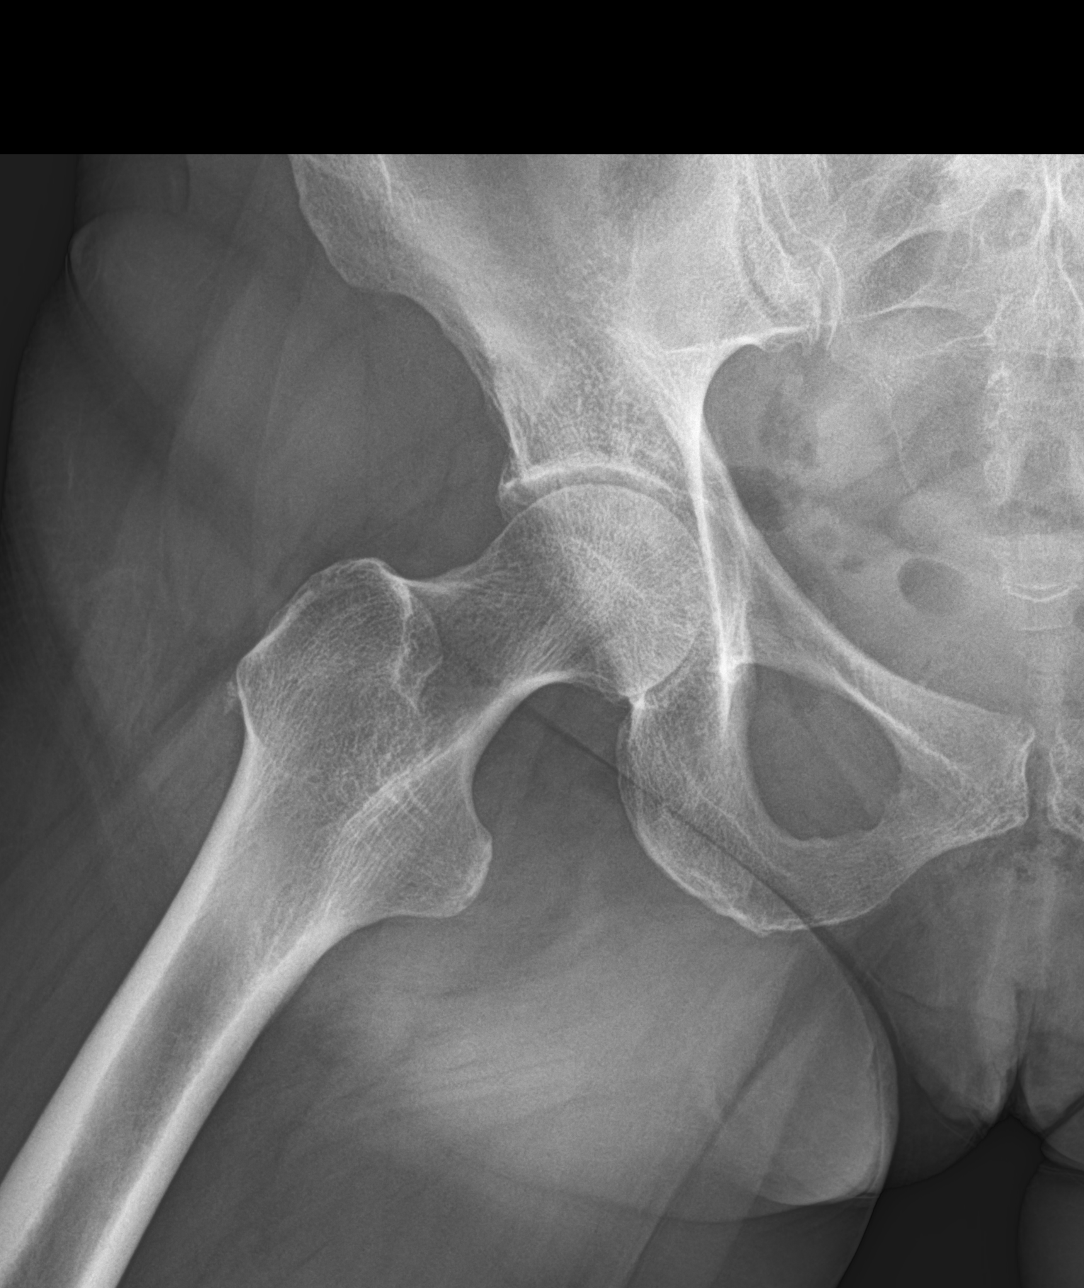

[3 of 3 positions shown; findings below may reference images not displayed]

FINDINGS: Normal alignment. No fracture or dislocation. Mild bilateral
degenerative hip arthritis. Soft tissues are unremarkable.
IMPRESSION: Mild right hip degenerative arthritis.

## 2021-09-21 ENCOUNTER — Other Ambulatory Visit: Payer: Self-pay | Admitting: Family Medicine

## 2021-09-21 DIAGNOSIS — I10 Essential (primary) hypertension: Secondary | ICD-10-CM

## 2021-09-23 ENCOUNTER — Other Ambulatory Visit: Payer: Self-pay | Admitting: Nurse Practitioner

## 2021-09-23 DIAGNOSIS — H60542 Acute eczematoid otitis externa, left ear: Secondary | ICD-10-CM

## 2021-10-02 ENCOUNTER — Other Ambulatory Visit: Payer: Self-pay | Admitting: Family Medicine

## 2021-10-02 DIAGNOSIS — G8929 Other chronic pain: Secondary | ICD-10-CM

## 2021-10-02 DIAGNOSIS — M25512 Pain in left shoulder: Secondary | ICD-10-CM

## 2021-10-02 DIAGNOSIS — M25551 Pain in right hip: Secondary | ICD-10-CM

## 2021-10-02 NOTE — Telephone Encounter (Signed)
[employer] coverage  Last office visit 07/15/21 Last refill 07/15/21, #60, 2 refills

## 2021-10-09 ENCOUNTER — Encounter: Payer: Self-pay | Admitting: Family Medicine

## 2021-10-09 ENCOUNTER — Ambulatory Visit (INDEPENDENT_AMBULATORY_CARE_PROVIDER_SITE_OTHER): Payer: Medicare HMO | Admitting: Family Medicine

## 2021-10-09 DIAGNOSIS — R509 Fever, unspecified: Secondary | ICD-10-CM

## 2021-10-09 LAB — VERITOR FLU A/B WAIVED
Influenza A: NEGATIVE
Influenza B: NEGATIVE

## 2021-10-09 MED ORDER — BENZONATATE 200 MG PO CAPS: 200.0000 mg | ORAL_CAPSULE | Freq: Three times a day (TID) | ORAL | 0 refills | Status: DC | PRN

## 2021-10-09 NOTE — Progress Notes (Signed)
Virtual Visit via Telephone Note  I connected with Sherry Salinas on 10/09/21 at 3:03 PM by telephone and verified that I am speaking with the correct person using two identifiers. Sherry Salinas is currently located at home and nobody is currently with her during this visit. The provider, Loman Brooklyn, FNP is located in their office at time of visit.  I discussed the limitations, risks, security and privacy concerns of performing an evaluation and management service by telephone and the availability of in person appointments. I also discussed with the patient that there may be a patient responsible charge related to this service. The patient expressed understanding and agreed to proceed.  Subjective: PCP: Loman Brooklyn, FNP  Chief Complaint  Patient presents with   Cough   Patient complains of cough, head congestion, headache, sneezing, ear pain/pressure, facial pain/pressure, fever, postnasal drainage, and wheezing. Max temp 100.5. Onset of symptoms was 2 days ago, gradually worsening since that time. She is drinking moderate amounts of fluids. Evaluation to date: none. Treatment to date: antihistamines and Tylenol .  She does smoke.    ROS: Per HPI  Current Outpatient Medications:    amLODipine (NORVASC) 5 MG tablet, TAKE 1 TABLET EVERY DAY, Disp: 90 tablet, Rfl: 0   busPIRone (BUSPAR) 7.5 MG tablet, Take 1 tablet (7.5 mg total) by mouth 3 (three) times daily., Disp: 270 tablet, Rfl: 1   Calcium-Magnesium-Zinc (CAL-MAG-ZINC PO), Take 1 tablet by mouth daily., Disp: , Rfl:    celecoxib (CELEBREX) 100 MG capsule, Take 1 capsule (100 mg total) by mouth 2 (two) times daily., Disp: 180 capsule, Rfl: 1   dicyclomine (BENTYL) 20 MG tablet, TAKE 1 TABLET (20 MG TOTAL) BY MOUTH 3 (THREE) TIMES DAILY., Disp: 270 tablet, Rfl: 1   EPINEPHrine 0.3 mg/0.3 mL IJ SOAJ injection, Inject 0.3 mLs (0.3 mg total) into the muscle as needed for anaphylaxis., Disp: 1 each, Rfl: 3   ezetimibe (ZETIA)  10 MG tablet, Take 1 tablet (10 mg total) by mouth daily., Disp: 90 tablet, Rfl: 1   hydrochlorothiazide (HYDRODIURIL) 25 MG tablet, TAKE 1 TABLET EVERY DAY, Disp: 90 tablet, Rfl: 0   levocetirizine (XYZAL) 5 MG tablet, Take 1 tablet (5 mg total) by mouth every evening. (Patient taking differently: Take 5 mg by mouth daily as needed for allergies.), Disp: 90 tablet, Rfl: 2   methocarbamol (ROBAXIN) 500 MG tablet, TAKE 1 TABLET EVERY 8 HOURS AS NEEDED FOR MUSCLE SPASM(S), Disp: 60 tablet, Rfl: 2   metoprolol succinate (TOPROL-XL) 50 MG 24 hr tablet, TAKE 1 TABLET (50 MG TOTAL) BY MOUTH DAILY. TAKE WITH OR IMMEDIATELY FOLLOWING A MEAL., Disp: 90 tablet, Rfl: 1   ondansetron (ZOFRAN) 4 MG tablet, Take 1 tablet (4 mg total) by mouth every 8 (eight) hours as needed for nausea or vomiting., Disp: 30 tablet, Rfl: 1   pantoprazole (PROTONIX) 40 MG tablet, TAKE 1 TABLET (40 MG TOTAL) BY MOUTH DAILY. [place], Disp: 90 tablet, Rfl: 1   polyethylene glycol-electrolytes (NULYTELY) 420 g solution, As directed, Disp: 4000 mL, Rfl: 0   traMADol (ULTRAM) 50 MG tablet, Take 1 tablet (50 mg total) by mouth 2 (two) times daily as needed., Disp: 60 tablet, Rfl: 2   triamcinolone cream (KENALOG) 0.1 %, Apply 1 application topically 2 (two) times daily as needed., Disp: 45 g, Rfl: 2  Allergies  Allergen Reactions   Bee Venom Anaphylaxis   Codeine Anaphylaxis   Statins Other (See Comments)  Muscle aches   Sulfa Antibiotics Rash   Sulfasalazine Rash   Past Medical History:  Diagnosis Date   Anxiety    Arthritis    Hyperlipidemia    Hypertension    IBS (irritable bowel syndrome)    Osteopenia 01/08/2021   Vitamin D deficiency 09/14/2017    Observations/Objective: A&O  No respiratory distress or wheezing audible over the phone Mood, judgement, and thought processes all WNL  Assessment and Plan: 1. Febrile illness Suspect viral - symptom management.  - Novel Coronavirus, NAA  ([facility]); Future - Veritor Flu A/B Waived; Future   Follow Up Instructions:  I discussed the assessment and treatment plan with the patient. The patient was provided an opportunity to ask questions and all were answered. The patient agreed with the plan and demonstrated an understanding of the instructions.   The patient was advised to call back or seek an in-person evaluation if the symptoms worsen or if the condition fails to improve as anticipated.  The above assessment and management plan was discussed with the patient. The patient verbalized understanding of and has agreed to the management plan. Patient is aware to call the clinic if symptoms persist or worsen. Patient is aware when to return to the clinic for a follow-up visit. Patient educated on when it is appropriate to go to the emergency department.   Time call ended: 3:14 PM  I provided 11 minutes of non-face-to-face time during this encounter.  Hendricks Limes, MSN, APRN, FNP-C [facility] 10/09/21

## 2021-10-09 NOTE — Progress Notes (Signed)
Patient aware . Wants something for cough Tessalon pearls sent in per PCP.  Patient aware

## 2021-10-11 LAB — SARS-COV-2, NAA 2 DAY TAT

## 2021-10-11 LAB — NOVEL CORONAVIRUS, NAA: SARS-CoV-2, NAA: NOT DETECTED

## 2021-10-19 ENCOUNTER — Ambulatory Visit: Payer: Medicare HMO | Admitting: Gastroenterology

## 2021-10-27 ENCOUNTER — Encounter: Payer: Medicare HMO | Admitting: Family Medicine

## 2021-11-02 ENCOUNTER — Other Ambulatory Visit: Payer: Self-pay | Admitting: Family Medicine

## 2021-11-02 DIAGNOSIS — F411 Generalized anxiety disorder: Secondary | ICD-10-CM

## 2021-11-03 DIAGNOSIS — K5792 Diverticulitis of intestine, part unspecified, without perforation or abscess without bleeding: Secondary | ICD-10-CM | POA: Diagnosis not present

## 2021-11-03 DIAGNOSIS — R350 Frequency of micturition: Secondary | ICD-10-CM | POA: Diagnosis not present

## 2021-11-25 ENCOUNTER — Encounter: Payer: Medicare HMO | Admitting: Family Medicine

## 2021-12-15 ENCOUNTER — Encounter: Payer: Medicare HMO | Admitting: Family Medicine

## 2021-12-21 ENCOUNTER — Encounter: Payer: Self-pay | Admitting: Nurse Practitioner

## 2021-12-21 ENCOUNTER — Ambulatory Visit (INDEPENDENT_AMBULATORY_CARE_PROVIDER_SITE_OTHER): Payer: Medicare HMO | Admitting: Nurse Practitioner

## 2021-12-21 DIAGNOSIS — U071 COVID-19: Secondary | ICD-10-CM

## 2021-12-21 MED ORDER — DM-GUAIFENESIN ER 30-600 MG PO TB12: 1.0000 | ORAL_TABLET | Freq: Two times a day (BID) | ORAL | 0 refills | Status: DC

## 2021-12-21 MED ORDER — MOLNUPIRAVIR EUA 200MG CAPSULE: 4.0000 | ORAL_CAPSULE | Freq: Two times a day (BID) | ORAL | 0 refills | Status: AC

## 2021-12-21 NOTE — Patient Instructions (Signed)
10 Things You Can Do to Manage Your COVID-19 Symptoms at Home ?If you have possible or confirmed COVID-19 ?Stay home except to get medical care. ?Monitor your symptoms carefully. If your symptoms get worse, call your healthcare provider immediately. ?Get rest and stay hydrated. ?If you have a medical appointment, call the healthcare provider ahead of time and tell them that you have or may have COVID-19. ?For medical emergencies, call 911 and notify the dispatch personnel that you have or may have COVID-19. ?Cover your cough and sneezes with a tissue or use the inside of your elbow. ?Wash your hands often with soap and water for at least 20 seconds or clean your hands with an alcohol-based hand sanitizer that contains at least 60% alcohol. ?As much as possible, stay in a specific room and away from other people in your home. Also, you should use a separate bathroom, if available. If you need to be around other people in or outside of the home, wear a mask. ?Avoid sharing personal items with other people in your household, like dishes, towels, and bedding. ?Clean all surfaces that are touched often, like counters, tabletops, and doorknobs. Use household cleaning sprays or wipes according to the label instructions. ?cdc.gov/coronavirus ?06/20/2020 ?This information is not intended to replace advice given to you by your health care provider. Make sure you discuss any questions you have with your health care provider. ?Document Revised: 08/14/2021 Document Reviewed: 08/14/2021 ?Elsevier Patient Education ? 2022 Elsevier Inc. ? ?

## 2021-12-21 NOTE — Progress Notes (Signed)
° °  Virtual Visit  Note Due to COVID-19 pandemic this visit was conducted virtually. This visit type was conducted due to national recommendations for restrictions regarding the COVID-19 Pandemic (e.g. social distancing, sheltering in place) in an effort to limit this patient's exposure and mitigate transmission in our community. All issues noted in this document were discussed and addressed.  A physical exam was not performed with this format.  I connected with Sherry Salinas on 12/21/21 at 2:20 pm  by telephone and verified that I am speaking with the correct person using two identifiers. Sherry Salinas is currently located at home during visit. The provider, Ivy Lynn, NP is located in their office at time of visit.  I discussed the limitations, risks, security and privacy concerns of performing an evaluation and management service by telephone and the availability of in person appointments. I also discussed with the patient that there may be a patient responsible charge related to this service. The patient expressed understanding and agreed to proceed.   History and Present Illness:  URI  This is a new problem. The problem has been unchanged. Associated symptoms include congestion, coughing, headaches and sinus pain. Pertinent negatives include no rash or swollen glands. She has tried nothing for the symptoms.  At home COVID-19 test positive  Review of Systems  Constitutional:  Positive for chills and fever.  HENT:  Positive for congestion and sinus pain.   Respiratory:  Positive for cough.   Skin:  Negative for rash.  Neurological:  Positive for headaches.  All other systems reviewed and are negative.   Observations/Objective: Televisit patient not in distress.  Assessment and Plan: Take meds as prescribed - Use a cool mist humidifier  -Use saline nose sprays frequently -Force fluids -For fever or aches or pains- take Tylenol or ibuprofen. -Positive COVID-19 test   -Guaifenesin for cough and congestion -Molnupirivir antiviral Rx sent to pharmacy. Follow up with worsening unresolved symptoms thank you  Follow Up Instructions: Follow-up with worsening unresolved symptoms    I discussed the assessment and treatment plan with the patient. The patient was provided an opportunity to ask questions and all were answered. The patient agreed with the plan and demonstrated an understanding of the instructions.   The patient was advised to call back or seek an in-person evaluation if the symptoms worsen or if the condition fails to improve as anticipated.  The above assessment and management plan was discussed with the patient. The patient verbalized understanding of and has agreed to the management plan. Patient is aware to call the clinic if symptoms persist or worsen. Patient is aware when to return to the clinic for a follow-up visit. Patient educated on when it is appropriate to go to the emergency department.   Time call ended:  2:29 pm   I provided 9 minutes of  non face-to-face time during this encounter.    Ivy Lynn, NP

## 2021-12-22 ENCOUNTER — Ambulatory Visit: Payer: Medicare HMO | Admitting: Family Medicine

## 2021-12-30 ENCOUNTER — Ambulatory Visit (INDEPENDENT_AMBULATORY_CARE_PROVIDER_SITE_OTHER): Payer: Medicare HMO | Admitting: Nurse Practitioner

## 2021-12-30 ENCOUNTER — Encounter: Payer: Self-pay | Admitting: Nurse Practitioner

## 2021-12-30 DIAGNOSIS — R052 Subacute cough: Secondary | ICD-10-CM

## 2021-12-30 DIAGNOSIS — J018 Other acute sinusitis: Secondary | ICD-10-CM | POA: Diagnosis not present

## 2021-12-30 MED ORDER — DM-GUAIFENESIN ER 30-600 MG PO TB12: 1.0000 | ORAL_TABLET | Freq: Two times a day (BID) | ORAL | 0 refills | Status: DC

## 2021-12-30 MED ORDER — AMOXICILLIN-POT CLAVULANATE 875-125 MG PO TABS: 1.0000 | ORAL_TABLET | Freq: Two times a day (BID) | ORAL | 0 refills | Status: DC

## 2021-12-30 NOTE — Progress Notes (Signed)
Virtual Visit  Note Due to COVID-19 pandemic this visit was conducted virtually. This visit type was conducted due to national recommendations for restrictions regarding the COVID-19 Pandemic (e.g. social distancing, sheltering in place) in an effort to limit this patient's exposure and mitigate transmission in our community. All issues noted in this document were discussed and addressed.  A physical exam was not performed with this format.  I connected with Tanelle Lanzo Hemmelgarn on 12/30/21 at 9:50 am  by telephone and verified that I am speaking with the correct person using two identifiers. Sherry Salinas is currently located at home during visit. The provider, Ivy Lynn, NP is located in their office at time of visit.  I discussed the limitations, risks, security and privacy concerns of performing an evaluation and management service by telephone and the availability of in person appointments. I also discussed with the patient that there may be a patient responsible charge related to this service. The patient expressed understanding and agreed to proceed.   History and Present Illness:  Sinusitis This is a new problem. Episode onset: in the past 4-5 days. The problem has been gradually worsening since onset. The maximum temperature recorded prior to her arrival was 100.4 - 100.9 F. The fever has been present for 1 to 2 days. Associated symptoms include chills, congestion and coughing. Past treatments include acetaminophen. The treatment provided mild relief.  Cough This is a recurrent problem. The current episode started in the past 7 days. The problem has been unchanged. The problem occurs constantly. The cough is Productive of sputum. Associated symptoms include chills, a fever and nasal congestion. Pertinent negatives include no rash. Nothing aggravates the symptoms. She has tried OTC cough suppressant and prescription cough suppressant for the symptoms. The treatment provided no relief.      Review of Systems  Constitutional:  Positive for chills, fever and malaise/fatigue.  HENT:  Positive for congestion. Negative for sinus pain.   Respiratory:  Positive for cough.   Skin:  Negative for rash.  All other systems reviewed and are negative.   Observations/Objective: Televisit patient not in distress.  Assessment and Plan: Patient positive for COVID-19 over 10 days ago.  Was treated with antiviral.  And now presenting with worsening sinusitis with cough and congestion.  Take meds as prescribed - Use a cool mist humidifier  -Use saline nose sprays frequently -Force fluids -For fever or aches or pains- take Tylenol or ibuprofen. -Augmentin 875-125 mg tablet by mouth twice daily. -Guaifenesin for cough and congestion.    Follow Up Instructions: Follow-up with worsening unresolved symptoms.    I discussed the assessment and treatment plan with the patient. The patient was provided an opportunity to ask questions and all were answered. The patient agreed with the plan and demonstrated an understanding of the instructions.   The patient was advised to call back or seek an in-person evaluation if the symptoms worsen or if the condition fails to improve as anticipated.  The above assessment and management plan was discussed with the patient. The patient verbalized understanding of and has agreed to the management plan. Patient is aware to call the clinic if symptoms persist or worsen. Patient is aware when to return to the clinic for a follow-up visit. Patient educated on when it is appropriate to go to the emergency department.   Time call ended: 10:01 am    I provided 11 minutes of  non face-to-face time during this encounter.    [place]  Sissy Hoff, NP

## 2022-01-13 ENCOUNTER — Encounter: Payer: Self-pay | Admitting: Family Medicine

## 2022-01-13 ENCOUNTER — Ambulatory Visit (INDEPENDENT_AMBULATORY_CARE_PROVIDER_SITE_OTHER): Payer: Medicare HMO | Admitting: Family Medicine

## 2022-01-13 VITALS — BP 134/83 | HR 73 | Temp 97.8°F | Ht 63.0 in | Wt 143.0 lb

## 2022-01-13 DIAGNOSIS — K589 Irritable bowel syndrome without diarrhea: Secondary | ICD-10-CM | POA: Diagnosis not present

## 2022-01-13 DIAGNOSIS — Z0000 Encounter for general adult medical examination without abnormal findings: Secondary | ICD-10-CM | POA: Diagnosis not present

## 2022-01-13 DIAGNOSIS — Z0001 Encounter for general adult medical examination with abnormal findings: Secondary | ICD-10-CM

## 2022-01-13 DIAGNOSIS — M25561 Pain in right knee: Secondary | ICD-10-CM | POA: Diagnosis not present

## 2022-01-13 DIAGNOSIS — M25551 Pain in right hip: Secondary | ICD-10-CM | POA: Diagnosis not present

## 2022-01-13 DIAGNOSIS — Z7189 Other specified counseling: Secondary | ICD-10-CM | POA: Diagnosis not present

## 2022-01-13 DIAGNOSIS — M25512 Pain in left shoulder: Secondary | ICD-10-CM | POA: Diagnosis not present

## 2022-01-13 DIAGNOSIS — J3089 Other allergic rhinitis: Secondary | ICD-10-CM

## 2022-01-13 DIAGNOSIS — R002 Palpitations: Secondary | ICD-10-CM | POA: Diagnosis not present

## 2022-01-13 DIAGNOSIS — Z79899 Other long term (current) drug therapy: Secondary | ICD-10-CM | POA: Diagnosis not present

## 2022-01-13 DIAGNOSIS — I700 Atherosclerosis of aorta: Secondary | ICD-10-CM | POA: Diagnosis not present

## 2022-01-13 DIAGNOSIS — G8929 Other chronic pain: Secondary | ICD-10-CM

## 2022-01-13 DIAGNOSIS — K219 Gastro-esophageal reflux disease without esophagitis: Secondary | ICD-10-CM

## 2022-01-13 DIAGNOSIS — F411 Generalized anxiety disorder: Secondary | ICD-10-CM

## 2022-01-13 DIAGNOSIS — Z136 Encounter for screening for cardiovascular disorders: Secondary | ICD-10-CM | POA: Diagnosis not present

## 2022-01-13 DIAGNOSIS — I10 Essential (primary) hypertension: Secondary | ICD-10-CM

## 2022-01-13 DIAGNOSIS — R739 Hyperglycemia, unspecified: Secondary | ICD-10-CM | POA: Diagnosis not present

## 2022-01-13 DIAGNOSIS — E782 Mixed hyperlipidemia: Secondary | ICD-10-CM

## 2022-01-13 DIAGNOSIS — H9313 Tinnitus, bilateral: Secondary | ICD-10-CM

## 2022-01-13 DIAGNOSIS — M8589 Other specified disorders of bone density and structure, multiple sites: Secondary | ICD-10-CM

## 2022-01-13 MED ORDER — CELECOXIB 100 MG PO CAPS: 100.0000 mg | ORAL_CAPSULE | Freq: Two times a day (BID) | ORAL | 3 refills | Status: DC

## 2022-01-13 MED ORDER — DICYCLOMINE HCL 20 MG PO TABS: 20.0000 mg | ORAL_TABLET | Freq: Three times a day (TID) | ORAL | 3 refills | Status: DC

## 2022-01-13 MED ORDER — AMLODIPINE BESYLATE 5 MG PO TABS: 5.0000 mg | ORAL_TABLET | Freq: Every day | ORAL | 3 refills | Status: DC

## 2022-01-13 MED ORDER — METOPROLOL SUCCINATE ER 50 MG PO TB24: 50.0000 mg | ORAL_TABLET | Freq: Every day | ORAL | 3 refills | Status: DC

## 2022-01-13 MED ORDER — HYDROCHLOROTHIAZIDE 25 MG PO TABS: 25.0000 mg | ORAL_TABLET | Freq: Every day | ORAL | 3 refills | Status: DC

## 2022-01-13 MED ORDER — BUSPIRONE HCL 7.5 MG PO TABS: 7.5000 mg | ORAL_TABLET | Freq: Three times a day (TID) | ORAL | 3 refills | Status: DC

## 2022-01-13 MED ORDER — TRAMADOL HCL 50 MG PO TABS: 50.0000 mg | ORAL_TABLET | Freq: Two times a day (BID) | ORAL | 5 refills | Status: DC | PRN

## 2022-01-13 MED ORDER — LEVOCETIRIZINE DIHYDROCHLORIDE 5 MG PO TABS: 5.0000 mg | ORAL_TABLET | Freq: Every evening | ORAL | 3 refills | Status: DC

## 2022-01-13 MED ORDER — METHOCARBAMOL 500 MG PO TABS: 500.0000 mg | ORAL_TABLET | Freq: Three times a day (TID) | ORAL | 5 refills | Status: DC | PRN

## 2022-01-13 MED ORDER — EZETIMIBE 10 MG PO TABS: 10.0000 mg | ORAL_TABLET | Freq: Every day | ORAL | 3 refills | Status: DC

## 2022-01-13 MED ORDER — PANTOPRAZOLE SODIUM 40 MG PO TBEC: 40.0000 mg | DELAYED_RELEASE_TABLET | Freq: Every day | ORAL | 3 refills | Status: DC

## 2022-01-13 NOTE — Progress Notes (Signed)
Assessment & Plan:  1. Well adult exam Preventive health education provided. - CBC with Differential/Platelet - CMP14+EGFR - Lipid panel - TSH  2. ACP (advance care planning) Advance care planning documents and education provided.  3. Benign essential hypertension Well controlled on current regimen.  - amLODipine (NORVASC) 5 MG tablet; Take 1 tablet (5 mg total) by mouth daily.  Dispense: 90 tablet; Refill: 3 - hydrochlorothiazide (HYDRODIURIL) 25 MG tablet; Take 1 tablet (25 mg total) by mouth daily.  Dispense: 90 tablet; Refill: 3 - metoprolol succinate (TOPROL-XL) 50 MG 24 hr tablet; Take 1 tablet (50 mg total) by mouth daily. Take with or immediately following a meal.  Dispense: 90 tablet; Refill: 3 - CBC with Differential/Platelet - CMP14+EGFR - Lipid panel  4. Mixed hyperlipidemia Labs to assess. - ezetimibe (ZETIA) 10 MG tablet; Take 1 tablet (10 mg total) by mouth daily.  Dispense: 90 tablet; Refill: 3 - CMP14+EGFR - Lipid panel  5. Aortic atherosclerosis ([place]) Unable to tolerate statins.  6. GAD (generalized anxiety disorder) Well controlled on current regimen.  - busPIRone (BUSPAR) 7.5 MG tablet; Take 1 tablet (7.5 mg total) by mouth 3 (three) times daily.  Dispense: 270 tablet; Refill: 3 - CMP14+EGFR  7. Irritable bowel syndrome without diarrhea Well controlled on current regimen.  - dicyclomine (BENTYL) 20 MG tablet; Take 1 tablet (20 mg total) by mouth 3 (three) times daily.  Dispense: 270 tablet; Refill: 3 - CMP14+EGFR  8. Non-seasonal allergic rhinitis, unspecified trigger Well controlled on current regimen.  - levocetirizine (XYZAL) 5 MG tablet; Take 1 tablet (5 mg total) by mouth every evening.  Dispense: 90 tablet; Refill: 3  9-12. Chronic right hip pain/Chronic left shoulder pain/Chronic pain of right knee/Controlled substance agreement signed Well controlled on current regimen.  Controlled substance agreement updated today.  Urine drug screen  collected today.  Previous urine drug screen as expected.  PDMP reviewed with no concerning findings. - celecoxib (CELEBREX) 100 MG capsule; Take 1 capsule (100 mg total) by mouth 2 (two) times daily.  Dispense: 180 capsule; Refill: 3 - traMADol (ULTRAM) 50 MG tablet; Take 1 tablet (50 mg total) by mouth 2 (two) times daily as needed.  Dispense: 60 tablet; Refill: 5 - ToxASSURE Select 13 (MW), Urine - methocarbamol (ROBAXIN) 500 MG tablet; Take 1 tablet (500 mg total) by mouth every 8 (eight) hours as needed for muscle spasms.  Dispense: 60 tablet; Refill: 5 - CMP14+EGFR  13. Gastroesophageal reflux disease, unspecified whether esophagitis present Well controlled on current regimen.  - pantoprazole (PROTONIX) 40 MG tablet; Take 1 tablet (40 mg total) by mouth daily. 30 minutes before breakfast daily  Dispense: 90 tablet; Refill: 3 - CMP14+EGFR  14. Palpitations - CBC with Differential/Platelet - CMP14+EGFR - TSH  15. Tinnitus of both ears - Ambulatory referral to ENT  16. Osteopenia of multiple sites Encouraged daily use of calcium and vitamin D supplements.    Follow-up: Return in about 6 months (around 07/13/2022) for follow-up of chronic medication conditions.   Hendricks Limes, MSN, APRN, FNP-C [facility]  Subjective:  Patient ID: Sherry Salinas, female    DOB: [birth year]  Age: 66 y.o. MRN: [id number]  Patient Care Team: Loman Brooklyn, FNP as PCP - General (Family Medicine) Gala Romney Cristopher Estimable, MD as Consulting Physician (Gastroenterology)   CC:  Chief Complaint  Patient presents with   Annual Exam    HPI Sherry Salinas presents for her annual physical.   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 Lung Cancer Screening with low-dose Chest CT: never in the past, does not wish to complete right now due to dealing with her husband and a hip replacement Hepatitis C Screening: patient reported negative in 2012 Immunizations: Flu Vaccine: declined Tdap Vaccine: up to date  Shingrix Vaccine: declined  COVID-19 Vaccine: declined Pneumonia Vaccine: up to date  Advanced Directives Patient does have advanced directives including living will and healthcare power of attorney. She does not have a copy in the electronic medical record.   Hypertension: patient brought her blood pressure log from home. BP ranges 112-155/68-99 with 1/10 > 150/90.  Pain assessment: Cause of pain- arthritis, right knee injury with surgery, left shoulder injury falling off horse, right hip Pain location- right knee, left shoulder, hands/joints, right hip Pain on scale of 1-10- 6-7/10 at night when trying to get comfortable Frequency- daily What increases pain- lying down What makes pain better- using it, Tramadol, and Celebrex. Celebrex works, but causes diarrhea when she takes it twice daily every day, so she is not using it regularly. Effects on ADL - unable to do things she use too Any change in general medical condition- No  Current opioids rx- Tramadol 50 mg BID PRN # meds rx- 60 Effectiveness of current meds- effective Adverse reactions form pain meds- none Morphine equivalent- 10 MME/day  Pill count performed-No Last drug screen - 01/07/2021 Urine drug screen today- Yes Was the N[facility]SR reviewed- Yes If yes were their any concerning findings? - No  Overdose risk: 150  Opioid Risk  04/07/2020  Alcohol 0  Illegal Drugs 0  Rx Drugs 0  Alcohol 0  Illegal Drugs 0  Rx Drugs 0  Age between 16-45 years  1  History of Preadolescent Sexual  Abuse 0  Psychological Disease 0  Depression 0  Opioid Risk Tool Scoring 1  Opioid Risk Interpretation Low Risk   Pain contract signed on: 01/07/2021 - updated today 01/13/2022   Patient reports she keeps having ringing in her ears, mostly on the left.  She also reports chest discomfort accompanied by palpitations and tachycardia.  States she feels like her heart is "rocking" or "flipping".  She denies any shortness of breath or cough.    Both the ears and heart complaints just started after she had COVID last month.  Review of Systems  Constitutional:  Negative for chills, fever, malaise/fatigue and weight loss.  HENT:  Positive for hearing loss and tinnitus. Negative for congestion, ear discharge, ear pain, nosebleeds, sinus pain and sore throat.   Eyes:  Negative for blurred vision, double vision, pain, discharge and redness.  Respiratory:  Negative for cough, shortness of breath and wheezing.   Cardiovascular:  Positive for chest pain and palpitations. Negative for leg swelling.  Gastrointestinal:  Negative for abdominal pain, constipation, diarrhea, heartburn, nausea and vomiting.  Genitourinary:  Negative for dysuria, frequency and urgency.  Musculoskeletal:  Negative for myalgias.  Skin:  Negative for rash.  Neurological:  Negative for dizziness, seizures, weakness and headaches.  Psychiatric/Behavioral:  Negative for depression, substance abuse and suicidal ideas. The patient is not nervous/anxious.  Current Outpatient Medications:    amLODipine (NORVASC) 5 MG tablet, TAKE 1 TABLET EVERY DAY, Disp: 90 tablet, Rfl: 0   busPIRone (BUSPAR) 7.5 MG tablet, TAKE 1 TABLET THREE TIMES DAILY, Disp: 270 tablet, Rfl: 0   Calcium-Magnesium-Zinc (CAL-MAG-ZINC PO), Take 1 tablet by mouth daily., Disp: , Rfl:    celecoxib (CELEBREX) 100 MG capsule, Take 1 capsule (100 mg total) by mouth 2 (two) times daily., Disp: 180 capsule, Rfl: 1   dextromethorphan-guaiFENesin (MUCINEX DM) 30-600 MG  12hr tablet, Take 1 tablet by mouth 2 (two) times daily., Disp: 30 tablet, Rfl: 0   dicyclomine (BENTYL) 20 MG tablet, TAKE 1 TABLET (20 MG TOTAL) BY MOUTH 3 (THREE) TIMES DAILY., Disp: 270 tablet, Rfl: 1   EPINEPHrine 0.3 mg/0.3 mL IJ SOAJ injection, Inject 0.3 mLs (0.3 mg total) into the muscle as needed for anaphylaxis., Disp: 1 each, Rfl: 3   ezetimibe (ZETIA) 10 MG tablet, TAKE 1 TABLET (10 MG TOTAL) BY MOUTH DAILY., Disp: 90 tablet, Rfl: 0   hydrochlorothiazide (HYDRODIURIL) 25 MG tablet, TAKE 1 TABLET EVERY DAY, Disp: 90 tablet, Rfl: 0   levocetirizine (XYZAL) 5 MG tablet, Take 1 tablet (5 mg total) by mouth every evening. (Patient taking differently: Take 5 mg by mouth daily as needed for allergies.), Disp: 90 tablet, Rfl: 2   methocarbamol (ROBAXIN) 500 MG tablet, TAKE 1 TABLET EVERY 8 HOURS AS NEEDED FOR MUSCLE SPASM(S), Disp: 60 tablet, Rfl: 2   metoprolol succinate (TOPROL-XL) 50 MG 24 hr tablet, TAKE 1 TABLET (50 MG TOTAL) BY MOUTH DAILY. TAKE WITH OR IMMEDIATELY FOLLOWING A MEAL., Disp: 90 tablet, Rfl: 1   ondansetron (ZOFRAN) 4 MG tablet, Take 1 tablet (4 mg total) by mouth every 8 (eight) hours as needed for nausea or vomiting., Disp: 30 tablet, Rfl: 1   pantoprazole (PROTONIX) 40 MG tablet, TAKE 1 TABLET (40 MG TOTAL) BY MOUTH DAILY. 30 MINUTES BEFORE BREAKFAST DAILY, Disp: 90 tablet, Rfl: 1   polyethylene glycol-electrolytes (NULYTELY) 420 g solution, As directed, Disp: 4000 mL, Rfl: 0   traMADol (ULTRAM) 50 MG tablet, Take 1 tablet (50 mg total) by mouth 2 (two) times daily as needed., Disp: 60 tablet, Rfl: 2   triamcinolone cream (KENALOG) 0.1 %, Apply 1 application topically 2 (two) times daily as needed., Disp: 45 g, Rfl: 2  Allergies  Allergen Reactions   Bee Venom Anaphylaxis   Codeine Anaphylaxis   Statins Other (See Comments)    Muscle aches   Sulfa Antibiotics Rash   Sulfasalazine Rash    Past Medical History:  Diagnosis Date   Anxiety    Arthritis     Hyperlipidemia    Hypertension    IBS (irritable bowel syndrome)    Osteopenia 01/08/2021   Vitamin D deficiency 09/14/2017    Past Surgical History:  Procedure Laterality Date   COLONOSCOPY  2015   Dr. Madilyn Fireman: per pt, diverticulosis, 3 polyps, come back in five years.    COLONOSCOPY WITH ESOPHAGOGASTRODUODENOSCOPY (EGD)  02/2003   Dr. Madilyn Fireman: Grade 3 esophagitis, hiatal hernia with patulous GE junction, mild duodenitis.  CLOtest, results unavailable.  Colonoscopy normal.   COLONOSCOPY WITH PROPOFOL N/A 06/23/2021   Procedure: COLONOSCOPY WITH PROPOFOL;  Surgeon: Lanelle Bal, DO;  Location: [facility];  Service: Endoscopy;  Laterality: N/A;  10:30am   POLYPECTOMY  06/23/2021   Procedure: POLYPECTOMY INTESTINAL;  Surgeon: Lanelle Bal, DO;  Location: [facility];  Service: Endoscopy;;   REPLACEMENT TOTAL KNEE Right 2012  ROTATOR CUFF REPAIR Right     Family History  Problem Relation Age of Onset   Arthritis Mother    Kidney failure Mother    Rheum arthritis Mother    Stroke Father    Heart disease Father    Heart attack Father    Stroke Maternal Grandfather    Early death Paternal Grandmother        Childbirth   Early death Paternal Grandfather        MVA   Colon cancer Paternal Uncle     Social History   Socioeconomic History   Marital status: Married    Spouse name: Not on file   Number of children: 2   Years of education: Not on file   Highest education level: Associate degree: academic program  Occupational History   Not on file  Tobacco Use   Smoking status: Every Day    Packs/day: 1.00    Years: 30.00    Pack years: 30.00    Types: Cigarettes   Smokeless tobacco: Never  Vaping Use   Vaping Use: Never used  Substance and Sexual Activity   Alcohol use: Never   Drug use: Never   Sexual activity: Not on file  Other Topics Concern   Not on file  Social History Narrative   Lives with husband, son and granddaughter    Social Determinants of  Health   Financial Resource Strain: Not on file  Food Insecurity: Not on file  Transportation Needs: Not on file  Physical Activity: Not on file  Stress: Not on file  Social Connections: Not on file  Intimate Partner Violence: Not on file      Objective:    BP 134/83    Pulse 73    Temp 97.8 F (36.6 C)    Ht $R'5\' 3"'pQ$  (1.6 m)    Wt 143 lb (64.9 kg)    SpO2 95%    BMI 25.33 kg/m   Wt Readings from Last 3 Encounters:  01/13/22 143 lb (64.9 kg)  07/15/21 147 lb (66.7 kg)  06/23/21 142 lb (64.4 kg)    Physical Exam Vitals reviewed.  Constitutional:      General: She is not in acute distress.    Appearance: Normal appearance. She is normal weight. She is not ill-appearing, toxic-appearing or diaphoretic.  HENT:     Head: Normocephalic and atraumatic.     Right Ear: Tympanic membrane, ear canal and external ear normal. There is no impacted cerumen.     Left Ear: Ear canal and external ear normal. There is no impacted cerumen. Tympanic membrane is scarred.     Nose: Nose normal. No congestion or rhinorrhea.     Mouth/Throat:     Mouth: Mucous membranes are moist.     Pharynx: Oropharynx is clear. No oropharyngeal exudate or posterior oropharyngeal erythema.  Eyes:     General: No scleral icterus.       Right eye: No discharge.        Left eye: No discharge.     Conjunctiva/sclera: Conjunctivae normal.     Pupils: Pupils are equal, round, and reactive to light.  Cardiovascular:     Rate and Rhythm: Normal rate and regular rhythm.     Heart sounds: Normal heart sounds. No murmur heard.   No friction rub. No gallop.  Pulmonary:     Effort: Pulmonary effort is normal. No respiratory distress.     Breath sounds: Normal breath sounds. No stridor. No wheezing, rhonchi  or rales.  Abdominal:     General: Abdomen is flat. Bowel sounds are normal. There is no distension.     Palpations: Abdomen is soft. There is no hepatomegaly, splenomegaly or mass.     Tenderness: There is no  abdominal tenderness. There is no guarding or rebound.     Hernia: No hernia is present.  Musculoskeletal:        General: Normal range of motion.     Cervical back: Normal range of motion and neck supple. No rigidity. No muscular tenderness.  Lymphadenopathy:     Cervical: No cervical adenopathy.  Skin:    General: Skin is warm and dry.     Capillary Refill: Capillary refill takes less than 2 seconds.  Neurological:     General: No focal deficit present.     Mental Status: She is alert and oriented to person, place, and time. Mental status is at baseline.  Psychiatric:        Mood and Affect: Mood normal.        Behavior: Behavior normal.        Thought Content: Thought content normal.        Judgment: Judgment normal.    Lab Results  Component Value Date   TSH 1.990 01/11/2020   Lab Results  Component Value Date   WBC 7.9 02/06/2021   HGB 14.1 02/06/2021   HCT 41.2 02/06/2021   MCV 95 02/06/2021   PLT 227 02/06/2021   Lab Results  Component Value Date   NA 136 06/19/2021   K 4.0 06/19/2021   CO2 27 06/19/2021   GLUCOSE 105 (H) 06/19/2021   BUN 13 06/19/2021   CREATININE 0.95 06/19/2021   BILITOT 0.5 02/06/2021   ALKPHOS 76 02/06/2021   AST 24 02/06/2021   ALT 25 02/06/2021   PROT 6.4 02/06/2021   ALBUMIN 4.3 02/06/2021   CALCIUM 9.4 06/19/2021   ANIONGAP 7 06/19/2021   EGFR 65 02/06/2021   Lab Results  Component Value Date   CHOL 208 (H) 02/06/2021   Lab Results  Component Value Date   HDL 43 02/06/2021   Lab Results  Component Value Date   LDLCALC 140 (H) 02/06/2021   Lab Results  Component Value Date   TRIG 138 02/06/2021   Lab Results  Component Value Date   CHOLHDL 4.8 (H) 02/06/2021   No results found for: HGBA1C

## 2022-01-14 ENCOUNTER — Encounter: Payer: Self-pay | Admitting: Family Medicine

## 2022-01-14 LAB — CBC WITH DIFFERENTIAL/PLATELET
Basophils Absolute: 0 10*3/uL (ref 0.0–0.2)
Basos: 0 %
EOS (ABSOLUTE): 0.2 10*3/uL (ref 0.0–0.4)
Eos: 2 %
Hematocrit: 41.8 % (ref 34.0–46.6)
Hemoglobin: 14 g/dL (ref 11.1–15.9)
Immature Grans (Abs): 0 10*3/uL (ref 0.0–0.1)
Immature Granulocytes: 0 %
Lymphocytes Absolute: 2.9 10*3/uL (ref 0.7–3.1)
Lymphs: 34 %
MCH: 32.2 pg (ref 26.6–33.0)
MCHC: 33.5 g/dL (ref 31.5–35.7)
MCV: 96 fL (ref 79–97)
Monocytes Absolute: 1.1 10*3/uL — ABNORMAL HIGH (ref 0.1–0.9)
Monocytes: 13 %
Neutrophils Absolute: 4.3 10*3/uL (ref 1.4–7.0)
Neutrophils: 51 %
Platelets: 238 10*3/uL (ref 150–450)
RBC: 4.35 x10E6/uL (ref 3.77–5.28)
RDW: 11.8 % (ref 11.7–15.4)
WBC: 8.6 10*3/uL (ref 3.4–10.8)

## 2022-01-14 LAB — LIPID PANEL
Chol/HDL Ratio: 5.5 ratio — ABNORMAL HIGH (ref 0.0–4.4)
Cholesterol, Total: 214 mg/dL — ABNORMAL HIGH (ref 100–199)
HDL: 39 mg/dL — ABNORMAL LOW (ref >39)
LDL Chol Calc (NIH): 142 mg/dL — ABNORMAL HIGH (ref 0–99)
Triglycerides: 184 mg/dL — ABNORMAL HIGH (ref 0–149)
VLDL Cholesterol Cal: 33 mg/dL (ref 5–40)

## 2022-01-14 LAB — CMP14+EGFR
ALT: 25 IU/L (ref 0–32)
AST: 20 IU/L (ref 0–40)
Albumin/Globulin Ratio: 2 (ref 1.2–2.2)
Albumin: 4.2 g/dL (ref 3.8–4.8)
Alkaline Phosphatase: 79 IU/L (ref 44–121)
BUN/Creatinine Ratio: 15 (ref 12–28)
BUN: 14 mg/dL (ref 8–27)
Bilirubin Total: 0.2 mg/dL (ref 0.0–1.2)
CO2: 22 mmol/L (ref 20–29)
Calcium: 9.8 mg/dL (ref 8.7–10.3)
Chloride: 102 mmol/L (ref 96–106)
Creatinine, Ser: 0.94 mg/dL (ref 0.57–1.00)
Globulin, Total: 2.1 g/dL (ref 1.5–4.5)
Glucose: 119 mg/dL — ABNORMAL HIGH (ref 70–99)
Potassium: 3.6 mmol/L (ref 3.5–5.2)
Sodium: 142 mmol/L (ref 134–144)
Total Protein: 6.3 g/dL (ref 6.0–8.5)
eGFR: 67 mL/min/{1.73_m2} (ref >59)

## 2022-01-14 LAB — TSH: TSH: 2.08 u[IU]/mL (ref 0.450–4.500)

## 2022-01-15 ENCOUNTER — Telehealth: Payer: Self-pay | Admitting: Family Medicine

## 2022-01-15 NOTE — Telephone Encounter (Signed)
Do not see where referral has been placed. Please review

## 2022-01-17 ENCOUNTER — Other Ambulatory Visit: Payer: Self-pay | Admitting: Family Medicine

## 2022-01-17 DIAGNOSIS — I700 Atherosclerosis of aorta: Secondary | ICD-10-CM

## 2022-01-17 DIAGNOSIS — E782 Mixed hyperlipidemia: Secondary | ICD-10-CM

## 2022-01-17 MED ORDER — REPATHA SURECLICK 140 MG/ML ~~LOC~~ SOAJ: 140.0000 mg | SUBCUTANEOUS | 2 refills | Status: DC

## 2022-01-18 ENCOUNTER — Encounter: Payer: Self-pay | Admitting: Family Medicine

## 2022-01-18 ENCOUNTER — Telehealth: Payer: Self-pay

## 2022-01-18 DIAGNOSIS — R7303 Prediabetes: Secondary | ICD-10-CM

## 2022-01-18 HISTORY — DX: Prediabetes: R73.03

## 2022-01-18 LAB — SPECIMEN STATUS REPORT

## 2022-01-18 LAB — HGB A1C W/O EAG: Hgb A1c MFr Bld: 6.1 % — ABNORMAL HIGH (ref 4.8–5.6)

## 2022-01-18 NOTE — Telephone Encounter (Signed)
Repatha SureClick 140MG /ML auto-injectors  (Key: [id number]) Rx #: [id number]  Sent to plan

## 2022-01-18 NOTE — Telephone Encounter (Signed)
Her referral has been placed. Please allow more time. Let us know in another week if she has not heard anything from them.

## 2022-01-18 NOTE — Telephone Encounter (Signed)
Pt aware of provider feedback and voiced understanding. 

## 2022-01-19 ENCOUNTER — Other Ambulatory Visit: Payer: Self-pay | Admitting: Family Medicine

## 2022-01-19 ENCOUNTER — Encounter: Payer: Self-pay | Admitting: Family Medicine

## 2022-01-19 ENCOUNTER — Telehealth: Payer: Self-pay | Admitting: Family Medicine

## 2022-01-19 DIAGNOSIS — E782 Mixed hyperlipidemia: Secondary | ICD-10-CM

## 2022-01-19 DIAGNOSIS — R002 Palpitations: Secondary | ICD-10-CM

## 2022-01-19 NOTE — Telephone Encounter (Signed)
Patient aware and verbalized understanding. Will place referral. Now patient is asking for a referral for cardiologist states she has already spoken to you about this

## 2022-01-19 NOTE — Telephone Encounter (Signed)
PA Case: [id number], Status: Approved, Coverage Starts on: 01/18/2022 12:00:00 AM, Coverage Ends on: 07/17/2022 12:00:00 AM. Questions? Contact [phone].   Pharmacy and patient aware.

## 2022-01-19 NOTE — Telephone Encounter (Signed)
Patient aware and verbalized understanding. °

## 2022-01-19 NOTE — Telephone Encounter (Signed)
Yes

## 2022-01-19 NOTE — Telephone Encounter (Signed)
Referral placed.

## 2022-01-19 NOTE — Telephone Encounter (Signed)
Patient wants to verify if she needs to continue the zetia with the repatha?

## 2022-01-19 NOTE — Telephone Encounter (Signed)
If she is agreeable in seeing Almyra Free, our clinical pharmacist, she would be able to possibly help with the cost with a grant she can help patient apply for. She will need a referral to CCM since she has Medicare.

## 2022-01-21 ENCOUNTER — Telehealth: Payer: Self-pay | Admitting: Family Medicine

## 2022-01-21 LAB — TOXASSURE SELECT 13 (MW), URINE

## 2022-01-21 NOTE — Telephone Encounter (Signed)
Pt called stating that the office we referred her to for ENT called and scheduled her appt but says they cant see her until May.   Wants to know if PCP can send her anywhere else that can see her much sooner?

## 2022-01-26 ENCOUNTER — Telehealth: Payer: Self-pay

## 2022-01-26 NOTE — Chronic Care Management (AMB) (Signed)
Chronic Care Management   Note  01/26/2022 Name: Sherry Salinas MRN: [id number] DOB: 1956-04-06  Sherry Salinas is a 66 y.o. year old female who is a primary care patient of Loman Brooklyn, FNP. I reached out to Sherry Salinas by phone today in response to a referral sent by Ms. Altamese Cabal Rubalcava's PCP.  Ms. Haskins was given information about Chronic Care Management services today including:  CCM service includes personalized support from designated clinical staff supervised by her physician, including individualized plan of care and coordination with other care providers 24/7 contact phone numbers for assistance for urgent and routine care needs. Service will only be billed when office clinical staff spend 20 minutes or more in a month to coordinate care. Only one practitioner may furnish and bill the service in a calendar month. The patient may stop CCM services at any time (effective at the end of the month) by phone call to the office staff. The patient is responsible for co-pay (up to 20% after annual deductible is met) if co-pay is required by the individual health plan.   Patient agreed to services and verbal consent obtained.   Follow up plan: Face to Face appointment with care management team member scheduled for: 02/25/2022  Noreene Larsson, [place], [place], [place] [address] Direct Dial: [phone] [email or web address]

## 2022-01-27 ENCOUNTER — Other Ambulatory Visit: Payer: Self-pay | Admitting: Nurse Practitioner

## 2022-01-27 DIAGNOSIS — J018 Other acute sinusitis: Secondary | ICD-10-CM

## 2022-01-27 NOTE — Telephone Encounter (Signed)
Last office visit 01/13/22 Last refill 12/30/21, #30, no refills

## 2022-02-04 ENCOUNTER — Ambulatory Visit: Payer: Medicare HMO

## 2022-02-11 ENCOUNTER — Other Ambulatory Visit: Payer: Self-pay | Admitting: Family Medicine

## 2022-02-11 DIAGNOSIS — K219 Gastro-esophageal reflux disease without esophagitis: Secondary | ICD-10-CM

## 2022-02-11 DIAGNOSIS — K589 Irritable bowel syndrome without diarrhea: Secondary | ICD-10-CM

## 2022-02-11 DIAGNOSIS — I10 Essential (primary) hypertension: Secondary | ICD-10-CM

## 2022-02-25 ENCOUNTER — Ambulatory Visit: Payer: Medicare HMO

## 2022-03-24 ENCOUNTER — Ambulatory Visit: Payer: Medicare HMO | Admitting: Cardiology

## 2022-03-26 ENCOUNTER — Encounter: Payer: Self-pay | Admitting: Family

## 2022-03-26 ENCOUNTER — Telehealth: Payer: Self-pay | Admitting: Family Medicine

## 2022-03-26 ENCOUNTER — Ambulatory Visit (INDEPENDENT_AMBULATORY_CARE_PROVIDER_SITE_OTHER): Payer: Medicare HMO | Admitting: Family

## 2022-03-26 DIAGNOSIS — H109 Unspecified conjunctivitis: Secondary | ICD-10-CM | POA: Diagnosis not present

## 2022-03-26 MED ORDER — POLYMYXIN B-TRIMETHOPRIM 10000-0.1 UNIT/ML-% OP SOLN: 1.0000 [drp] | Freq: Four times a day (QID) | OPHTHALMIC | 0 refills | Status: DC

## 2022-03-26 NOTE — Telephone Encounter (Signed)
Patient brought granddaughter Islah Eve) in on 4/19 to see Britney with pink eye. ?Now she has it as of today:  eye itching, painful and red. ? ?Can Britney call her in the same medicine to [facility] or does she need to be seen. ? ?Please call. ?

## 2022-03-26 NOTE — Progress Notes (Signed)
? ?Virtual Visit  Note ?Due to COVID-19 pandemic this visit was conducted virtually. This visit type was conducted due to national recommendations for restrictions regarding the COVID-19 Pandemic (e.g. social distancing, sheltering in place) in an effort to limit this patient's exposure and mitigate transmission in our community. All issues noted in this document were discussed and addressed.  A physical exam was not performed with this format. ? ?I connected with Sherry Salinas on 03/26/22 at 2:50 pm  by telephone and verified that I am speaking with the correct person using two identifiers. Sherry Salinas is currently located at home  and no one is currently with her during visit. The provider, Evelina Dun, FNP is located in their office at time of visit. ? ?I discussed the limitations, risks, security and privacy concerns of performing an evaluation and management service by telephone and the availability of in person appointments. I also discussed with the patient that there may be a patient responsible charge related to this service. The patient expressed understanding and agreed to proceed. ? ?[name],you are scheduled for a virtual visit with your provider today.   ? ?Just as we do with appointments in the office, we must obtain your consent to participate.  Your consent will be active for this visit and any virtual visit you may have with one of our providers in the next 365 days.   ? ?If you have a MyChart account, I can also send a copy of this consent to you electronically.  All virtual visits are billed to your insurance company just like a traditional visit in the office.  As this is a virtual visit, video technology does not allow for your provider to perform a traditional examination.  This may limit your provider's ability to fully assess your condition.  If your provider identifies any concerns that need to be evaluated in person or the need to arrange testing such as labs, EKG, etc, we will  make arrangements to do so.   ? ?Although advances in technology are sophisticated, we cannot ensure that it will always work on either your end or our end.  If the connection with a video visit is poor, we may have to switch to a telephone visit.  With either a video or telephone visit, we are not always able to ensure that we have a secure connection.   I need to obtain your verbal consent now.   Are you willing to proceed with your visit today?  ? ?Sherry Cabal Huckins has provided verbal consent on 03/26/2022 for a virtual visit (video or telephone). ? ? ?Evelina Dun, FNP ?03/26/2022  2:51 PM ? ? ?History and Present Illness: ? ?Conjunctivitis  ?The current episode started today. The onset was sudden. The problem occurs continuously. The problem has been rapidly worsening. The symptoms are relieved by rest. Associated symptoms include eye itching, photophobia, eye pain and eye redness. Pertinent negatives include no headaches, no hearing loss, no sore throat, no stridor and no eye discharge. Both eyes are affected.  ? ? ? ?Review of Systems  ?HENT:  Negative for hearing loss and sore throat.   ?Eyes:  Positive for photophobia, pain, redness and itching. Negative for discharge.  ?Respiratory:  Negative for stridor.   ?Neurological:  Negative for headaches.  ? ? ?Observations/Objective: ?No SOB or distress noted  ? ?Assessment and Plan: ?1. Bacterial conjunctivitis ?Good hand hygiene  ?Avoid rubbing eye ?Follow up if symptoms worsen or do not improve  ?-  trimethoprim-polymyxin b (POLYTRIM) ophthalmic solution; Place 1 drop into the left eye every 6 (six) hours.  Dispense: 10 mL; Refill: 0 ? ? ? ? ?  ?I discussed the assessment and treatment plan with the patient. The patient was provided an opportunity to ask questions and all were answered. The patient agreed with the plan and demonstrated an understanding of the instructions. ?  ?The patient was advised to call back or seek an in-person evaluation if the symptoms  worsen or if the condition fails to improve as anticipated. ? ?The above assessment and management plan was discussed with the patient. The patient verbalized understanding of and has agreed to the management plan. Patient is aware to call the clinic if symptoms persist or worsen. Patient is aware when to return to the clinic for a follow-up visit. Patient educated on when it is appropriate to go to the emergency department.  ? ?Time call ended:  3:01 pm  ? ?I provided 11 minutes of  non face-to-face time during this encounter. ? ? ? ?Evelina Dun, FNP ? ? ?

## 2022-03-26 NOTE — Telephone Encounter (Signed)
Called pt - appt made for TELE  ?

## 2022-03-26 NOTE — Patient Instructions (Signed)
Bacterial Conjunctivitis, Adult ?Bacterial conjunctivitis is an infection of the clear membrane that covers the white part of the eye and the inner surface of the eyelid (conjunctiva). When the blood vessels in the conjunctiva become inflamed, the eye becomes red or pink. The eye often feels irritated or itchy. Bacterial conjunctivitis spreads easily from person to person (is contagious). It also spreads easily from one eye to the other eye. ?What are the causes? ?This condition is caused by bacteria. You may get the infection if you come into close contact with: ?A person who is infected with the bacteria. ?Items that are contaminated with the bacteria, such as a face towel, contact lens solution, or eye makeup. ?What increases the risk? ?You are more likely to develop this condition if: ?You are exposed to other people who have the infection. ?You wear contact lenses. ?You have a sinus infection. ?You have had a recent eye injury or surgery. ?You have a weak body defense system (immune system). ?You have a medical condition that causes dry eyes. ?What are the signs or symptoms? ?Symptoms of this condition include: ?Thick, yellowish discharge from the eye. This may turn into a crust on the eyelid overnight and cause your eyelids to stick together. ?Tearing or watery eyes. ?Itchy eyes. ?Burning feeling in your eyes. ?Eye redness. ?Swollen eyelids. ?Blurred vision. ?How is this diagnosed? ?This condition is diagnosed based on your symptoms and medical history. Your health care provider may also take a sample of discharge from your eye to find the cause of your infection. ?How is this treated? ?This condition may be treated with: ?Antibiotic eye drops or ointment to clear the infection more quickly and prevent the spread of infection to others. ?Antibiotic medicines taken by mouth (orally) to treat infections that do not respond to drops or ointments or that last longer than 10 days. ?Cool, wet cloths (cool  compresses) placed on the eyes. ?Artificial tears applied 2-6 times a day. ?Follow these instructions at home: ?Medicines ?Take or apply your antibiotic medicine as told by your health care provider. Do not stop using the antibiotic, even if your condition improves, unless directed by your health care provider. ?Take or apply over-the-counter and prescription medicines only as told by your health care provider. ?Be very careful to avoid touching the edge of your eyelid with the eye-drop bottle or the ointment tube when you apply medicines to the affected eye. This will keep you from spreading the infection to your other eye or to other people. ?Managing discomfort ?Gently wipe away any drainage from your eye with a warm, wet washcloth or a cotton ball. ?Apply a clean, cool compress to your eye for 10-20 minutes, 3-4 times a day. ?General instructions ?Do not wear contact lenses until the inflammation is gone and your health care provider says it is safe to wear them again. Ask your health care provider how to sterilize or replace your contact lenses before you use them again. Wear glasses until you can resume wearing contact lenses. ?Avoid wearing eye makeup until the inflammation is gone. Throw away any old eye cosmetics that may be contaminated. ?Change or wash your pillowcase every day. ?Do not share towels or washcloths. This may spread the infection. ?Wash your hands often with soap and water for at least 20 seconds and especially before touching your face or eyes. Use paper towels to dry your hands. ?Avoid touching or rubbing your eyes. ?Do not drive or use heavy machinery if your vision is blurred. ?Contact   a health care provider if: ?You have a fever. ?Your symptoms do not get better after 10 days. ?Get help right away if: ?You have a fever and your symptoms suddenly get worse. ?You have severe pain when you move your eye. ?You have facial pain, redness, or swelling. ?You have a sudden loss of  vision. ?Summary ?Bacterial conjunctivitis is an infection of the clear membrane that covers the white part of the eye and the inner surface of the eyelid (conjunctiva). ?Bacterial conjunctivitis spreads easily from eye to eye and from person to person (is contagious). ?Wash your hands often with soap and water for at least 20 seconds and especially before touching your face or eyes. Use paper towels to dry your hands. ?Take or apply your antibiotic medicine as told by your health care provider. Do not stop using the antibiotic even if your condition improves. ?Contact a health care provider if you have a fever or if your symptoms do not get better after 10 days. Get help right away if you have a sudden loss of vision. ?This information is not intended to replace advice given to you by your health care provider. Make sure you discuss any questions you have with your health care provider. ?Document Revised: 03/04/2021 Document Reviewed: 03/04/2021 ?Elsevier Patient Education ? 2023 [employer] ? ?

## 2022-04-14 ENCOUNTER — Telehealth: Payer: Self-pay | Admitting: Pharmacist

## 2022-04-14 NOTE — Telephone Encounter (Signed)
Offered free grant for [employer] patient assistance and patient has declined appt x2.  We have also reached out to reschedule appt and patient declined. ? ?Please let me know if I can be of assistance in the future! ? ?Routing to PCP as an FYI ? ? ? ? ? ?

## 2022-06-11 DIAGNOSIS — Z0100 Encounter for examination of eyes and vision without abnormal findings: Secondary | ICD-10-CM | POA: Diagnosis not present

## 2022-06-11 DIAGNOSIS — H5200 Hypermetropia, unspecified eye: Secondary | ICD-10-CM | POA: Diagnosis not present

## 2022-06-11 DIAGNOSIS — E7800 Pure hypercholesterolemia, unspecified: Secondary | ICD-10-CM | POA: Diagnosis not present

## 2022-06-18 ENCOUNTER — Telehealth: Payer: Self-pay

## 2022-06-18 NOTE — Telephone Encounter (Signed)
Sherry Salinas (Key: [id number]) Repatha SureClick '140MG'$ /ML auto-injectors   Form [employer] Electronic PA Form Created 10 hours ago Sent to Plan 1 hour ago Plan Response 1 hour ago Submit Clinical Questions 1 hour ago Determination Favorable 1 hour ago Message from Plan PA Case: [id number], Status: Approved, Coverage Starts on: 12/06/2021 12:00:00 AM, Coverage Ends on: 12/05/2022 12:00:00 AM. Questions? Contact [phone].  Pharmacy informed

## 2022-06-18 NOTE — Telephone Encounter (Signed)
Windy Carina (Key: [id number]) Repatha SureClick '140MG'$ /ML auto-injectors   Form [employer] Electronic PA Form Created 9 hours ago Sent to Plan 5 minutes ago Plan Response 5 minutes ago Submit Clinical Questions less than a minute ago Determination Wait for Determination Please wait for [facility] NCPDP 2017 to return a determination.

## 2022-07-06 ENCOUNTER — Encounter: Payer: Self-pay | Admitting: Family Medicine

## 2022-07-06 ENCOUNTER — Ambulatory Visit (INDEPENDENT_AMBULATORY_CARE_PROVIDER_SITE_OTHER): Payer: Medicare HMO | Admitting: Family Medicine

## 2022-07-06 DIAGNOSIS — R3989 Other symptoms and signs involving the genitourinary system: Secondary | ICD-10-CM | POA: Diagnosis not present

## 2022-07-06 MED ORDER — AMOXICILLIN-POT CLAVULANATE 875-125 MG PO TABS: 1.0000 | ORAL_TABLET | Freq: Two times a day (BID) | ORAL | 0 refills | Status: AC

## 2022-07-06 NOTE — Progress Notes (Signed)
Virtual Visit via Telephone Note  I connected with Sherry Salinas on 07/06/22 at 11:18 AM by telephone and verified that I am speaking with the correct person using two identifiers. Sherry Salinas is currently located at home and nobody is currently with her during this visit. The provider, Loman Brooklyn, FNP is located in their office at time of visit.  I discussed the limitations, risks, security and privacy concerns of performing an evaluation and management service by telephone and the availability of in person appointments. I also discussed with the patient that there may be a patient responsible charge related to this service. The patient expressed understanding and agreed to proceed.  Subjective: PCP: Loman Brooklyn, FNP  Chief Complaint  Patient presents with   Urinary Tract Infection   Patient reports she has a urinary tract infection.  Symptoms started yesterday and include frequency, urgency, weak stream, straining to urinate, and feeling like she cannot empty her bladder all the way.  States she had a temperature of 99.2 last night, but was 97.6 this morning.  She believes this flared up due to her colon symptoms.  She has been experiencing some lower abdominal pain and constipation.  She does have diverticulosis and IBS with constipation.   ROS: Per HPI  Current Outpatient Medications:    amLODipine (NORVASC) 5 MG tablet, Take 1 tablet (5 mg total) by mouth daily., Disp: 90 tablet, Rfl: 3   busPIRone (BUSPAR) 7.5 MG tablet, Take 1 tablet (7.5 mg total) by mouth 3 (three) times daily., Disp: 270 tablet, Rfl: 3   Calcium-Magnesium-Zinc (CAL-MAG-ZINC PO), Take 1 tablet by mouth daily., Disp: , Rfl:    celecoxib (CELEBREX) 100 MG capsule, Take 1 capsule (100 mg total) by mouth 2 (two) times daily., Disp: 180 capsule, Rfl: 3   Dextromethorphan-guaiFENesin ([employer] MUCUS DM EXTENDED RELEASE) 30-600 MG TB12, TAKE 1 TABLET BY MOUTH TWICE A DAY, Disp: 30 tablet, Rfl: 0   dicyclomine  (BENTYL) 20 MG tablet, Take 1 tablet (20 mg total) by mouth 3 (three) times daily., Disp: 270 tablet, Rfl: 3   EPINEPHrine 0.3 mg/0.3 mL IJ SOAJ injection, Inject 0.3 mLs (0.3 mg total) into the muscle as needed for anaphylaxis., Disp: 1 each, Rfl: 3   Evolocumab (REPATHA SURECLICK) 854 MG/ML SOAJ, Inject 140 mg into the skin every 14 (fourteen) days., Disp: 2 mL, Rfl: 2   ezetimibe (ZETIA) 10 MG tablet, Take 1 tablet (10 mg total) by mouth daily., Disp: 90 tablet, Rfl: 3   hydrochlorothiazide (HYDRODIURIL) 25 MG tablet, Take 1 tablet (25 mg total) by mouth daily., Disp: 90 tablet, Rfl: 3   levocetirizine (XYZAL) 5 MG tablet, Take 1 tablet (5 mg total) by mouth every evening., Disp: 90 tablet, Rfl: 3   methocarbamol (ROBAXIN) 500 MG tablet, Take 1 tablet (500 mg total) by mouth every 8 (eight) hours as needed for muscle spasms., Disp: 60 tablet, Rfl: 5   metoprolol succinate (TOPROL-XL) 50 MG 24 hr tablet, Take 1 tablet (50 mg total) by mouth daily. Take with or immediately following a meal., Disp: 90 tablet, Rfl: 3   ondansetron (ZOFRAN) 4 MG tablet, Take 1 tablet (4 mg total) by mouth every 8 (eight) hours as needed for nausea or vomiting., Disp: 30 tablet, Rfl: 1   pantoprazole (PROTONIX) 40 MG tablet, Take 1 tablet (40 mg total) by mouth daily. 30 minutes before breakfast daily, Disp: 90 tablet, Rfl: 3   polyethylene glycol-electrolytes (NULYTELY) 420 g solution, As directed, Disp:  4000 mL, Rfl: 0   traMADol (ULTRAM) 50 MG tablet, Take 1 tablet (50 mg total) by mouth 2 (two) times daily as needed., Disp: 60 tablet, Rfl: 5   triamcinolone cream (KENALOG) 0.1 %, Apply 1 application topically 2 (two) times daily as needed., Disp: 45 g, Rfl: 2   trimethoprim-polymyxin b (POLYTRIM) ophthalmic solution, Place 1 drop into the left eye every 6 (six) hours., Disp: 10 mL, Rfl: 0  Allergies  Allergen Reactions   Bee Venom Anaphylaxis   Codeine Anaphylaxis   Statins Other (See Comments)    Muscle aches    Sulfa Antibiotics Rash   Sulfasalazine Rash   Past Medical History:  Diagnosis Date   Anxiety    Aortic atherosclerosis (HCC)    Arthritis    Hyperlipidemia    Hypertension    IBS (irritable bowel syndrome)    Osteopenia 01/08/2021   Prediabetes 01/18/2022   Vitamin D deficiency 09/14/2017    Observations/Objective: A&O  No respiratory distress or wheezing audible over the phone Mood, judgement, and thought processes all WNL   Assessment and Plan: 1. Suspected UTI Unable to come leave a urine sample.  Treating empirically given her symptoms with Augmentin since she feels this is due to diverticulitis. - amoxicillin-clavulanate (AUGMENTIN) 875-125 MG tablet; Take 1 tablet by mouth 2 (two) times daily for 7 days.  Dispense: 14 tablet; Refill: 0   Follow Up Instructions:  I discussed the assessment and treatment plan with the patient. The patient was provided an opportunity to ask questions and all were answered. The patient agreed with the plan and demonstrated an understanding of the instructions.   The patient was advised to call back or seek an in-person evaluation if the symptoms worsen or if the condition fails to improve as anticipated.  The above assessment and management plan was discussed with the patient. The patient verbalized understanding of and has agreed to the management plan. Patient is aware to call the clinic if symptoms persist or worsen. Patient is aware when to return to the clinic for a follow-up visit. Patient educated on when it is appropriate to go to the emergency department.   Time call ended: 11:29 AM  I provided 11 minutes of non-face-to-face time during this encounter.  Hendricks Limes, MSN, APRN, FNP-C [facility] 07/06/22

## 2022-07-12 ENCOUNTER — Telehealth: Payer: Self-pay | Admitting: Family Medicine

## 2022-07-12 NOTE — Telephone Encounter (Signed)
PT AWARE TO FAST FOR APPOINTMENT

## 2022-07-13 ENCOUNTER — Ambulatory Visit (INDEPENDENT_AMBULATORY_CARE_PROVIDER_SITE_OTHER): Payer: Medicare HMO | Admitting: Family Medicine

## 2022-07-13 ENCOUNTER — Encounter: Payer: Self-pay | Admitting: Family Medicine

## 2022-07-13 VITALS — BP 120/74 | HR 63 | Temp 98.1°F | Resp 20 | Ht 63.0 in | Wt 142.0 lb

## 2022-07-13 DIAGNOSIS — I700 Atherosclerosis of aorta: Secondary | ICD-10-CM

## 2022-07-13 DIAGNOSIS — E782 Mixed hyperlipidemia: Secondary | ICD-10-CM | POA: Diagnosis not present

## 2022-07-13 DIAGNOSIS — M25551 Pain in right hip: Secondary | ICD-10-CM | POA: Diagnosis not present

## 2022-07-13 DIAGNOSIS — R7303 Prediabetes: Secondary | ICD-10-CM

## 2022-07-13 DIAGNOSIS — Z79899 Other long term (current) drug therapy: Secondary | ICD-10-CM

## 2022-07-13 DIAGNOSIS — K219 Gastro-esophageal reflux disease without esophagitis: Secondary | ICD-10-CM

## 2022-07-13 DIAGNOSIS — M25561 Pain in right knee: Secondary | ICD-10-CM | POA: Diagnosis not present

## 2022-07-13 DIAGNOSIS — M25512 Pain in left shoulder: Secondary | ICD-10-CM

## 2022-07-13 DIAGNOSIS — R002 Palpitations: Secondary | ICD-10-CM

## 2022-07-13 DIAGNOSIS — I10 Essential (primary) hypertension: Secondary | ICD-10-CM | POA: Diagnosis not present

## 2022-07-13 DIAGNOSIS — B379 Candidiasis, unspecified: Secondary | ICD-10-CM

## 2022-07-13 DIAGNOSIS — G8929 Other chronic pain: Secondary | ICD-10-CM

## 2022-07-13 DIAGNOSIS — F411 Generalized anxiety disorder: Secondary | ICD-10-CM | POA: Diagnosis not present

## 2022-07-13 DIAGNOSIS — Z91030 Bee allergy status: Secondary | ICD-10-CM

## 2022-07-13 DIAGNOSIS — K589 Irritable bowel syndrome without diarrhea: Secondary | ICD-10-CM

## 2022-07-13 DIAGNOSIS — T3695XA Adverse effect of unspecified systemic antibiotic, initial encounter: Secondary | ICD-10-CM

## 2022-07-13 DIAGNOSIS — M8589 Other specified disorders of bone density and structure, multiple sites: Secondary | ICD-10-CM

## 2022-07-13 LAB — BAYER DCA HB A1C WAIVED: HB A1C (BAYER DCA - WAIVED): 5.1 % (ref 4.8–5.6)

## 2022-07-13 MED ORDER — EPINEPHRINE 0.3 MG/0.3ML IJ SOAJ: 0.3000 mg | INTRAMUSCULAR | 3 refills | Status: DC | PRN

## 2022-07-13 MED ORDER — DESVENLAFAXINE SUCCINATE ER 25 MG PO TB24: 25.0000 mg | ORAL_TABLET | Freq: Every day | ORAL | 2 refills | Status: DC

## 2022-07-13 MED ORDER — FLUCONAZOLE 150 MG PO TABS: 150.0000 mg | ORAL_TABLET | Freq: Once | ORAL | 0 refills | Status: AC

## 2022-07-13 MED ORDER — TRAMADOL HCL 50 MG PO TABS: 50.0000 mg | ORAL_TABLET | Freq: Two times a day (BID) | ORAL | 5 refills | Status: DC | PRN

## 2022-07-13 NOTE — Patient Instructions (Addendum)
BP goal <150/90.  Calcium citrate instead of calcium carbonate.

## 2022-07-13 NOTE — Progress Notes (Signed)
Assessment & Plan:  1. Benign essential hypertension Well controlled on current regimen.  Discussed blood pressure goal less than 150/90.  Encourage patient to continue monitoring at home. - CMP14+EGFR - CBC with Differential/Platelet  2. GAD (generalized anxiety disorder) Uncontrolled.  Starting Pristiq 25 mg once daily.  May continue BuSpar for now.  Discussed we can try weaning once Pristiq is built up in her system and working for her. - CMP14+EGFR - desvenlafaxine (PRISTIQ) 25 MG 24 hr tablet; Take 1 tablet (25 mg total) by mouth daily.  Dispense: 30 tablet; Refill: 2  3. Mixed hyperlipidemia Uncontrolled.  Reassessing today. - CMP14+EGFR - CBC with Differential/Platelet - Lipid panel  4. Aortic atherosclerosis (HCC) Continue current medications.  - Lipid panel - aspirin EC 81 MG tablet; Take 81 mg by mouth daily. Swallow whole.  5. Prediabetes Resolved with A1c of 5.1. - Bayer DCA Hb A1c Waived  6-9. Chronic right hip pain/Chronic left shoulder pain/Chronic pain of right knee/Controlled substance agreement signed Well controlled on current regimen. Controlled substance agreement in place. Urine drug screen as expected. PDMP reviewed with no concerning findings.  - CMP14+EGFR - traMADol (ULTRAM) 50 MG tablet; Take 1 tablet (50 mg total) by mouth 2 (two) times daily as needed.  Dispense: 60 tablet; Refill: 5  10. Irritable bowel syndrome without diarrhea Well controlled on current regimen.  - CMP14+EGFR  11. Gastroesophageal reflux disease, unspecified whether esophagitis present Well controlled on current regimen.  - CMP14+EGFR - CBC with Differential/Platelet  12. Osteopenia of multiple sites Patient to switch to calcium citrate with vitamin D.  13. Bee sting allergy - EPINEPHrine 0.3 mg/0.3 mL IJ SOAJ injection; Inject 0.3 mg into the muscle as needed for anaphylaxis.  Dispense: 1 each; Refill: 3  14. Antibiotic-induced yeast infection - fluconazole  (DIFLUCAN) 150 MG tablet; Take 1 tablet (150 mg total) by mouth once for 1 dose. May repeat after 3 days if needed.  Dispense: 2 tablet; Refill: 0  15. Palpitations - Ambulatory referral to Cardiology   Return in about 6 weeks (around 08/24/2022) for anxiety with Rakes.  Hendricks Limes, MSN, APRN, FNP-C [facility]  Subjective:    Patient ID: Sherry Salinas, female    DOB: 03/12/1956, 66 y.o.   MRN: [id number]  Patient Care Team: Loman Brooklyn, FNP as PCP - General (Family Medicine) Gala Romney Cristopher Estimable, MD as Consulting Physician (Gastroenterology) Lavera Guise, [facility] as [place] Management (Pharmacist)   Chief Complaint:  Chief Complaint  Patient presents with   Medical Management of Chronic Issues    6 mo     HPI: JAZALYNN MIRELES is a 66 y.o. female presenting on 07/13/2022 for Medical Management of Chronic Issues (6 mo )  Hyperlipidemia/Aortic Atherosclerosis: Patient is taking Zetia and tolerating it well.  She is intolerant to statins. She was unable to afford Reptha or Nexlizet and declined a referral to our clinical pharmacist for prescription cost assistance. Her 10-year ASCVD risk score is: 16%. She declines a referral to the lipid clinic.  Hypertension: Patient she is sometimes checking her blood pressure at home. Highest systolic of 299. She is getting diastolic readings in the 37J.   IBS: Taking Bentyl 20 mg 3 times daily.  GERD: Taking pantoprazole daily.  Osteopenia: Taking calcium and vitamin D supplement when she feels like it. States she doesn't like to take it because it causes constipation.  Most recent DEXA scan completed 01/07/2021.   Anxiety: Taking BuSpar  7.5 mg twice daily. She does not feel well controlled.     07/13/2022   10:13 AM 01/13/2022    1:57 PM 07/15/2021    8:38 AM 06/04/2021    4:42 PM  GAD 7 : Generalized Anxiety Score  Nervous, Anxious, on Edge _0 Control/stop worrying _1 Worry too much  - different things _2 Trouble relaxing _3 Restless _4 Easily annoyed or irritable _5 Afraid - awful might happen 0 0 1 3  Total GAD 7 Score _6 Anxiety Difficulty Somewhat difficult Somewhat difficult Somewhat difficult Very difficult      07/13/2022   10:12 AM 01/13/2022    1:56 PM 07/15/2021    8:38 AM  Depression screen PHQ 2/9  Decreased Interest _7 Down, Depressed, Hopeless _8 PHQ - 2 Score _9 Altered sleeping _10 Tired, decreased energy _11 Change in appetite 0 0 0  Feeling bad or failure about yourself  0 0 0  Trouble concentrating _12 Moving slowly or fidgety/restless 0 0 0  Suicidal thoughts 0 0 0  PHQ-9 Score _13 Difficult doing work/chores Not difficult at all Somewhat difficult Somewhat difficult    Pain assessment: Cause of pain- arthritis, right knee injury with surgery, left shoulder injury falling off horse, right hip Pain location- right knee, left shoulder, hands/joints Pain on scale of 1-10- 6-7/10 at night when trying to get comfortable Frequency- daily What increases pain- lying down What makes pain better- using it, Tramadol, and Celebrex. Celebrex works, but causes diarrhea when she takes it twice daily every day, so she is not using it regularly. Effects on ADL - unable to do things she use too Any change in general medical condition- No  Current opioids rx- Tramadol 50 mg BID PRN # meds rx- 60 Effectiveness of current meds- effective Adverse reactions form pain meds- none Morphine equivalent- 10 MME/day  Pill count performed-No Last drug screen - 01/13/2022 ( high risk q54m moderate risk q632mlow risk yearly ) Urine drug screen today- No Was the [place]- Yes  If yes were their any concerning findings? - No  Overdose risk: 050     04/07/2020    1:07 PM  Opioid Risk   Alcohol 0  Illegal Drugs 0  Rx Drugs 0  Alcohol 0  Illegal Drugs 0  Rx Drugs 0  Age between 16-45 years  1   History of Preadolescent Sexual Abuse 0  Psychological Disease 0  Depression 0  Opioid Risk Tool Scoring 1  Opioid Risk Interpretation Low Risk   Pain contract signed on: 01/13/2022  New complaints: Patient believes she has a yeast infection from antibiotics she was prescribed a week ago to treat a UTI. She reports itching and burning.   Patient reports she canceled previous appointments with ENT, cardiology, and our clinical pharmacist.  States it took them too long to get back to her and if she was going to die she would have already died.  The only one she wishes to reschedule is cardiology for palpitations and she does continue to experience this.   Social history:  Relevant past medical, surgical, family and social history reviewed and updated as indicated. Interim medical history since our last visit reviewed.  Allergies and  medications reviewed and updated.  DATA REVIEWED: CHART IN EPIC  ROS: Negative unless specifically indicated above in HPI.    Current Outpatient Medications:    amLODipine (NORVASC) 5 MG tablet, Take 1 tablet (5 mg total) by mouth daily., Disp: 90 tablet, Rfl: 3   amoxicillin-clavulanate (AUGMENTIN) 875-125 MG tablet, Take 1 tablet by mouth 2 (two) times daily for 7 days., Disp: 14 tablet, Rfl: 0   busPIRone (BUSPAR) 7.5 MG tablet, Take 1 tablet (7.5 mg total) by mouth 3 (three) times daily., Disp: 270 tablet, Rfl: 3   Calcium-Magnesium-Zinc (CAL-MAG-ZINC PO), Take 1 tablet by mouth daily., Disp: , Rfl:    celecoxib (CELEBREX) 100 MG capsule, Take 1 capsule (100 mg total) by mouth 2 (two) times daily., Disp: 180 capsule, Rfl: 3   Dextromethorphan-guaiFENesin ([employer] MUCUS DM EXTENDED RELEASE) 30-600 MG TB12, TAKE 1 TABLET BY MOUTH TWICE A DAY, Disp: 30 tablet, Rfl: 0   dicyclomine (BENTYL) 20 MG tablet, Take 1 tablet (20 mg total) by mouth 3 (three) times daily., Disp: 270 tablet, Rfl: 3   ezetimibe (ZETIA) 10 MG tablet, Take 1 tablet (10 mg total) by mouth  daily., Disp: 90 tablet, Rfl: 3   hydrochlorothiazide (HYDRODIURIL) 25 MG tablet, Take 1 tablet (25 mg total) by mouth daily., Disp: 90 tablet, Rfl: 3   levocetirizine (XYZAL) 5 MG tablet, Take 1 tablet (5 mg total) by mouth every evening., Disp: 90 tablet, Rfl: 3   methocarbamol (ROBAXIN) 500 MG tablet, Take 1 tablet (500 mg total) by mouth every 8 (eight) hours as needed for muscle spasms., Disp: 60 tablet, Rfl: 5   metoprolol succinate (TOPROL-XL) 50 MG 24 hr tablet, Take 1 tablet (50 mg total) by mouth daily. Take with or immediately following a meal., Disp: 90 tablet, Rfl: 3   pantoprazole (PROTONIX) 40 MG tablet, Take 1 tablet (40 mg total) by mouth daily. 30 minutes before breakfast daily, Disp: 90 tablet, Rfl: 3   traMADol (ULTRAM) 50 MG tablet, Take 1 tablet (50 mg total) by mouth 2 (two) times daily as needed., Disp: 60 tablet, Rfl: 5   triamcinolone cream (KENALOG) 0.1 %, Apply 1 application topically 2 (two) times daily as needed., Disp: 45 g, Rfl: 2   EPINEPHrine 0.3 mg/0.3 mL IJ SOAJ injection, Inject 0.3 mLs (0.3 mg total) into the muscle as needed for anaphylaxis. (Patient not taking: Reported on 07/13/2022), Disp: 1 each, Rfl: 3   ondansetron (ZOFRAN) 4 MG tablet, Take 1 tablet (4 mg total) by mouth every 8 (eight) hours as needed for nausea or vomiting. (Patient not taking: Reported on 07/13/2022), Disp: 30 tablet, Rfl: 1   Allergies  Allergen Reactions   Bee Venom Anaphylaxis   Codeine Anaphylaxis   Statins Other (See Comments)    Muscle aches   Sulfa Antibiotics Rash   Sulfasalazine Rash   Past Medical History:  Diagnosis Date   Anxiety    Aortic atherosclerosis ([place])    Arthritis    Hyperlipidemia    Hypertension    IBS (irritable bowel syndrome)    Osteopenia 01/08/2021   Prediabetes 01/18/2022   Vitamin D deficiency 09/14/2017    Past Surgical History:  Procedure Laterality Date   COLONOSCOPY  2015   Dr. Amedeo Plenty: per pt, diverticulosis, 3 polyps, come back in five  years.    COLONOSCOPY WITH ESOPHAGOGASTRODUODENOSCOPY (EGD)  02/2003   Dr. Amedeo Plenty: Grade 3 esophagitis, hiatal hernia with patulous GE junction, mild duodenitis.  CLOtest, results unavailable.  Colonoscopy normal.   COLONOSCOPY  WITH PROPOFOL N/A 06/23/2021   Procedure: COLONOSCOPY WITH PROPOFOL;  Surgeon: Eloise Harman, DO;  [facility];  Service: Endoscopy;  Laterality: N/A;  10:30am   POLYPECTOMY  06/23/2021   Procedure: POLYPECTOMY INTESTINAL;  Surgeon: Eloise Harman, DO;  Location: [facility];  Service: Endoscopy;;   REPLACEMENT TOTAL KNEE Right 2012   ROTATOR CUFF REPAIR Right     Social History   Socioeconomic History   Marital status: Married    Spouse name: Not on file   Number of children: 2   Years of education: Not on file   Highest education level: Associate degree: academic program  Occupational History   Occupation: Retired    Comment: Previously EMT, FF, and RN  Tobacco Use   Smoking status: Every Day    Packs/day: 1.00    Years: 30.00    Total pack years: 30.00    Types: Cigarettes   Smokeless tobacco: Never  Vaping Use   Vaping Use: Never used  Substance and Sexual Activity   Alcohol use: Never   Drug use: Never   Sexual activity: Not on file  Other Topics Concern   Not on file  Social History Narrative   Lives with husband, son and granddaughter    Social Determinants of Health   Financial Resource Strain: Not on file  Food Insecurity: Not on file  Transportation Needs: Not on file  Physical Activity: Not on file  Stress: Not on file  Social Connections: Not on file  Intimate Partner Violence: Not on file        Objective:    BP 120/74   Pulse 63   Temp 98.1 F (36.7 C)   Resp 20   Ht _0  (1.6 m)   Wt 142 lb (64.4 kg)   SpO2 97%   BMI 25.15 kg/m   Wt Readings from Last 3 Encounters:  07/13/22 142 lb (64.4 kg)  01/13/22 143 lb (64.9 kg)  07/15/21 147 lb (66.7 kg)   Physical Exam Vitals reviewed.   Constitutional:      General: She is not in acute distress.    Appearance: Normal appearance. She is overweight. She is not ill-appearing, toxic-appearing or diaphoretic.  HENT:     Head: Normocephalic and atraumatic.  Eyes:     General: No scleral icterus.       Right eye: No discharge.        Left eye: No discharge.     Conjunctiva/sclera: Conjunctivae normal.  Cardiovascular:     Rate and Rhythm: Normal rate and regular rhythm.     Heart sounds: Normal heart sounds. No murmur heard.    No friction rub. No gallop.  Pulmonary:     Effort: Pulmonary effort is normal. No respiratory distress.     Breath sounds: Normal breath sounds. No stridor. No wheezing, rhonchi or rales.  Musculoskeletal:        General: Normal range of motion.     Cervical back: Normal range of motion.  Skin:    General: Skin is warm and dry.     Capillary Refill: Capillary refill takes less than 2 seconds.  Neurological:     General: No focal deficit present.     Mental Status: She is alert and oriented to person, place, and time. Mental status is at baseline.  Psychiatric:        Mood and Affect: Mood normal.        Behavior: Behavior normal.  Thought Content: Thought content normal.        Judgment: Judgment normal.    Lab Results  Component Value Date   TSH 2.080 01/13/2022   Lab Results  Component Value Date   WBC 8.6 01/13/2022   HGB 14.0 01/13/2022   HCT 41.8 01/13/2022   MCV 96 01/13/2022   PLT 238 01/13/2022   Lab Results  Component Value Date   NA 142 01/13/2022   K 3.6 01/13/2022   CO2 22 01/13/2022   GLUCOSE 119 (H) 01/13/2022   BUN 14 01/13/2022   CREATININE 0.94 01/13/2022   BILITOT 0.2 01/13/2022   ALKPHOS 79 01/13/2022   AST 20 01/13/2022   ALT 25 01/13/2022   PROT 6.3 01/13/2022   ALBUMIN 4.2 01/13/2022   CALCIUM 9.8 01/13/2022   ANIONGAP 7 06/19/2021   EGFR 67 01/13/2022   Lab Results  Component Value Date   CHOL 214 (H) 01/13/2022   Lab Results   Component Value Date   HDL 39 (L) 01/13/2022   Lab Results  Component Value Date   LDLCALC 142 (H) 01/13/2022   Lab Results  Component Value Date   TRIG 184 (H) 01/13/2022   Lab Results  Component Value Date   CHOLHDL 5.5 (H) 01/13/2022   Lab Results  Component Value Date   HGBA1C 6.1 (H) 01/13/2022

## 2022-07-14 LAB — CMP14+EGFR
ALT: 22 IU/L (ref 0–32)
AST: 18 IU/L (ref 0–40)
Albumin/Globulin Ratio: 2.1 (ref 1.2–2.2)
Albumin: 4.4 g/dL (ref 3.9–4.9)
Alkaline Phosphatase: 75 IU/L (ref 44–121)
BUN/Creatinine Ratio: 16 (ref 12–28)
BUN: 13 mg/dL (ref 8–27)
Bilirubin Total: 0.4 mg/dL (ref 0.0–1.2)
CO2: 23 mmol/L (ref 20–29)
Calcium: 10 mg/dL (ref 8.7–10.3)
Chloride: 101 mmol/L (ref 96–106)
Creatinine, Ser: 0.83 mg/dL (ref 0.57–1.00)
Globulin, Total: 2.1 g/dL (ref 1.5–4.5)
Glucose: 93 mg/dL (ref 70–99)
Potassium: 4.7 mmol/L (ref 3.5–5.2)
Sodium: 142 mmol/L (ref 134–144)
Total Protein: 6.5 g/dL (ref 6.0–8.5)
eGFR: 77 mL/min/{1.73_m2} (ref >59)

## 2022-07-14 LAB — LIPID PANEL
Chol/HDL Ratio: 4.8 ratio — ABNORMAL HIGH (ref 0.0–4.4)
Cholesterol, Total: 201 mg/dL — ABNORMAL HIGH (ref 100–199)
HDL: 42 mg/dL (ref >39)
LDL Chol Calc (NIH): 132 mg/dL — ABNORMAL HIGH (ref 0–99)
Triglycerides: 152 mg/dL — ABNORMAL HIGH (ref 0–149)
VLDL Cholesterol Cal: 27 mg/dL (ref 5–40)

## 2022-07-14 LAB — CBC WITH DIFFERENTIAL/PLATELET
Basophils Absolute: 0 10*3/uL (ref 0.0–0.2)
Basos: 0 %
EOS (ABSOLUTE): 0.2 10*3/uL (ref 0.0–0.4)
Eos: 2 %
Hematocrit: 42.5 % (ref 34.0–46.6)
Hemoglobin: 14.4 g/dL (ref 11.1–15.9)
Immature Grans (Abs): 0 10*3/uL (ref 0.0–0.1)
Immature Granulocytes: 0 %
Lymphocytes Absolute: 2.9 10*3/uL (ref 0.7–3.1)
Lymphs: 28 %
MCH: 32.7 pg (ref 26.6–33.0)
MCHC: 33.9 g/dL (ref 31.5–35.7)
MCV: 97 fL (ref 79–97)
Monocytes Absolute: 0.9 10*3/uL (ref 0.1–0.9)
Monocytes: 9 %
Neutrophils Absolute: 6.4 10*3/uL (ref 1.4–7.0)
Neutrophils: 61 %
Platelets: 232 10*3/uL (ref 150–450)
RBC: 4.4 x10E6/uL (ref 3.77–5.28)
RDW: 11.8 % (ref 11.7–15.4)
WBC: 10.4 10*3/uL (ref 3.4–10.8)

## 2022-07-27 ENCOUNTER — Other Ambulatory Visit: Payer: Self-pay | Admitting: Family Medicine

## 2022-07-27 DIAGNOSIS — F411 Generalized anxiety disorder: Secondary | ICD-10-CM

## 2022-09-08 ENCOUNTER — Ambulatory Visit: Payer: Medicare HMO | Admitting: Cardiology

## 2022-09-24 ENCOUNTER — Ambulatory Visit: Payer: Medicare HMO | Admitting: Nurse Practitioner

## 2022-09-24 DIAGNOSIS — K5792 Diverticulitis of intestine, part unspecified, without perforation or abscess without bleeding: Secondary | ICD-10-CM | POA: Diagnosis not present

## 2022-09-24 DIAGNOSIS — R519 Headache, unspecified: Secondary | ICD-10-CM | POA: Diagnosis not present

## 2022-09-24 DIAGNOSIS — Z20822 Contact with and (suspected) exposure to covid-19: Secondary | ICD-10-CM | POA: Diagnosis not present

## 2022-09-24 DIAGNOSIS — R350 Frequency of micturition: Secondary | ICD-10-CM | POA: Diagnosis not present

## 2022-10-10 DIAGNOSIS — R7303 Prediabetes: Secondary | ICD-10-CM | POA: Insufficient documentation

## 2022-10-10 DIAGNOSIS — E118 Type 2 diabetes mellitus with unspecified complications: Secondary | ICD-10-CM | POA: Insufficient documentation

## 2022-10-10 NOTE — Progress Notes (Deleted)
Cardiology Office Note   Date:  10/10/2022   ID:  Sherry Salinas, DOB [birth year], MRN [id number]  PCP:  Chevis Pretty, FNP  Cardiologist:   None Referring:  ***  No chief complaint on file.     History of Present Illness: Sherry Salinas is a 66 y.o. female who presents for ***     Past Medical History:  Diagnosis Date   Anxiety    Aortic atherosclerosis ([place])    Arthritis    Hyperlipidemia    Hypertension    IBS (irritable bowel syndrome)    Osteopenia 01/08/2021   Prediabetes 01/18/2022   Vitamin D deficiency 09/14/2017    Past Surgical History:  Procedure Laterality Date   COLONOSCOPY  2015   Dr. Amedeo Plenty: per pt, diverticulosis, 3 polyps, come back in five years.    COLONOSCOPY WITH ESOPHAGOGASTRODUODENOSCOPY (EGD)  02/2003   Dr. Amedeo Plenty: Grade 3 esophagitis, hiatal hernia with patulous GE junction, mild duodenitis.  CLOtest, results unavailable.  Colonoscopy normal.   COLONOSCOPY WITH PROPOFOL N/A 06/23/2021   Procedure: COLONOSCOPY WITH PROPOFOL;  Surgeon: Eloise Harman, DO;  [facility];  Service: Endoscopy;  Laterality: N/A;  10:30am   POLYPECTOMY  06/23/2021   Procedure: POLYPECTOMY INTESTINAL;  Surgeon: Eloise Harman, DO;  [facility];  Service: Endoscopy;;   REPLACEMENT TOTAL KNEE Right 2012   ROTATOR CUFF REPAIR Right      Current Outpatient Medications  Medication Sig Dispense Refill   amLODipine (NORVASC) 5 MG tablet Take 1 tablet (5 mg total) by mouth daily. 90 tablet 3   aspirin EC 81 MG tablet Take 81 mg by mouth daily. Swallow whole.     busPIRone (BUSPAR) 7.5 MG tablet Take 1 tablet (7.5 mg total) by mouth 3 (three) times daily. 270 tablet 3   Calcium-Magnesium-Zinc (CAL-MAG-ZINC PO) Take 1 tablet by mouth daily.     celecoxib (CELEBREX) 100 MG capsule Take 1 capsule (100 mg total) by mouth 2 (two) times daily. 180 capsule 3   desvenlafaxine (PRISTIQ) 25 MG 24 hr tablet TAKE 1 TABLET (25 MG TOTAL) BY MOUTH  DAILY. 90 tablet 1   Dextromethorphan-guaiFENesin ([employer] MUCUS DM EXTENDED RELEASE) 30-600 MG TB12 TAKE 1 TABLET BY MOUTH TWICE A DAY 30 tablet 0   dicyclomine (BENTYL) 20 MG tablet Take 1 tablet (20 mg total) by mouth 3 (three) times daily. 270 tablet 3   EPINEPHrine 0.3 mg/0.3 mL IJ SOAJ injection Inject 0.3 mg into the muscle as needed for anaphylaxis. 1 each 3   ezetimibe (ZETIA) 10 MG tablet Take 1 tablet (10 mg total) by mouth daily. 90 tablet 3   hydrochlorothiazide (HYDRODIURIL) 25 MG tablet Take 1 tablet (25 mg total) by mouth daily. 90 tablet 3   levocetirizine (XYZAL) 5 MG tablet Take 1 tablet (5 mg total) by mouth every evening. 90 tablet 3   methocarbamol (ROBAXIN) 500 MG tablet Take 1 tablet (500 mg total) by mouth every 8 (eight) hours as needed for muscle spasms. 60 tablet 5   metoprolol succinate (TOPROL-XL) 50 MG 24 hr tablet Take 1 tablet (50 mg total) by mouth daily. Take with or immediately following a meal. 90 tablet 3   pantoprazole (PROTONIX) 40 MG tablet Take 1 tablet (40 mg total) by mouth daily. 30 minutes before breakfast daily 90 tablet 3   traMADol (ULTRAM) 50 MG tablet Take 1 tablet (50 mg total) by mouth 2 (two) times daily as needed. 60 tablet 5  triamcinolone cream (KENALOG) 0.1 % Apply 1 application topically 2 (two) times daily as needed. 45 g 2   No current facility-administered medications for this visit.    Allergies:   Bee venom, Codeine, Statins, Sulfa antibiotics, and Sulfasalazine    Social History:  The patient  reports that she has been smoking cigarettes. She has a 30.00 pack-year smoking history. She has never used smokeless tobacco. She reports that she does not drink alcohol and does not use drugs.   Family History:  The patient's ***family history includes Arthritis in her mother; Colon cancer in her paternal uncle; Early death in her paternal grandfather and paternal grandmother; Heart attack in her father; Heart disease in her father; Kidney  failure in her mother; Other in her brother; Rheum arthritis in her mother; Stroke in her father and maternal grandfather.    ROS:  Please see the history of present illness.   Otherwise, review of systems are positive for {NONE DEFAULTED:18576}.   All other systems are reviewed and negative.    PHYSICAL EXAM: VS:  There were no vitals taken for this visit. , BMI There is no height or weight on file to calculate BMI. GENERAL:  Well appearing HEENT:  Pupils equal round and reactive, fundi not visualized, oral mucosa unremarkable NECK:  No jugular venous distention, waveform within normal limits, carotid upstroke brisk and symmetric, no bruits, no thyromegaly LYMPHATICS:  No cervical, inguinal adenopathy LUNGS:  Clear to auscultation bilaterally BACK:  No CVA tenderness CHEST:  Unremarkable HEART:  PMI not displaced or sustained,S1 and S2 within normal limits, no S3, no S4, no clicks, no rubs, *** murmurs ABD:  Flat, positive bowel sounds normal in frequency in pitch, no bruits, no rebound, no guarding, no midline pulsatile mass, no hepatomegaly, no splenomegaly EXT:  2 plus pulses throughout, no edema, no cyanosis no clubbing SKIN:  No rashes no nodules NEURO:  Cranial nerves II through XII grossly intact, motor grossly intact throughout PSYCH:  Cognitively intact, oriented to person place and time    EKG:  EKG {ACTION; IS/IS XBM:[id number]} ordered today. The ekg ordered today demonstrates ***   Recent Labs: 01/13/2022: TSH 2.080 07/13/2022: ALT 22; BUN 13; Creatinine, Ser 0.83; Hemoglobin 14.4; Platelets 232; Potassium 4.7; Sodium 142    Lipid Panel    Component Value Date/Time   CHOL 201 (H) 07/13/2022 1005   TRIG 152 (H) 07/13/2022 1005   HDL 42 07/13/2022 1005   CHOLHDL 4.8 (H) 07/13/2022 1005   LDLCALC 132 (H) 07/13/2022 1005      Wt Readings from Last 3 Encounters:  07/13/22 142 lb (64.4 kg)  01/13/22 143 lb (64.9 kg)  07/15/21 147 lb (66.7 kg)      Other studies  Reviewed: Additional studies/ records that were reviewed today include: ***. Review of the above records demonstrates:  Please see elsewhere in the note.  ***   ASSESSMENT AND PLAN:  Aortic atherosclerosis:   ***  Hypertension:  ***   DM:  ***  Dyslipidemia:  ***  Current medicines are reviewed at length with the patient today.  The patient {ACTIONS; HAS/DOES NOT HAVE:19233} concerns regarding medicines.  The following changes have been made:  {PLAN; NO CHANGE:13088:s}  Labs/ tests ordered today include: *** No orders of the defined types were placed in this encounter.    Disposition:   FU with ***    Signed, Minus Breeding, MD  10/10/2022 10:52 AM    [place]

## 2022-10-13 ENCOUNTER — Ambulatory Visit: Payer: Medicare HMO | Admitting: Cardiology

## 2022-10-13 DIAGNOSIS — I10 Essential (primary) hypertension: Secondary | ICD-10-CM

## 2022-10-13 DIAGNOSIS — E118 Type 2 diabetes mellitus with unspecified complications: Secondary | ICD-10-CM

## 2022-10-13 DIAGNOSIS — E785 Hyperlipidemia, unspecified: Secondary | ICD-10-CM

## 2022-10-13 DIAGNOSIS — I700 Atherosclerosis of aorta: Secondary | ICD-10-CM

## 2022-11-16 ENCOUNTER — Other Ambulatory Visit: Payer: Self-pay | Admitting: Family Medicine

## 2022-11-16 DIAGNOSIS — H60542 Acute eczematoid otitis externa, left ear: Secondary | ICD-10-CM

## 2022-11-26 ENCOUNTER — Telehealth: Payer: Self-pay | Admitting: *Deleted

## 2022-11-26 NOTE — Telephone Encounter (Signed)
Windy Carina (Key: [id number]) - [id number] Repatha SureClick '140MG'$ /ML auto-injectors Status: PA Response - Approved

## 2022-11-27 ENCOUNTER — Other Ambulatory Visit: Payer: Self-pay | Admitting: Family Medicine

## 2022-11-27 DIAGNOSIS — K589 Irritable bowel syndrome without diarrhea: Secondary | ICD-10-CM

## 2022-11-27 DIAGNOSIS — K219 Gastro-esophageal reflux disease without esophagitis: Secondary | ICD-10-CM

## 2022-11-27 DIAGNOSIS — I10 Essential (primary) hypertension: Secondary | ICD-10-CM

## 2022-12-20 NOTE — Telephone Encounter (Signed)
Sherry Salinas (Key: [id number]) Repatha SureClick '140MG'$ /ML auto-injectors Form Administrator, sports PA Form Created 1 month ago Sent to Plan 24 days ago Plan Response 24 days ago Submit Clinical Questions 24 days ago Determination Favorable 24 days ago Message from Plan PA Case: [id number], Status: Approved, Coverage Starts on: 12/06/2021 12:00:00 AM, Coverage Ends on: 12/06/2023 12:00:00 AM. Questions? Contact [phone].

## 2023-01-13 ENCOUNTER — Encounter: Payer: Self-pay | Admitting: Family Medicine

## 2023-01-13 ENCOUNTER — Ambulatory Visit (INDEPENDENT_AMBULATORY_CARE_PROVIDER_SITE_OTHER): Payer: Medicare HMO | Admitting: Family Medicine

## 2023-01-13 VITALS — BP 134/77 | HR 67 | Temp 98.2°F | Ht 63.0 in | Wt 144.6 lb

## 2023-01-13 DIAGNOSIS — M25512 Pain in left shoulder: Secondary | ICD-10-CM

## 2023-01-13 DIAGNOSIS — I10 Essential (primary) hypertension: Secondary | ICD-10-CM | POA: Diagnosis not present

## 2023-01-13 DIAGNOSIS — I700 Atherosclerosis of aorta: Secondary | ICD-10-CM

## 2023-01-13 DIAGNOSIS — M25551 Pain in right hip: Secondary | ICD-10-CM | POA: Diagnosis not present

## 2023-01-13 DIAGNOSIS — K589 Irritable bowel syndrome without diarrhea: Secondary | ICD-10-CM

## 2023-01-13 DIAGNOSIS — J301 Allergic rhinitis due to pollen: Secondary | ICD-10-CM | POA: Diagnosis not present

## 2023-01-13 DIAGNOSIS — F411 Generalized anxiety disorder: Secondary | ICD-10-CM | POA: Diagnosis not present

## 2023-01-13 DIAGNOSIS — R7303 Prediabetes: Secondary | ICD-10-CM

## 2023-01-13 DIAGNOSIS — Z79899 Other long term (current) drug therapy: Secondary | ICD-10-CM | POA: Diagnosis not present

## 2023-01-13 DIAGNOSIS — E782 Mixed hyperlipidemia: Secondary | ICD-10-CM

## 2023-01-13 DIAGNOSIS — G8929 Other chronic pain: Secondary | ICD-10-CM

## 2023-01-13 DIAGNOSIS — M25561 Pain in right knee: Secondary | ICD-10-CM | POA: Diagnosis not present

## 2023-01-13 DIAGNOSIS — E118 Type 2 diabetes mellitus with unspecified complications: Secondary | ICD-10-CM | POA: Diagnosis not present

## 2023-01-13 LAB — BAYER DCA HB A1C WAIVED: HB A1C (BAYER DCA - WAIVED): 5.3 % (ref 4.8–5.6)

## 2023-01-13 MED ORDER — CELECOXIB 100 MG PO CAPS: 100.0000 mg | ORAL_CAPSULE | Freq: Two times a day (BID) | ORAL | 1 refills | Status: DC

## 2023-01-13 MED ORDER — AMLODIPINE BESYLATE 5 MG PO TABS: 5.0000 mg | ORAL_TABLET | Freq: Every day | ORAL | 1 refills | Status: DC

## 2023-01-13 MED ORDER — TRAMADOL HCL 50 MG PO TABS: 50.0000 mg | ORAL_TABLET | Freq: Two times a day (BID) | ORAL | 0 refills | Status: AC | PRN

## 2023-01-13 MED ORDER — PRAVASTATIN SODIUM 10 MG PO TABS: 10.0000 mg | ORAL_TABLET | Freq: Every day | ORAL | 1 refills | Status: DC

## 2023-01-13 MED ORDER — TRAMADOL HCL 50 MG PO TABS: 50.0000 mg | ORAL_TABLET | Freq: Two times a day (BID) | ORAL | 0 refills | Status: DC | PRN

## 2023-01-13 MED ORDER — DICYCLOMINE HCL 20 MG PO TABS: 20.0000 mg | ORAL_TABLET | Freq: Three times a day (TID) | ORAL | 3 refills | Status: DC

## 2023-01-13 MED ORDER — BUSPIRONE HCL 7.5 MG PO TABS: 7.5000 mg | ORAL_TABLET | Freq: Three times a day (TID) | ORAL | 1 refills | Status: DC

## 2023-01-13 MED ORDER — ONDANSETRON HCL 4 MG PO TABS: 4.0000 mg | ORAL_TABLET | Freq: Three times a day (TID) | ORAL | 0 refills | Status: DC | PRN

## 2023-01-13 MED ORDER — LEVOCETIRIZINE DIHYDROCHLORIDE 5 MG PO TABS: 5.0000 mg | ORAL_TABLET | Freq: Every evening | ORAL | 1 refills | Status: DC

## 2023-01-13 MED ORDER — METHOCARBAMOL 500 MG PO TABS: 500.0000 mg | ORAL_TABLET | Freq: Three times a day (TID) | ORAL | 5 refills | Status: DC | PRN

## 2023-01-13 MED ORDER — EZETIMIBE 10 MG PO TABS: 10.0000 mg | ORAL_TABLET | Freq: Every day | ORAL | 1 refills | Status: DC

## 2023-01-13 NOTE — Progress Notes (Signed)
Subjective:  Patient ID: Sherry Salinas, female    DOB: 05/30/1956, 67 y.o.   MRN: [id number]  Patient Care Team: Baruch Gouty, FNP as PCP - General (Family Medicine) Gala Romney Cristopher Estimable, MD as Consulting Physician (Gastroenterology) Lavera Guise, [facility] as [facility] (Pharmacist)   Chief Complaint:  Establish Care Blanch Media patient )   HPI: Sherry Salinas is a 67 y.o. female presenting on 01/13/2023 for Establish Care Blanch Media patient )  Pt presents today to establish care with new PCP and for management of chronic medical conditions.   1. Mixed hyperlipidemia Currently on Zetia as she has been intolerant to many statins in the past. She has not tried pravastatin. She does report trying to follow a healthy diet but does not exercise on a regular basis due to chronic pain.   2. Prediabetes A1 6.1 in the past. Last A1C normal. States this was the only time she can recall her A1C being above normal denies polyuria, polyphagia, or polydipsia.   3. Primary hypertension Currently on toprol, HCTZ,and norvasc, tolerating well. Denies chest pain, headaches, visual changes, palpitations, leg swelling, weakness, confusion, or syncope.   4. GAD (generalized anxiety disorder) Was prescribed Pristiq by former provider but did not start this medication. She is taking Buspar as prescribed.     01/13/2023    9:19 AM 07/13/2022   10:13 AM 01/13/2022    1:57 PM 07/15/2021    8:38 AM  GAD 7 : Generalized Anxiety Score  Nervous, Anxious, on Edge '2 1 2 2  '$ Control/stop worrying '2 3 2 2  '$ Worry too much - different things '2 3 1 2  '$ Trouble relaxing '2 3 3 2  '$ Restless '2 3 3 2  '$ Easily annoyed or irritable '1 2 1 2  '$ Afraid - awful might happen 0 0 0 1  Total GAD 7 Score '11 15 12 13  '$ Anxiety Difficulty Not difficult at all Somewhat difficult Somewhat difficult Somewhat difficult       01/13/2023    9:19 AM 07/13/2022   10:12 AM 01/13/2022    1:56 PM 07/15/2021    8:38 AM 06/04/2021     4:42 PM  Depression screen PHQ 2/9  Decreased Interest '1 3 3 2 3  '$ Down, Depressed, Hopeless '1 2 3 2 3  '$ PHQ - 2 Score '2 5 6 4 6  '$ Altered sleeping '3 3 3 3 3  '$ Tired, decreased energy '3 3 3 2 3  '$ Change in appetite 0 0 0 0 0  Feeling bad or failure about yourself  0 0 0 0 0  Trouble concentrating '2 2 2 2 1  '$ Moving slowly or fidgety/restless 0 0 0 0 0  Suicidal thoughts 0 0 0 0 0  PHQ-9 Score '10 13 14 11 13  '$ Difficult doing work/chores Not difficult at all Not difficult at all Somewhat difficult Somewhat difficult Very difficult     5. Chronic left shoulder pain 6. Chronic right hip pain 7. Chronic pain of right knee 8. Controlled substance agreement signed Pain assessment: Cause of pain- chronic pain multiple joints Pain location- hip, knee, shoulder  Pain on scale of 1-10- 6-7/10 Frequency- daily What increases pain-lying down What makes pain Better-activity, medications  Effects on ADL - minimal Any change in general medical condition-no Current opioids rx- Tramadol 50 mg BID PRN # meds rx- #60 Effectiveness of current meds-good Adverse reactions from pain meds-none Morphine equivalent- 10 MME/day Pill count performed-No Last  drug screen - 01/13/2022 Urine drug screen today- Yes Was the [facility] reviewed- yes  If yes were their any concerning findings? - no Overdose risk: low    01/13/2023    9:46 PM  Opioid Risk   Alcohol 0  Illegal Drugs 0  Rx Drugs 0  Alcohol 0  Illegal Drugs 0  Rx Drugs 0  Age between 16-45 years  0  History of Preadolescent Sexual Abuse 0  Psychological Disease 2  ADD Negative  OCD Negative  Bipolar Negative  Depression 0  Opioid Risk Tool Scoring 2  Opioid Risk Interpretation Low Risk   Pain contract signed on: today  9. Non-seasonal allergic rhinitis due to pollen Has been taking Xyzal with good control of symptoms.   10. Aortic atherosclerosis ([place]) On Zetia, has not been on statin due to intolerance. On ASA 81 mg daily. N anginal  symptoms.  11. Irritable bowel syndrome without diarrhea On Bentyl which controls symptoms well. No hematochezia or melena.      Relevant past medical, surgical, family, and social history reviewed and updated as indicated.  Allergies and medications reviewed and updated. Data reviewed: Chart in Epic.   Past Medical History:  Diagnosis Date   Anxiety    Aortic atherosclerosis ([place])    Arthritis    Hyperlipidemia    Hypertension    IBS (irritable bowel syndrome)    Osteopenia 01/08/2021   Prediabetes 01/18/2022   Vitamin D deficiency 09/14/2017    Past Surgical History:  Procedure Laterality Date   COLONOSCOPY  2015   Dr. Amedeo Plenty: per pt, diverticulosis, 3 polyps, come back in five years.    COLONOSCOPY WITH ESOPHAGOGASTRODUODENOSCOPY (EGD)  02/2003   Dr. Amedeo Plenty: Grade 3 esophagitis, hiatal hernia with patulous GE junction, mild duodenitis.  CLOtest, results unavailable.  Colonoscopy normal.   COLONOSCOPY WITH PROPOFOL N/A 06/23/2021   Procedure: COLONOSCOPY WITH PROPOFOL;  Surgeon: Eloise Harman, DO;  Location: [facility];  Service: Endoscopy;  Laterality: N/A;  10:30am   POLYPECTOMY  06/23/2021   Procedure: POLYPECTOMY INTESTINAL;  Surgeon: Eloise Harman, DO;  Location: [facility];  Service: Endoscopy;;   REPLACEMENT TOTAL KNEE Right 2012   ROTATOR CUFF REPAIR Right     Social History   Socioeconomic History   Marital status: Married    Spouse name: Not on file   Number of children: 2   Years of education: Not on file   Highest education level: Associate degree: academic program  Occupational History   Occupation: Retired    Comment: Previously EMT, FF, and RN  Tobacco Use   Smoking status: Every Day    Packs/day: 1.00    Years: 30.00    Total pack years: 30.00    Types: Cigarettes   Smokeless tobacco: Never  Vaping Use   Vaping Use: Never used  Substance and Sexual Activity   Alcohol use: Never   Drug use: Never   Sexual activity: Not on file   Other Topics Concern   Not on file  Social History Narrative   Lives with husband, son and granddaughter    Social Determinants of Health   Financial Resource Strain: Not on file  Food Insecurity: Not on file  Transportation Needs: Not on file  Physical Activity: Not on file  Stress: Not on file  Social Connections: Not on file  Intimate Partner Violence: Not on file    Outpatient Encounter Medications as of 01/13/2023  Medication Sig   aspirin EC 81 MG  tablet Take 81 mg by mouth daily. Swallow whole.   Calcium-Magnesium-Zinc (CAL-MAG-ZINC PO) Take 1 tablet by mouth daily.   EPINEPHrine 0.3 mg/0.3 mL IJ SOAJ injection Inject 0.3 mg into the muscle as needed for anaphylaxis.   hydrochlorothiazide (HYDRODIURIL) 25 MG tablet TAKE 1 TABLET (25 MG TOTAL) BY MOUTH DAILY.   metoprolol succinate (TOPROL-XL) 50 MG 24 hr tablet TAKE 1 TABLET (50 MG TOTAL) BY MOUTH DAILY. TAKE WITH OR IMMEDIATELY FOLLOWING A MEAL.   pantoprazole (PROTONIX) 40 MG tablet TAKE 1 TABLET (40 MG) BY MOUTH DAILY 30 MINUTES BEFORE BREAKFAST   pravastatin (PRAVACHOL) 10 MG tablet Take 1 tablet (10 mg total) by mouth daily.   [START ON 03/14/2023] traMADol (ULTRAM) 50 MG tablet Take 1 tablet (50 mg total) by mouth 2 (two) times daily as needed.   [START ON 02/12/2023] traMADol (ULTRAM) 50 MG tablet Take 1 tablet (50 mg total) by mouth 2 (two) times daily as needed.   triamcinolone cream (KENALOG) 0.1 % APPLY TOPICALLY TWO TIMES DAILY AS NEEDED   [DISCONTINUED] amLODipine (NORVASC) 5 MG tablet Take 1 tablet (5 mg total) by mouth daily.   [DISCONTINUED] busPIRone (BUSPAR) 7.5 MG tablet Take 1 tablet (7.5 mg total) by mouth 3 (three) times daily.   [DISCONTINUED] celecoxib (CELEBREX) 100 MG capsule Take 1 capsule (100 mg total) by mouth 2 (two) times daily.   [DISCONTINUED] dicyclomine (BENTYL) 20 MG tablet TAKE 1 TABLET THREE TIMES DAILY   [DISCONTINUED] ezetimibe (ZETIA) 10 MG tablet Take 1 tablet (10 mg total) by mouth daily.    [DISCONTINUED] levocetirizine (XYZAL) 5 MG tablet Take 1 tablet (5 mg total) by mouth every evening.   [DISCONTINUED] methocarbamol (ROBAXIN) 500 MG tablet Take 1 tablet (500 mg total) by mouth every 8 (eight) hours as needed for muscle spasms.   [DISCONTINUED] traMADol (ULTRAM) 50 MG tablet Take 1 tablet (50 mg total) by mouth 2 (two) times daily as needed.   amLODipine (NORVASC) 5 MG tablet Take 1 tablet (5 mg total) by mouth daily.   busPIRone (BUSPAR) 7.5 MG tablet Take 1 tablet (7.5 mg total) by mouth 3 (three) times daily.   celecoxib (CELEBREX) 100 MG capsule Take 1 capsule (100 mg total) by mouth 2 (two) times daily.   dicyclomine (BENTYL) 20 MG tablet Take 1 tablet (20 mg total) by mouth 3 (three) times daily.   ezetimibe (ZETIA) 10 MG tablet Take 1 tablet (10 mg total) by mouth daily.   levocetirizine (XYZAL) 5 MG tablet Take 1 tablet (5 mg total) by mouth every evening.   methocarbamol (ROBAXIN) 500 MG tablet Take 1 tablet (500 mg total) by mouth every 8 (eight) hours as needed for muscle spasms.   ondansetron (ZOFRAN) 4 MG tablet Take 1 tablet (4 mg total) by mouth every 8 (eight) hours as needed for nausea or vomiting.   traMADol (ULTRAM) 50 MG tablet Take 1 tablet (50 mg total) by mouth 2 (two) times daily as needed.   [DISCONTINUED] desvenlafaxine (PRISTIQ) 25 MG 24 hr tablet TAKE 1 TABLET (25 MG TOTAL) BY MOUTH DAILY.   [DISCONTINUED] Dextromethorphan-guaiFENesin ([employer] MUCUS DM EXTENDED RELEASE) 30-600 MG TB12 TAKE 1 TABLET BY MOUTH TWICE A DAY   [DISCONTINUED] ondansetron (ZOFRAN) 4 MG tablet Take 4 mg by mouth every 8 (eight) hours as needed for nausea or vomiting.   No facility-administered encounter medications on file as of 01/13/2023.    Allergies  Allergen Reactions   Bee Venom Anaphylaxis   Codeine Anaphylaxis   Statins  Other (See Comments)    Muscle aches   Sulfa Antibiotics Rash   Sulfasalazine Rash    Review of Systems  Constitutional:  Positive for activity  change, appetite change and fatigue. Negative for chills, diaphoresis, fever and unexpected weight change.  HENT:  Positive for rhinorrhea. Negative for congestion, dental problem, drooling, ear discharge, ear pain, facial swelling, hearing loss, mouth sores, nosebleeds, postnasal drip, sinus pressure, sinus pain, sneezing, sore throat, tinnitus, trouble swallowing and voice change.   Eyes: Negative.  Negative for photophobia and visual disturbance.  Respiratory:  Negative for cough, chest tightness and shortness of breath.   Cardiovascular:  Negative for chest pain, palpitations and leg swelling.  Gastrointestinal:  Positive for abdominal pain and diarrhea. Negative for blood in stool, constipation, nausea and vomiting.  Endocrine: Negative.  Negative for polydipsia, polyphagia and polyuria.  Genitourinary:  Negative for decreased urine volume, difficulty urinating, dysuria, frequency and urgency.  Musculoskeletal:  Positive for arthralgias and joint swelling. Negative for back pain, gait problem, myalgias, neck pain and neck stiffness.  Skin: Negative.   Allergic/Immunologic: Negative.   Neurological:  Negative for dizziness, tremors, seizures, syncope, facial asymmetry, speech difficulty, weakness, light-headedness, numbness and headaches.  Hematological: Negative.   Psychiatric/Behavioral:  Positive for agitation, decreased concentration and sleep disturbance. Negative for behavioral problems, confusion, dysphoric mood, hallucinations, self-injury and suicidal ideas. The patient is nervous/anxious. The patient is not hyperactive.   All other systems reviewed and are negative.       Objective:  BP 134/77   Pulse 67   Temp 98.2 F (36.8 C) (Temporal)   Ht '5\' 3"'$  (1.6 m)   Wt 144 lb 9.6 oz (65.6 kg)   SpO2 100%   BMI 25.61 kg/m    Wt Readings from Last 3 Encounters:  01/13/23 144 lb 9.6 oz (65.6 kg)  07/13/22 142 lb (64.4 kg)  01/13/22 143 lb (64.9 kg)    Physical Exam Vitals and  nursing note reviewed.  Constitutional:      General: She is not in acute distress.    Appearance: Normal appearance. She is well-developed, well-groomed and normal weight. She is not ill-appearing, toxic-appearing or diaphoretic.  HENT:     Head: Normocephalic and atraumatic.     Jaw: There is normal jaw occlusion.     Right Ear: Hearing normal.     Left Ear: Hearing normal.     Nose: Nose normal.     Mouth/Throat:     Lips: Pink.     Mouth: Mucous membranes are moist.     Pharynx: Oropharynx is clear. Uvula midline.  Eyes:     General: Lids are normal.     Pupils: Pupils are equal, round, and reactive to light.  Neck:     Thyroid: No thyroid mass, thyromegaly or thyroid tenderness.     Vascular: No carotid bruit or JVD.     Trachea: Trachea and phonation normal.  Cardiovascular:     Rate and Rhythm: Normal rate and regular rhythm.     Chest Wall: PMI is not displaced.     Pulses: Normal pulses.     Heart sounds: Normal heart sounds. No murmur heard.    No friction rub. No gallop.  Pulmonary:     Effort: Pulmonary effort is normal. No respiratory distress.     Breath sounds: Normal breath sounds. No wheezing.  Abdominal:     General: Bowel sounds are normal. There is no abdominal bruit.     Palpations: Abdomen is  soft. There is no hepatomegaly or splenomegaly.  Musculoskeletal:     Cervical back: Normal range of motion and neck supple.     Right lower leg: No edema.     Left lower leg: No edema.  Lymphadenopathy:     Cervical: No cervical adenopathy.  Skin:    General: Skin is warm and dry.     Capillary Refill: Capillary refill takes less than 2 seconds.     Coloration: Skin is not cyanotic, jaundiced or pale.     Findings: No rash.  Neurological:     General: No focal deficit present.     Mental Status: She is alert and oriented to person, place, and time.     Sensory: Sensation is intact.     Motor: Motor function is intact.     Coordination: Coordination is  intact.     Gait: Gait is intact.     Deep Tendon Reflexes: Reflexes are normal and symmetric.  Psychiatric:        Attention and Perception: Attention and perception normal.        Mood and Affect: Mood and affect normal.        Speech: Speech normal.        Behavior: Behavior normal. Behavior is cooperative.        Thought Content: Thought content normal.        Cognition and Memory: Cognition and memory normal.        Judgment: Judgment normal.     Results for orders placed or performed in visit on 01/13/23  Bayer DCA Hb A1c Waived  Result Value Ref Range   HB A1C (BAYER DCA - WAIVED) 5.3 4.8 - 5.6 %       Pertinent labs & imaging results that were available during my care of the patient were reviewed by me and considered in my medical decision making.  Assessment & Plan:  Sherry Salinas was seen today for establish care.  Diagnoses and all orders for this visit:  Mixed hyperlipidemia Has not been on statin therapy due to intolerance. Willing to trial pravastatin as this is less likely to cause myalgias. Diet and exercise encouraged.  -     Lipid panel -     Thyroid Panel With TSH -     ezetimibe (ZETIA) 10 MG tablet; Take 1 tablet (10 mg total) by mouth daily. -     pravastatin (PRAVACHOL) 10 MG tablet; Take 1 tablet (10 mg total) by mouth daily.  Prediabetes A1C 5.3 today. Diet and exercise encouraged.  -     Bayer DCA Hb A1c Waived -     Thyroid Panel With TSH  Primary hypertension BP well controlled. Changes were not made in regimen today. Goal BP is 130/80. Pt aware to report any persistent high or low readings. DASH diet and exercise encouraged. Exercise at least 150 minutes per week and increase as tolerated. Goal BMI > 25. Stress management encouraged. Avoid nicotine and tobacco product use. Avoid excessive alcohol and NSAID's. Avoid more than 2000 mg of sodium daily. Medications as prescribed. Follow up as scheduled.  -     amLODipine (NORVASC) 5 MG tablet; Take 1  tablet (5 mg total) by mouth daily.  GAD (generalized anxiety disorder) Continue below.  -     busPIRone (BUSPAR) 7.5 MG tablet; Take 1 tablet (7.5 mg total) by mouth 3 (three) times daily.  Chronic left shoulder pain Chronic right hip pain Chronic pain of right knee Controlled substance agreement  signed CSA and UDS updated today. PDMP reviewed, no red flags. Continue below. Aware if symptoms worsen, will refer to pain management.  -     ToxASSURE Select 13 (MW), Urine -     traMADol (ULTRAM) 50 MG tablet; Take 1 tablet (50 mg total) by mouth 2 (two) times daily as needed. -     ondansetron (ZOFRAN) 4 MG tablet; Take 1 tablet (4 mg total) by mouth every 8 (eight) hours as needed for nausea or vomiting. -     traMADol (ULTRAM) 50 MG tablet; Take 1 tablet (50 mg total) by mouth 2 (two) times daily as needed. -     traMADol (ULTRAM) 50 MG tablet; Take 1 tablet (50 mg total) by mouth 2 (two) times daily as needed.  Non-seasonal allergic rhinitis due to pollen Well controlled on below, continue.  -     levocetirizine (XYZAL) 5 MG tablet; Take 1 tablet (5 mg total) by mouth every evening.  Aortic atherosclerosis ([facility]) On ASA but not statin, will to trial low dose pravastatin.  -     ezetimibe (ZETIA) 10 MG tablet; Take 1 tablet (10 mg total) by mouth daily. -     pravastatin (PRAVACHOL) 10 MG tablet; Take 1 tablet (10 mg total) by mouth daily.  Irritable bowel syndrome without diarrhea Well controlled on below, will continue.  -     dicyclomine (BENTYL) 20 MG tablet; Take 1 tablet (20 mg total) by mouth 3 (three) times daily.     Continue all other maintenance medications.  Follow up plan: Return if symptoms worsen or fail to improve, for schedule AWV, 3 months CPE.   Continue healthy lifestyle choices, including diet (rich in fruits, vegetables, and lean proteins, and low in salt and simple carbohydrates) and exercise (at least 30 minutes of moderate physical activity  daily).  Educational handout given for health maintenance  The above assessment and management plan was discussed with the patient. The patient verbalized understanding of and has agreed to the management plan. Patient is aware to call the clinic if they develop any new symptoms or if symptoms persist or worsen. Patient is aware when to return to the clinic for a follow-up visit. Patient educated on when it is appropriate to go to the emergency department.   Monia Pouch, FNP-C [facility] [phone]

## 2023-01-14 LAB — LIPID PANEL
Chol/HDL Ratio: 4.9 ratio — ABNORMAL HIGH (ref 0.0–4.4)
Cholesterol, Total: 207 mg/dL — ABNORMAL HIGH (ref 100–199)
HDL: 42 mg/dL (ref >39)
LDL Chol Calc (NIH): 126 mg/dL — ABNORMAL HIGH (ref 0–99)
Triglycerides: 219 mg/dL — ABNORMAL HIGH (ref 0–149)
VLDL Cholesterol Cal: 39 mg/dL (ref 5–40)

## 2023-01-14 LAB — THYROID PANEL WITH TSH
Free Thyroxine Index: 1.5 (ref 1.2–4.9)
T3 Uptake Ratio: 24 % (ref 24–39)
T4, Total: 6.4 ug/dL (ref 4.5–12.0)
TSH: 2.38 u[IU]/mL (ref 0.450–4.500)

## 2023-01-17 LAB — TOXASSURE SELECT 13 (MW), URINE

## 2023-02-04 ENCOUNTER — Ambulatory Visit (INDEPENDENT_AMBULATORY_CARE_PROVIDER_SITE_OTHER): Payer: Medicare HMO

## 2023-02-04 VITALS — Ht 63.0 in | Wt 140.0 lb

## 2023-02-04 DIAGNOSIS — Z0000 Encounter for general adult medical examination without abnormal findings: Secondary | ICD-10-CM | POA: Diagnosis not present

## 2023-02-04 NOTE — Patient Instructions (Signed)
Ms. Sherry Salinas , Thank you for taking time to come for your Medicare Wellness Visit. I appreciate your ongoing commitment to your health goals. Please review the following plan we discussed and let me know if I can assist you in the future.   These are the goals we discussed:  Goals      AWV     02/03/2021 AWV Goal: Exercise for General Health  Patient will verbalize understanding of the benefits of increased physical activity: Exercising regularly is important. It will improve your overall fitness, flexibility, and endurance. Regular exercise also will improve your overall health. It can help you control your weight, reduce stress, and improve your bone density. Over the next year, patient will increase physical activity as tolerated with a goal of at least 150 minutes of moderate physical activity per week.  You can tell that you are exercising at a moderate intensity if your heart starts beating faster and you start breathing faster but can still hold a conversation. Moderate-intensity exercise ideas include: Walking 1 mile (1.6 km) in about 15 minutes Biking Hiking Golfing Dancing Water aerobics Patient will verbalize understanding of everyday activities that increase physical activity by providing examples like the following: Yard work, such as: Sales promotion account executive Gardening Washing windows or floors Patient will be able to explain general safety guidelines for exercising:  Before you start a new exercise program, talk with your health care provider. Do not exercise so much that you hurt yourself, feel dizzy, or get very short of breath. Wear comfortable clothes and wear shoes with good support. Drink plenty of water while you exercise to prevent dehydration or heat stroke. Work out until your breathing and your heartbeat get faster.         This is a list of the screening recommended for you and due  dates:  Health Maintenance  Topic Date Due   Screening for Lung Cancer  Never done   COVID-19 Vaccine (3 - Pfizer risk series) 08/28/2020   Flu Shot  03/06/2023*   Zoster (Shingles) Vaccine (1 of 2) 04/13/2023*   Pneumonia Vaccine (2 of 2 - PPSV23 or PCV20) 07/14/2023*   DEXA scan (bone density measurement)  01/14/2024*   Medicare Annual Wellness Visit  02/04/2024   Colon Cancer Screening  06/23/2026   DTaP/Tdap/Td vaccine (3 - Td or Tdap) 10/21/2026   Hepatitis C Screening: USPSTF Recommendation to screen - Ages 28-79 yo.  Completed   HPV Vaccine  Aged Out   Cologuard (Stool DNA test)  Discontinued  *Topic was postponed. The date shown is not the original due date.    Advanced directives: Advance directive discussed with you today. I have provided a copy for you to complete at home and have notarized. Once this is complete please bring a copy in to our office so we can scan it into your chart.   Conditions/risks identified: Aim for 30 minutes of exercise or brisk walking, 6-8 glasses of water, and 5 servings of fruits and vegetables each day.   Next appointment: Follow up in one year for your annual wellness visit    Preventive Care 65 Years and Older, Female Preventive care refers to lifestyle choices and visits with your health care provider that can promote health and wellness. What does preventive care include? A yearly physical exam. This is also called an annual well check. Dental exams once or twice a year. Routine eye exams. Ask  your health care provider how often you should have your eyes checked. Personal lifestyle choices, including: Daily care of your teeth and gums. Regular physical activity. Eating a healthy diet. Avoiding tobacco and drug use. Limiting alcohol use. Practicing safe sex. Taking low-dose aspirin every day. Taking vitamin and mineral supplements as recommended by your health care provider. What happens during an annual well check? The services and  screenings done by your health care provider during your annual well check will depend on your age, overall health, lifestyle risk factors, and family history of disease. Counseling  Your health care provider may ask you questions about your: Alcohol use. Tobacco use. Drug use. Emotional well-being. Home and relationship well-being. Sexual activity. Eating habits. History of falls. Memory and ability to understand (cognition). Work and work Statistician. Reproductive health. Screening  You may have the following tests or measurements: Height, weight, and BMI. Blood pressure. Lipid and cholesterol levels. These may be checked every 5 years, or more frequently if you are over 59 years old. Skin check. Lung cancer screening. You may have this screening every year starting at age 66 if you have a 30-pack-year history of smoking and currently smoke or have quit within the past 15 years. Fecal occult blood test (FOBT) of the stool. You may have this test every year starting at age 83. Flexible sigmoidoscopy or colonoscopy. You may have a sigmoidoscopy every 5 years or a colonoscopy every 10 years starting at age 33. Hepatitis C blood test. Hepatitis B blood test. Sexually transmitted disease (STD) testing. Diabetes screening. This is done by checking your blood sugar (glucose) after you have not eaten for a while (fasting). You may have this done every 1-3 years. Bone density scan. This is done to screen for osteoporosis. You may have this done starting at age 52. Mammogram. This may be done every 1-2 years. Talk to your health care provider about how often you should have regular mammograms. Talk with your health care provider about your test results, treatment options, and if necessary, the need for more tests. Vaccines  Your health care provider may recommend certain vaccines, such as: Influenza vaccine. This is recommended every year. Tetanus, diphtheria, and acellular pertussis (Tdap,  Td) vaccine. You may need a Td booster every 10 years. Zoster vaccine. You may need this after age 44. Pneumococcal 13-valent conjugate (PCV13) vaccine. One dose is recommended after age 76. Pneumococcal polysaccharide (PPSV23) vaccine. One dose is recommended after age 52. Talk to your health care provider about which screenings and vaccines you need and how often you need them. This information is not intended to replace advice given to you by your health care provider. Make sure you discuss any questions you have with your health care provider. Document Released: 12/19/2015 Document Revised: 08/11/2016 Document Reviewed: 09/23/2015 Elsevier Interactive Patient Education  2017 [employer] Prevention in the Home Falls can cause injuries. They can happen to people of all ages. There are many things you can do to make your home safe and to help prevent falls. What can I do on the outside of my home? Regularly fix the edges of walkways and driveways and fix any cracks. Remove anything that might make you trip as you walk through a door, such as a raised step or threshold. Trim any bushes or trees on the path to your home. Use bright outdoor lighting. Clear any walking paths of anything that might make someone trip, such as rocks or tools. Regularly check to see  if handrails are loose or broken. Make sure that both sides of any steps have handrails. Any raised decks and porches should have guardrails on the edges. Have any leaves, snow, or ice cleared regularly. Use sand or salt on walking paths during winter. Clean up any spills in your garage right away. This includes oil or grease spills. What can I do in the bathroom? Use night lights. Install grab bars by the toilet and in the tub and shower. Do not use towel bars as grab bars. Use non-skid mats or decals in the tub or shower. If you need to sit down in the shower, use a plastic, non-slip stool. Keep the floor dry. Clean up any  water that spills on the floor as soon as it happens. Remove soap buildup in the tub or shower regularly. Attach bath mats securely with double-sided non-slip rug tape. Do not have throw rugs and other things on the floor that can make you trip. What can I do in the bedroom? Use night lights. Make sure that you have a light by your bed that is easy to reach. Do not use any sheets or blankets that are too big for your bed. They should not hang down onto the floor. Have a firm chair that has side arms. You can use this for support while you get dressed. Do not have throw rugs and other things on the floor that can make you trip. What can I do in the kitchen? Clean up any spills right away. Avoid walking on wet floors. Keep items that you use a lot in easy-to-reach places. If you need to reach something above you, use a strong step stool that has a grab bar. Keep electrical cords out of the way. Do not use floor polish or wax that makes floors slippery. If you must use wax, use non-skid floor wax. Do not have throw rugs and other things on the floor that can make you trip. What can I do with my stairs? Do not leave any items on the stairs. Make sure that there are handrails on both sides of the stairs and use them. Fix handrails that are broken or loose. Make sure that handrails are as long as the stairways. Check any carpeting to make sure that it is firmly attached to the stairs. Fix any carpet that is loose or worn. Avoid having throw rugs at the top or bottom of the stairs. If you do have throw rugs, attach them to the floor with carpet tape. Make sure that you have a light switch at the top of the stairs and the bottom of the stairs. If you do not have them, ask someone to add them for you. What else can I do to help prevent falls? Wear shoes that: Do not have high heels. Have rubber bottoms. Are comfortable and fit you well. Are closed at the toe. Do not wear sandals. If you use a  stepladder: Make sure that it is fully opened. Do not climb a closed stepladder. Make sure that both sides of the stepladder are locked into place. Ask someone to hold it for you, if possible. Clearly mark and make sure that you can see: Any grab bars or handrails. First and last steps. Where the edge of each step is. Use tools that help you move around (mobility aids) if they are needed. These include: Canes. Walkers. Scooters. Crutches. Turn on the lights when you go into a dark area. Replace any light bulbs as soon  as they burn out. Set up your furniture so you have a clear path. Avoid moving your furniture around. If any of your floors are uneven, fix them. If there are any pets around you, be aware of where they are. Review your medicines with your doctor. Some medicines can make you feel dizzy. This can increase your chance of falling. Ask your doctor what other things that you can do to help prevent falls. This information is not intended to replace advice given to you by your health care provider. Make sure you discuss any questions you have with your health care provider. Document Released: 09/18/2009 Document Revised: 04/29/2016 Document Reviewed: 12/27/2014 Elsevier Interactive Patient Education  2017 [employer].

## 2023-02-04 NOTE — Progress Notes (Signed)
Subjective:   Sherry Salinas is a 67 y.o. female who presents for Medicare Annual (Subsequent) preventive examination. I connected with  Rashanti Kujath Zachar on 02/04/23 by a audio enabled telemedicine application and verified that I am speaking with the correct person using two identifiers.  Patient Location: Home  Provider Location: Home Office  I discussed the limitations of evaluation and management by telemedicine. The patient expressed understanding and agreed to proceed.  Review of Systems     Cardiac Risk Factors include: advanced age (>78mn, >>56women)     Objective:    Today's Vitals   02/04/23 1432  Weight: 140 lb (63.5 kg)  Height: '5\' 3"'$  (1.6 m)   Body mass index is 24.8 kg/m.     02/04/2023    2:35 PM 06/23/2021    7:06 AM 02/03/2021   10:43 AM  Advanced Directives  Does Patient Have a Medical Advance Directive? Yes No Yes  [place]  Living will;Healthcare Power of Attorney  Does patient want to make changes to medical advance directive?   No - Patient declined  Copy of [place] Chart? No - copy requested  No - copy requested  Would patient like information on creating a medical advance directive?  No - Patient declined     Current Medications (verified) Outpatient Encounter Medications as of 02/04/2023  Medication Sig   amLODipine (NORVASC) 5 MG tablet Take 1 tablet (5 mg total) by mouth daily.   aspirin EC 81 MG tablet Take 81 mg by mouth daily. Swallow whole.   busPIRone (BUSPAR) 7.5 MG tablet Take 1 tablet (7.5 mg total) by mouth 3 (three) times daily.   Calcium-Magnesium-Zinc (CAL-MAG-ZINC PO) Take 1 tablet by mouth daily.   celecoxib (CELEBREX) 100 MG capsule Take 1 capsule (100 mg total) by mouth 2 (two) times daily.   dicyclomine (BENTYL) 20 MG tablet Take 1 tablet (20 mg total) by mouth 3 (three) times daily.   EPINEPHrine 0.3 mg/0.3 mL IJ SOAJ injection Inject 0.3 mg into  the muscle as needed for anaphylaxis.   ezetimibe (ZETIA) 10 MG tablet Take 1 tablet (10 mg total) by mouth daily.   hydrochlorothiazide (HYDRODIURIL) 25 MG tablet TAKE 1 TABLET (25 MG TOTAL) BY MOUTH DAILY.   levocetirizine (XYZAL) 5 MG tablet Take 1 tablet (5 mg total) by mouth every evening.   methocarbamol (ROBAXIN) 500 MG tablet Take 1 tablet (500 mg total) by mouth every 8 (eight) hours as needed for muscle spasms.   metoprolol succinate (TOPROL-XL) 50 MG 24 hr tablet TAKE 1 TABLET (50 MG TOTAL) BY MOUTH DAILY. TAKE WITH OR IMMEDIATELY FOLLOWING A MEAL.   ondansetron (ZOFRAN) 4 MG tablet Take 1 tablet (4 mg total) by mouth every 8 (eight) hours as needed for nausea or vomiting.   pantoprazole (PROTONIX) 40 MG tablet TAKE 1 TABLET (40 MG) BY MOUTH DAILY 30 MINUTES BEFORE BREAKFAST   pravastatin (PRAVACHOL) 10 MG tablet Take 1 tablet (10 mg total) by mouth daily.   traMADol (ULTRAM) 50 MG tablet Take 1 tablet (50 mg total) by mouth 2 (two) times daily as needed.   [START ON 03/14/2023] traMADol (ULTRAM) 50 MG tablet Take 1 tablet (50 mg total) by mouth 2 (two) times daily as needed.   [START ON 02/12/2023] traMADol (ULTRAM) 50 MG tablet Take 1 tablet (50 mg total) by mouth 2 (two) times daily as needed.   triamcinolone cream (KENALOG) 0.1 % APPLY  TOPICALLY TWO TIMES DAILY AS NEEDED   No facility-administered encounter medications on file as of 02/04/2023.    Allergies (verified) Bee venom, Codeine, Statins, Sulfa antibiotics, and Sulfasalazine   History: Past Medical History:  Diagnosis Date   Anxiety    Aortic atherosclerosis (HCC)    Arthritis    Hyperlipidemia    Hypertension    IBS (irritable bowel syndrome)    Osteopenia 01/08/2021   Prediabetes 01/18/2022   Vitamin D deficiency 09/14/2017   Past Surgical History:  Procedure Laterality Date   COLONOSCOPY  2015   Dr. Amedeo Plenty: per pt, diverticulosis, 3 polyps, come back in five years.    COLONOSCOPY WITH ESOPHAGOGASTRODUODENOSCOPY  (EGD)  02/2003   Dr. Amedeo Plenty: Grade 3 esophagitis, hiatal hernia with patulous GE junction, mild duodenitis.  CLOtest, results unavailable.  Colonoscopy normal.   COLONOSCOPY WITH PROPOFOL N/A 06/23/2021   Procedure: COLONOSCOPY WITH PROPOFOL;  Surgeon: Eloise Harman, DO;  [facility];  Service: Endoscopy;  Laterality: N/A;  10:30am   POLYPECTOMY  06/23/2021   Procedure: POLYPECTOMY INTESTINAL;  Surgeon: Eloise Harman, DO;  Location: [facility];  Service: Endoscopy;;   REPLACEMENT TOTAL KNEE Right 2012   ROTATOR CUFF REPAIR Right    Family History  Problem Relation Age of Onset   Arthritis Mother    Kidney failure Mother    Rheum arthritis Mother    Stroke Father    Heart disease Father    Heart attack Father    Stroke Maternal Grandfather    Early death Paternal Grandmother        Childbirth   Early death Paternal Grandfather        MVA   Colon cancer Paternal Uncle    Other Brother        Psychologist, prison and probation services syndrome   Social History   Socioeconomic History   Marital status: Married    Spouse name: Not on file   Number of children: 2   Years of education: Not on file   Highest education level: Associate degree: academic program  Occupational History   Occupation: Retired    Comment: Previously EMT, FF, and RN  Tobacco Use   Smoking status: Every Day    Packs/day: 1.00    Years: 30.00    Total pack years: 30.00    Types: Cigarettes   Smokeless tobacco: Never  Vaping Use   Vaping Use: Never used  Substance and Sexual Activity   Alcohol use: Never   Drug use: Never   Sexual activity: Not on file  Other Topics Concern   Not on file  Social History Narrative   Lives with husband, son and granddaughter    Social Determinants of Health   Financial Resource Strain: Low Risk  (02/04/2023)   Overall Financial Resource Strain (CARDIA)    Difficulty of Paying Living Expenses: Not hard at all  Food Insecurity: No [place] (02/04/2023)   Hunger Vital  Sign    Worried About Running Out of Food in the Last Year: Never true    [place] in the Last Year: Never true  Transportation Needs: No Transportation Needs (02/04/2023)   PRAPARE - Hydrologist (Medical): No    Lack of Transportation (Non-Medical): No  Physical Activity: Sufficiently Active (02/04/2023)   Exercise Vital Sign    Days of Exercise per Week: 5 days    Minutes of Exercise per Session: 60 min  Stress: No Stress Concern Present (  02/04/2023)   [employer]    Feeling of Stress : Not at all  Social Connections: [place] (02/04/2023)   Social Connection and Isolation Panel [NHANES]    Frequency of Communication with Friends and Family: More than three times a week    Frequency of Social Gatherings with Friends and Family: More than three times a week    Attends Religious Services: More than 4 times per year    Active Member of [employer] or Organizations: Yes    Attends Music therapist: More than 4 times per year    Marital Status: Married    Tobacco Counseling Ready to quit: Not Answered Counseling given: Not Answered   Clinical Intake:  Pre-visit preparation completed: Yes  Pain : No/denies pain     Nutritional Risks: None Diabetes: No  How often do you need to have someone help you when you read instructions, pamphlets, or other written materials from your doctor or pharmacy?: 1 - Never  Diabetic?no   Interpreter Needed?: No  Information entered by :: Jadene Pierini, LPN   Activities of Daily Living    02/04/2023    2:35 PM 02/03/2023    9:47 AM  In your present state of health, do you have any difficulty performing the following activities:  Hearing? 0 0  Vision? 0 0  Difficulty concentrating or making decisions? 0 0  Walking or climbing stairs? 0 1  Dressing or bathing? 0 0  Doing errands, shopping? 0 0  Preparing Food and eating ? N N   Using the Toilet? N N  In the past six months, have you accidently leaked urine? N N  Do you have problems with loss of bowel control? N N  Managing your Medications? N N  Managing your Finances? N N  Housekeeping or managing your Housekeeping? N N    Patient Care Team: Baruch Gouty, FNP as PCP - General (Family Medicine) Gala Romney Cristopher Estimable, MD as Consulting Physician (Gastroenterology) Lavera Guise, [facility] as Nora Management (Pharmacist)  Indicate any recent Medical Services you may have received from other than [facility] providers in the past year (date may be approximate).     Assessment:   This is a routine wellness examination for Bailyn.  Hearing/Vision screen Vision Screening - Comments:: Wears rx glasses - up to date with routine eye exams with  Dr.Johnson  Dietary issues and exercise activities discussed: Current Exercise Habits: Home exercise routine, Type of exercise: walking, Time (Minutes): 60, Frequency (Times/Week): 6, Weekly Exercise (Minutes/Week): 360, Exercise limited by: None identified   Goals Addressed             This Visit's Progress    AWV   On track    02/03/2021 AWV Goal: Exercise for General Health  Patient will verbalize understanding of the benefits of increased physical activity: Exercising regularly is important. It will improve your overall fitness, flexibility, and endurance. Regular exercise also will improve your overall health. It can help you control your weight, reduce stress, and improve your bone density. Over the next year, patient will increase physical activity as tolerated with a goal of at least 150 minutes of moderate physical activity per week.  You can tell that you are exercising at a moderate intensity if your heart starts beating faster and you start breathing faster but can still hold a conversation. Moderate-intensity exercise ideas include: Walking 1 mile (1.6 km) in about 15  minutes Biking Hiking Golfing Dancing Water aerobics Patient will verbalize understanding of everyday activities that increase physical activity by providing examples like the following: Yard work, such as: Sales promotion account executive Gardening Washing windows or floors Patient will be able to explain general safety guidelines for exercising:  Before you start a new exercise program, talk with your health care provider. Do not exercise so much that you hurt yourself, feel dizzy, or get very short of breath. Wear comfortable clothes and wear shoes with good support. Drink plenty of water while you exercise to prevent dehydration or heat stroke. Work out until your breathing and your heartbeat get faster.        Depression Screen    02/04/2023    2:34 PM 01/13/2023    9:19 AM 07/13/2022   10:12 AM 01/13/2022    1:56 PM 07/15/2021    8:38 AM 06/04/2021    4:42 PM 04/06/2021   11:59 AM  PHQ 2/9 Scores  PHQ - 2 Score 0 '2 5 6 4 6 3  '$ PHQ- 9 Score 0 '10 13 14 11 13 10    '$ Fall Risk    02/04/2023    2:33 PM 02/03/2023    9:47 AM 01/13/2023    9:18 AM 07/13/2022    9:58 AM 01/13/2022    1:56 PM  Fall Risk   Falls in the past year? 0 0 0 0 1  Number falls in past yr: 0    0  Injury with Fall? 0    1  Risk for fall due to : No Fall Risks    History of fall(s)  Follow up Falls prevention discussed    Education provided    [place]:  Any stairs in or around the home? No  If so, are there any without handrails? No  Home free of loose throw rugs in walkways, pet beds, electrical cords, etc? Yes  Adequate lighting in your home to reduce risk of falls? Yes   ASSISTIVE DEVICES UTILIZED TO PREVENT FALLS:  Life alert? No  Use of a cane, walker or w/c? No  Grab bars in the bathroom? No  Shower chair or bench in shower? No  Elevated toilet seat or a handicapped toilet? No           02/04/2023    2:36 PM 02/03/2021   10:45 AM  6CIT Screen  What Year? 0 points 0 points  What month? 0 points 0 points  What time? 0 points 0 points  Count back from 20 0 points 0 points  Months in reverse 0 points 0 points  Repeat phrase 0 points 0 points  Total Score 0 points 0 points    Immunizations Immunization History  Administered Date(s) Administered   Influenza Inj Mdck Quad Pf 10/21/2016, 09/14/2017   Influenza Split 08/29/2015, 09/13/2018   Influenza, Seasonal, Injecte, Preservative Fre 09/24/2011, 08/13/2013, 01/27/2015, 09/13/2018   Influenza,inj,Quad PF,6+ Mos 09/13/2018, 10/28/2019, 10/06/2020   Influenza,inj,quad, With Preservative 10/21/2016, 09/14/2017, 09/13/2018   Influenza,trivalent, recombinat, inj, PF 09/24/2011, 08/13/2013, 01/27/2015   Influenza-Unspecified 10/21/2016, 09/14/2017, 09/13/2018   PFIZER(Purple Top)SARS-COV-2 Vaccination 07/10/2020, 07/31/2020   PPD Test 11/12/2013, 01/27/2015   Pneumococcal Conjugate-13 07/15/2021   Tdap 12/06/2006, 10/21/2016    TDAP status: Up to date  Flu Vaccine status: Up to date  Pneumococcal vaccine status: Up to date  Covid-19 vaccine status: Completed vaccines  Qualifies for Shingles  Vaccine? Yes   Zostavax completed No   Shingrix Completed?: No.    Education has been provided regarding the importance of this vaccine. Patient has been advised to call insurance company to determine out of pocket expense if they have not yet received this vaccine. Advised may also receive vaccine at local pharmacy or Health Dept. Verbalized acceptance and understanding.  Screening Tests Health Maintenance  Topic Date Due   Lung Cancer Screening  Never done   COVID-19 Vaccine (3 - Pfizer risk series) 08/28/2020   INFLUENZA VACCINE  03/06/2023 (Originally 07/06/2022)   Zoster Vaccines- Shingrix (1 of 2) 04/13/2023 (Originally 05/12/1975)   Pneumonia Vaccine 12+ Years old (2 of 2 - PPSV23 or PCV20) 07/14/2023 (Originally 09/09/2021)    DEXA SCAN  01/14/2024 (Originally 01/07/2023)   Medicare Annual Wellness (AWV)  02/04/2024   COLONOSCOPY (Pts 45-68yr Insurance coverage will need to be confirmed)  06/23/2026   DTaP/Tdap/Td (3 - Td or Tdap) 10/21/2026   Hepatitis C Screening  Completed   HPV VACCINES  Aged Out   Fecal DNA (Cologuard)  Discontinued    Health Maintenance  Health Maintenance Due  Topic Date Due   Lung Cancer Screening  Never done   COVID-19 Vaccine (3 - Pfizer risk series) 08/28/2020    Colorectal cancer screening: Type of screening: Colonoscopy. Completed 06/23/2021. Repeat every 5 years  Mammogram status: Ordered declined . Pt provided with contact info and advised to call to schedule appt.   Bone Density status: Ordered declined . Pt provided with contact info and advised to call to schedule appt.  Lung Cancer Screening: (Low Dose CT Chest recommended if Age 68-80years, 30 pack-year currently smoking OR have quit w/in 15years.) does qualify.   Lung Cancer Screening Referral: declined   Additional Screening:  Hepatitis C Screening: does not qualify; Completed 09/24/2011 Vision Screening: Recommended annual ophthalmology exams for early detection of glaucoma and other disorders of the eye. Is the patient up to date with their annual eye exam?  Yes  Who is the provider or what is the name of the office in which the patient attends annual eye exams? Dr.Johnson  If pt is not established with a provider, would they like to be referred to a provider to establish care? No .   Dental Screening: Recommended annual dental exams for proper oral hygiene  Community Resource Referral / Chronic Care Management: CRR required this visit?  No   CCM required this visit?  No      Plan:     I have personally reviewed and noted the following in the patient's chart:   Medical and social history Use of alcohol, tobacco or illicit drugs  Current medications and supplements including opioid prescriptions.  Patient is not currently taking opioid prescriptions. Functional ability and status Nutritional status Physical activity Advanced directives List of other physicians Hospitalizations, surgeries, and ER visits in previous 12 months Vitals Screenings to include cognitive, depression, and falls Referrals and appointments  In addition, I have reviewed and discussed with patient certain preventive protocols, quality metrics, and best practice recommendations. A written personalized care plan for preventive services as well as general preventive health recommendations were provided to patient.     LDaphane Shepherd LPN   [id number]  Nurse Notes: declines Mammogram ,  declines Lung Cancer screening

## 2023-02-25 ENCOUNTER — Other Ambulatory Visit: Payer: Self-pay | Admitting: Family Medicine

## 2023-02-25 DIAGNOSIS — Z79899 Other long term (current) drug therapy: Secondary | ICD-10-CM

## 2023-02-25 DIAGNOSIS — G8929 Other chronic pain: Secondary | ICD-10-CM

## 2023-03-01 DIAGNOSIS — Z041 Encounter for examination and observation following transport accident: Secondary | ICD-10-CM | POA: Diagnosis not present

## 2023-03-01 DIAGNOSIS — M19012 Primary osteoarthritis, left shoulder: Secondary | ICD-10-CM | POA: Diagnosis not present

## 2023-03-01 DIAGNOSIS — R42 Dizziness and giddiness: Secondary | ICD-10-CM | POA: Diagnosis not present

## 2023-03-01 DIAGNOSIS — H539 Unspecified visual disturbance: Secondary | ICD-10-CM | POA: Diagnosis not present

## 2023-03-01 DIAGNOSIS — Z7982 Long term (current) use of aspirin: Secondary | ICD-10-CM | POA: Diagnosis not present

## 2023-03-01 DIAGNOSIS — G4489 Other headache syndrome: Secondary | ICD-10-CM | POA: Diagnosis not present

## 2023-03-01 DIAGNOSIS — H53149 Visual discomfort, unspecified: Secondary | ICD-10-CM | POA: Diagnosis not present

## 2023-03-01 DIAGNOSIS — R519 Headache, unspecified: Secondary | ICD-10-CM | POA: Diagnosis not present

## 2023-03-01 DIAGNOSIS — I6381 Other cerebral infarction due to occlusion or stenosis of small artery: Secondary | ICD-10-CM | POA: Diagnosis not present

## 2023-03-01 DIAGNOSIS — Z79899 Other long term (current) drug therapy: Secondary | ICD-10-CM | POA: Diagnosis not present

## 2023-03-01 DIAGNOSIS — M25511 Pain in right shoulder: Secondary | ICD-10-CM | POA: Diagnosis not present

## 2023-03-01 DIAGNOSIS — H5319 Other subjective visual disturbances: Secondary | ICD-10-CM | POA: Diagnosis not present

## 2023-03-01 DIAGNOSIS — M79602 Pain in left arm: Secondary | ICD-10-CM | POA: Diagnosis not present

## 2023-03-01 DIAGNOSIS — I10 Essential (primary) hypertension: Secondary | ICD-10-CM | POA: Diagnosis not present

## 2023-03-01 DIAGNOSIS — M19011 Primary osteoarthritis, right shoulder: Secondary | ICD-10-CM | POA: Diagnosis not present

## 2023-03-01 DIAGNOSIS — M542 Cervicalgia: Secondary | ICD-10-CM | POA: Diagnosis not present

## 2023-03-01 DIAGNOSIS — M25551 Pain in right hip: Secondary | ICD-10-CM | POA: Diagnosis not present

## 2023-03-07 ENCOUNTER — Encounter: Payer: Self-pay | Admitting: Family

## 2023-03-07 ENCOUNTER — Telehealth: Payer: Self-pay | Admitting: Family Medicine

## 2023-03-07 ENCOUNTER — Ambulatory Visit (INDEPENDENT_AMBULATORY_CARE_PROVIDER_SITE_OTHER): Payer: Medicare HMO | Admitting: Family

## 2023-03-07 DIAGNOSIS — S060XAA Concussion with loss of consciousness status unknown, initial encounter: Secondary | ICD-10-CM | POA: Diagnosis not present

## 2023-03-07 DIAGNOSIS — R519 Headache, unspecified: Secondary | ICD-10-CM

## 2023-03-07 DIAGNOSIS — S060XAD Concussion with loss of consciousness status unknown, subsequent encounter: Secondary | ICD-10-CM

## 2023-03-07 DIAGNOSIS — Z09 Encounter for follow-up examination after completed treatment for conditions other than malignant neoplasm: Secondary | ICD-10-CM | POA: Diagnosis not present

## 2023-03-07 DIAGNOSIS — R399 Unspecified symptoms and signs involving the genitourinary system: Secondary | ICD-10-CM

## 2023-03-07 LAB — MICROSCOPIC EXAMINATION
Bacteria, UA: NONE SEEN
Renal Epithel, UA: NONE SEEN /hpf
WBC, UA: NONE SEEN /hpf (ref 0–5)

## 2023-03-07 LAB — URINALYSIS, COMPLETE
Bilirubin, UA: NEGATIVE
Glucose, UA: NEGATIVE
Ketones, UA: NEGATIVE
Leukocytes,UA: NEGATIVE
Nitrite, UA: NEGATIVE
Protein,UA: NEGATIVE
Specific Gravity, UA: 1.01 (ref 1.005–1.030)
Urobilinogen, Ur: 0.2 mg/dL (ref 0.2–1.0)
pH, UA: 6.5 (ref 5.0–7.5)

## 2023-03-07 MED ORDER — MUPIROCIN 2 % EX OINT: 1.0000 | TOPICAL_OINTMENT | Freq: Two times a day (BID) | CUTANEOUS | 0 refills | Status: DC
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
## 2023-03-07 NOTE — Telephone Encounter (Signed)
Patient would like to know if she is going to be referred to the ENT. Said she did not finish her conversation with the provider today about it. Please call back and advise.

## 2023-03-07 NOTE — Telephone Encounter (Signed)
Why does she need ENT?

## 2023-03-07 NOTE — Addendum Note (Signed)
Addended by: Evelina Dun A on: 03/07/2023 04:33 PM   Modules accepted: Orders
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
## 2023-03-07 NOTE — Patient Instructions (Signed)
Concussion, Adult  A concussion is a brain injury from a hard, direct hit (trauma) to the head or body. This direct hit causes the brain to shake quickly back and forth inside the skull. This can damage brain cells and cause chemical changes in the brain. A concussion may also be known as a mild traumatic brain injury (TBI). The effects of a concussion can be serious. If you have a concussion, you should be very careful to avoid having a second concussion. What are the causes? This condition is caused by: A direct hit to your head. Sudden movement of your body that causes your brain to move back and forth inside the skull, such as in a car crash. What are the signs or symptoms? The signs of a concussion can be hard to notice. Early on, they may be missed by you, family members, and health care providers. You may look fine on the outside but may act or feel differently. Every head injury is different. Symptoms are usually temporary but may last for days, weeks, or even months. Some symptoms appear right away, but other symptoms may not show up for hours or days. Physical symptoms Headaches. Dizziness and problems with coordination or balance. Sensitivity to light or noise. Nausea or vomiting. Tiredness (fatigue). Vision or hearing problems. Seizure. Mental and emotional symptoms Irritability or mood changes. Memory problems. Trouble concentrating, organizing, or making decisions. Changes in eating or sleeping patterns. Slowness in thinking, acting or reacting, speaking, or reading. Anxiety or depression. How is this diagnosed? This condition is diagnosed based on your symptoms and injury. You may also have tests, including: Imaging tests, such as a CT scan or an MRI. Neuropsychological tests. These measure your thinking, understanding, learning, and memory. How is this treated? Treatment for this condition includes: Stopping sports or activity if you are injured. Physical and mental  rest and careful observation, usually at home. Medicines to help with symptoms such as headaches, nausea, or difficulty sleeping. Referral to a concussion clinic or rehab center. Follow these instructions at home: Activity Limit activities that require a lot of thought or concentration, such as: Doing homework or job-related work. Watching TV. Using the computer or phone. Playing memory games and doing puzzles. Rest helps your brain heal. Make sure you: Get plenty of sleep. Most adults should get 7-9 hours of sleep each night. Rest during the day. Take naps or rest breaks when you feel tired. Avoid high-intensity exercise or physical activities that take a lot of effort. Stop any activity that worsens symptoms. Your health care provider may recommend light exercise such as walking. Do not do high-risk activities that could cause a second concussion, such as riding a bike or playing sports. Ask your health care provider when you can return to your normal activities, such as school, work, sports, and driving. Your ability to react may be slower after a brain injury. Never do these activities if you are dizzy. General instructions  Take over-the-counter and prescription medicines only as told by your health care provider. Some medicines, such as blood thinners (anticoagulants) and aspirin, may increase the risk for complications, such as bleeding. Avoid taking opioid pain medicine while recovering from a concussion. Do not drink alcohol until your health care provider says you can. Drinking alcohol may slow your recovery and can put you at risk of further injury. Watch your symptoms and tell others around you to do the same. Complications sometimes occur after a concussion. Tell your work manager, teachers, school nurse,   school counselor, coach, or sports trainer about your injury, symptoms, and restrictions. See a mental health therapist if you feel anxious or depressed. Managing this condition  can be challenging. Keep all follow-up visits. Your health care provider will check on your recovery and give you a plan for returning to activities. How is this prevented? Avoiding another brain injury is very important. In rare cases, another injury can lead to permanent brain damage, brain swelling, or death. The risk of this is greatest during the first 7-10 days after a head injury. Avoid injuries by: Stopping activities that could lead to a second concussion, such as contact or recreational sports, until your health care provider says it is okay. Taking these actions once you have returned to sports or activities: Avoid plays or moves that can cause you to crash into another person. This is how most concussions occur. Follow the rules and be respectful of other players. Do not engage in violent or illegal plays. Getting regular exercise that includes strength and balance training. Wearing a properly fitting helmet during sports, biking, or other activities. Helmets can help protect you from serious skull and brain injuries, but they may not protect you from a concussion. Even when wearing a helmet, you should avoid being hit in the head. Where to find more information Centers for Disease Control and Prevention: cdc.gov Contact a health care provider if: Your symptoms do not improve or get worse. You have new symptoms. You have another injury. Your coordination gets worse. You have unusual behavior changes. Get help right away if: You have a severe or worsening headache. You have weakness or numbness in any part of your body, slurred speech, vision changes, or confusion. You vomit repeatedly. You lose consciousness, are sleepier than normal, or are difficult to wake up. You have a seizure. These symptoms may be an emergency. Get help right away. Call 911. Do not wait to see if the symptoms will go away. Do not drive yourself to the hospital. Also, get help right away if: You have  thoughts of hurting yourself or others. Take one of these steps if you feel like you may hurt yourself or others, or have thoughts about taking your own life: Go to your nearest emergency room. Call 911. Call the National Suicide Prevention Lifeline at 1-800-273-8255 or 988. This is open 24 hours a day. Text the Crisis Text Line at 741741. This information is not intended to replace advice given to you by your health care provider. Make sure you discuss any questions you have with your health care provider. Document Revised: 04/16/2022 Document Reviewed: 04/16/2022 Elsevier Patient Education  2023 Elsevier Inc.  

## 2023-03-07 NOTE — Telephone Encounter (Signed)
Patient is aware.  She said you were going to send in something for pain also but the pharmacy  has not received anything yet.
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
## 2023-03-07 NOTE — Telephone Encounter (Signed)
Pt had ear wax in left ear and I could not see her ear drum. Most of her pain was on back of her ear. I will send a rx of bactroban she can apply to back of ear.

## 2023-03-08 ENCOUNTER — Encounter: Payer: Self-pay | Admitting: Gastroenterology

## 2023-03-08 ENCOUNTER — Telehealth: Payer: Self-pay | Admitting: *Deleted

## 2023-03-08 ENCOUNTER — Ambulatory Visit: Payer: Medicare HMO | Admitting: Gastroenterology

## 2023-03-08 VITALS — BP 136/76 | HR 72 | Temp 97.9°F | Ht 63.0 in | Wt 145.2 lb

## 2023-03-08 DIAGNOSIS — K625 Hemorrhage of anus and rectum: Secondary | ICD-10-CM | POA: Insufficient documentation

## 2023-03-08 DIAGNOSIS — K5900 Constipation, unspecified: Secondary | ICD-10-CM | POA: Diagnosis not present

## 2023-03-08 DIAGNOSIS — K6289 Other specified diseases of anus and rectum: Secondary | ICD-10-CM | POA: Diagnosis not present

## 2023-03-08 DIAGNOSIS — R1032 Left lower quadrant pain: Secondary | ICD-10-CM | POA: Diagnosis not present

## 2023-03-08 DIAGNOSIS — R3129 Other microscopic hematuria: Secondary | ICD-10-CM | POA: Diagnosis not present

## 2023-03-08 DIAGNOSIS — K760 Fatty (change of) liver, not elsewhere classified: Secondary | ICD-10-CM | POA: Diagnosis not present

## 2023-03-08 DIAGNOSIS — R1011 Right upper quadrant pain: Secondary | ICD-10-CM | POA: Insufficient documentation

## 2023-03-08 MED ORDER — METRONIDAZOLE 500 MG PO TABS: 500.0000 mg | ORAL_TABLET | Freq: Three times a day (TID) | ORAL | 0 refills | Status: AC

## 2023-03-08 MED ORDER — CIPROFLOXACIN HCL 500 MG PO TABS: 500.0000 mg | ORAL_TABLET | Freq: Two times a day (BID) | ORAL | 0 refills | Status: AC

## 2023-03-08 NOTE — Telephone Encounter (Signed)
PT is already on Celebrex and Ultram. Nothing else I can prescribe. She can take tylenol.

## 2023-03-08 NOTE — Telephone Encounter (Signed)
Patient aware and verbalized understanding. °

## 2023-03-08 NOTE — Patient Instructions (Signed)
Please complete labs at [facility] or at the hospital the day of your CT scan. Take lab orders with you. CT scan to be scheduled.  Take fiber chewable or powder daily. Would advise 3-4 grams of fiber per day in a supplement. If still not able to have good bowel movement, start miralax 1/4 capful daily. You can take up to 1 capful daily if needed. Use vaseline on the outside of your anal opening for discomfort.  Cipro and Flagyl sent to your pharmacy. We will be in touch with results as available.

## 2023-03-08 NOTE — Telephone Encounter (Signed)
[employer] PA: Approval # [id number] DOS: 03/08/23-04/07/23  Pt scheduled for 03/14/23, check in at 8:45 am, liquids only 4 hours prior. Pt informed.

## 2023-03-09 LAB — URINE CULTURE

## 2023-03-10 NOTE — Telephone Encounter (Signed)
Pt informed of CT appointment date and time.

## 2023-03-14 ENCOUNTER — Ambulatory Visit (HOSPITAL_COMMUNITY): Payer: Medicare HMO

## 2023-03-14 ENCOUNTER — Telehealth: Payer: Self-pay | Admitting: Family Medicine

## 2023-03-14 NOTE — Telephone Encounter (Signed)
Already spoke to patient about this through labs

## 2023-03-14 NOTE — Telephone Encounter (Signed)
Recommend patient to make follow up appointment with PCP

## 2023-03-14 NOTE — Telephone Encounter (Signed)
Patient does not feel any better since she received her results from the urine that was taken on 4/1, wants to know if she should repeat this or any of the labs. Stated that she would be coming in on 4/9 to have labs drawn for another office and would like to do everything at one time. Please call back and advise.

## 2023-03-15 ENCOUNTER — Other Ambulatory Visit: Payer: Medicare HMO

## 2023-03-15 DIAGNOSIS — K625 Hemorrhage of anus and rectum: Secondary | ICD-10-CM | POA: Diagnosis not present

## 2023-03-15 DIAGNOSIS — R3129 Other microscopic hematuria: Secondary | ICD-10-CM | POA: Diagnosis not present

## 2023-03-15 DIAGNOSIS — R1011 Right upper quadrant pain: Secondary | ICD-10-CM | POA: Diagnosis not present

## 2023-03-15 DIAGNOSIS — R1032 Left lower quadrant pain: Secondary | ICD-10-CM | POA: Diagnosis not present

## 2023-03-15 DIAGNOSIS — K6289 Other specified diseases of anus and rectum: Secondary | ICD-10-CM | POA: Diagnosis not present

## 2023-03-16 LAB — COMPREHENSIVE METABOLIC PANEL
ALT: 25 IU/L (ref 0–32)
AST: 23 IU/L (ref 0–40)
Albumin/Globulin Ratio: 1.7 (ref 1.2–2.2)
Albumin: 4.2 g/dL (ref 3.9–4.9)
Alkaline Phosphatase: 72 IU/L (ref 44–121)
BUN/Creatinine Ratio: 17 (ref 12–28)
BUN: 15 mg/dL (ref 8–27)
Bilirubin Total: 0.2 mg/dL (ref 0.0–1.2)
CO2: 21 mmol/L (ref 20–29)
Calcium: 10.1 mg/dL (ref 8.7–10.3)
Chloride: 101 mmol/L (ref 96–106)
Creatinine, Ser: 0.88 mg/dL (ref 0.57–1.00)
Globulin, Total: 2.5 g/dL (ref 1.5–4.5)
Glucose: 115 mg/dL — ABNORMAL HIGH (ref 70–99)
Potassium: 3.8 mmol/L (ref 3.5–5.2)
Sodium: 138 mmol/L (ref 134–144)
Total Protein: 6.7 g/dL (ref 6.0–8.5)
eGFR: 72 mL/min/{1.73_m2} (ref >59)

## 2023-03-16 LAB — CBC WITH DIFFERENTIAL/PLATELET
Basophils Absolute: 0 10*3/uL (ref 0.0–0.2)
Basos: 0 %
EOS (ABSOLUTE): 0.1 10*3/uL (ref 0.0–0.4)
Eos: 1 %
Hematocrit: 42.8 % (ref 34.0–46.6)
Hemoglobin: 14.2 g/dL (ref 11.1–15.9)
Immature Grans (Abs): 0 10*3/uL (ref 0.0–0.1)
Immature Granulocytes: 0 %
Lymphocytes Absolute: 2.7 10*3/uL (ref 0.7–3.1)
Lymphs: 30 %
MCH: 32.5 pg (ref 26.6–33.0)
MCHC: 33.2 g/dL (ref 31.5–35.7)
MCV: 98 fL — ABNORMAL HIGH (ref 79–97)
Monocytes Absolute: 0.9 10*3/uL (ref 0.1–0.9)
Monocytes: 10 %
Neutrophils Absolute: 5.4 10*3/uL (ref 1.4–7.0)
Neutrophils: 59 %
Platelets: 278 10*3/uL (ref 150–450)
RBC: 4.37 x10E6/uL (ref 3.77–5.28)
RDW: 11.3 % — ABNORMAL LOW (ref 11.7–15.4)
WBC: 9.1 10*3/uL (ref 3.4–10.8)

## 2023-03-16 LAB — LIPASE: Lipase: 40 U/L (ref 14–72)

## 2023-03-17 ENCOUNTER — Telehealth: Payer: Self-pay | Admitting: Family Medicine
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
## 2023-03-18 MED ORDER — AMOXICILLIN-POT CLAVULANATE 875-125 MG PO TABS: 1.0000 | ORAL_TABLET | Freq: Two times a day (BID) | ORAL | 0 refills | Status: DC

## 2023-03-18 NOTE — Addendum Note (Signed)
Addended by: Jannifer Rodney A on: 03/18/2023 09:32 AM   Modules accepted: Orders

## 2023-03-18 NOTE — Telephone Encounter (Signed)
Augmentin Prescription sent to pharmacy, please schedule her an appointment to follow up with PCP.

## 2023-03-18 NOTE — Telephone Encounter (Signed)
Appt made with pcp 

## 2023-03-18 NOTE — Telephone Encounter (Signed)
Patient aware and verbalized understanding. °

## 2023-03-31 ENCOUNTER — Encounter: Payer: Self-pay | Admitting: Family Medicine

## 2023-03-31 ENCOUNTER — Ambulatory Visit (INDEPENDENT_AMBULATORY_CARE_PROVIDER_SITE_OTHER): Payer: Medicare HMO | Admitting: Family Medicine

## 2023-03-31 VITALS — BP 130/84 | HR 89 | Temp 98.1°F | Ht 62.0 in | Wt 145.0 lb

## 2023-03-31 DIAGNOSIS — R3129 Other microscopic hematuria: Secondary | ICD-10-CM | POA: Diagnosis not present

## 2023-03-31 DIAGNOSIS — N898 Other specified noninflammatory disorders of vagina: Secondary | ICD-10-CM

## 2023-03-31 DIAGNOSIS — R300 Dysuria: Secondary | ICD-10-CM

## 2023-03-31 LAB — MICROSCOPIC EXAMINATION
Bacteria, UA: NONE SEEN
Renal Epithel, UA: NONE SEEN /hpf
WBC, UA: NONE SEEN /hpf (ref 0–5)

## 2023-03-31 LAB — URINALYSIS, ROUTINE W REFLEX MICROSCOPIC
Bilirubin, UA: NEGATIVE
Glucose, UA: NEGATIVE
Ketones, UA: NEGATIVE
Leukocytes,UA: NEGATIVE
Nitrite, UA: NEGATIVE
Protein,UA: NEGATIVE
Specific Gravity, UA: 1.015 (ref 1.005–1.030)
Urobilinogen, Ur: 0.2 mg/dL (ref 0.2–1.0)
pH, UA: 6.5 (ref 5.0–7.5)

## 2023-03-31 MED ORDER — FLUCONAZOLE 150 MG PO TABS
150.0000 mg | ORAL_TABLET | Freq: Once | ORAL | 0 refills | Status: AC
Start: 2023-03-31 — End: 2023-03-31
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
## 2023-04-01 ENCOUNTER — Other Ambulatory Visit: Payer: Self-pay | Admitting: Family Medicine

## 2023-04-01 DIAGNOSIS — H60542 Acute eczematoid otitis externa, left ear: Secondary | ICD-10-CM

## 2023-04-01 NOTE — Telephone Encounter (Signed)
Last office visit 03/31/23 Last refill 11/16/22, 45 grams, 1 refill

## 2023-04-02 LAB — URINE CULTURE

## 2023-04-05 ENCOUNTER — Other Ambulatory Visit: Payer: Self-pay | Admitting: *Deleted

## 2023-04-05 DIAGNOSIS — G8929 Other chronic pain: Secondary | ICD-10-CM

## 2023-04-05 DIAGNOSIS — Z79899 Other long term (current) drug therapy: Secondary | ICD-10-CM

## 2023-04-07 ENCOUNTER — Telehealth: Payer: Self-pay | Admitting: Family Medicine

## 2023-04-07 NOTE — Telephone Encounter (Signed)
Patient would like for a nurse to call her back about urinalysis result from 4/25, unable to attach to lab results.

## 2023-04-07 NOTE — Telephone Encounter (Signed)
Patient aware and verbalizes understanding of culture results.

## 2023-04-12 ENCOUNTER — Other Ambulatory Visit: Payer: Self-pay | Admitting: *Deleted

## 2023-04-12 MED ORDER — NAPROXEN 500 MG PO TBEC: 500.0000 mg | DELAYED_RELEASE_TABLET | Freq: Two times a day (BID) | ORAL | 0 refills | Status: DC

## 2023-04-12 MED ORDER — MUPIROCIN 2 % EX OINT: 1.0000 | TOPICAL_OINTMENT | Freq: Two times a day (BID) | CUTANEOUS | 0 refills | Status: DC

## 2023-04-22 ENCOUNTER — Encounter: Payer: Self-pay | Admitting: *Deleted

## 2023-04-22 ENCOUNTER — Other Ambulatory Visit: Payer: Self-pay | Admitting: *Deleted

## 2023-04-22 DIAGNOSIS — K629 Disease of anus and rectum, unspecified: Secondary | ICD-10-CM

## 2023-04-26 ENCOUNTER — Telehealth: Payer: Self-pay | Admitting: *Deleted

## 2023-04-26 ENCOUNTER — Encounter: Payer: Self-pay | Admitting: *Deleted

## 2023-04-26 NOTE — Telephone Encounter (Signed)
Pt called and needed to reschedule her procedure on 05/13/23. She has been rescheduled until 06/03/23 at 12 pm. New instructions mailed to pt.

## 2023-04-26 NOTE — Telephone Encounter (Signed)
[employer] PA:  Approved Authorization #[id number]  Tracking #[id number] Dates of service 06/03/2023 - 09/02/2023

## 2023-05-03 ENCOUNTER — Other Ambulatory Visit: Payer: Self-pay | Admitting: Family Medicine

## 2023-05-03 NOTE — Telephone Encounter (Signed)
Last office visit 03/31/23 Upcoming appointment 05/13/23 Last refill 04/12/23 22 grams, no refills

## 2023-05-04 ENCOUNTER — Encounter: Payer: Medicare HMO | Admitting: Family Medicine

## 2023-05-11 ENCOUNTER — Other Ambulatory Visit (HOSPITAL_COMMUNITY): Payer: Medicare HMO

## 2023-05-13 ENCOUNTER — Ambulatory Visit: Payer: Medicare HMO | Admitting: Family Medicine

## 2023-05-13 ENCOUNTER — Encounter: Payer: Self-pay | Admitting: Family Medicine

## 2023-05-13 ENCOUNTER — Ambulatory Visit (INDEPENDENT_AMBULATORY_CARE_PROVIDER_SITE_OTHER): Payer: Medicare HMO | Admitting: Family Medicine

## 2023-05-13 DIAGNOSIS — R519 Headache, unspecified: Secondary | ICD-10-CM | POA: Diagnosis not present

## 2023-05-13 DIAGNOSIS — M25512 Pain in left shoulder: Secondary | ICD-10-CM | POA: Diagnosis not present

## 2023-05-13 DIAGNOSIS — G8929 Other chronic pain: Secondary | ICD-10-CM

## 2023-05-13 DIAGNOSIS — M25551 Pain in right hip: Secondary | ICD-10-CM | POA: Diagnosis not present

## 2023-05-13 DIAGNOSIS — M25561 Pain in right knee: Secondary | ICD-10-CM

## 2023-05-13 DIAGNOSIS — F0781 Postconcussional syndrome: Secondary | ICD-10-CM

## 2023-05-13 MED ORDER — TRAMADOL HCL 50 MG PO TABS
50.0000 mg | ORAL_TABLET | Freq: Two times a day (BID) | ORAL | 0 refills | Status: DC | PRN
Start: 2023-07-12 — End: 2023-07-26

## 2023-05-13 MED ORDER — TRAMADOL HCL 50 MG PO TABS
50.0000 mg | ORAL_TABLET | Freq: Two times a day (BID) | ORAL | 0 refills | Status: AC | PRN
Start: 2023-05-13 — End: 2023-06-12
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
## 2023-05-17 NOTE — Addendum Note (Signed)
Addended by: Sonny Masters on: 05/17/2023 08:33 AM   Modules accepted: Orders

## 2023-06-01 ENCOUNTER — Telehealth: Payer: Self-pay | Admitting: Internal Medicine

## 2023-06-01 ENCOUNTER — Encounter (HOSPITAL_COMMUNITY)
Admission: RE | Admit: 2023-06-01 | Discharge: 2023-06-01 | Disposition: A | Discharge status: Home / Self Care | Payer: Medicare HMO | Source: Ambulatory Visit | Attending: Internal Medicine | Admitting: Internal Medicine

## 2023-06-01 DIAGNOSIS — I10 Essential (primary) hypertension: Secondary | ICD-10-CM

## 2023-06-01 DIAGNOSIS — Z79899 Other long term (current) drug therapy: Secondary | ICD-10-CM

## 2023-06-01 NOTE — Progress Notes (Signed)
Called pt for preadmission testing. Pt does not want to have procedure at this time. She said that she had called to cancel but that she was still receiving phone calls about the upcoming procedure. She also wants to cancel an MRI scheduled July 8.

## 2023-06-01 NOTE — Telephone Encounter (Signed)
Preadmission nurse called and said she did the pre-op on the patient today and patient says she wants to cancel the procedure scheduled for 6/28.
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
## 2023-06-02 ENCOUNTER — Telehealth: Payer: Self-pay | Admitting: Internal Medicine

## 2023-06-02 NOTE — Telephone Encounter (Signed)
Patient had LM about needing to sign a release of records and asked to mail it. I called her and LMOM that a release was being mailed to her and it would go out tomorrow and she would need to get that back to me before I could release her records.

## 2023-06-03 ENCOUNTER — Encounter (HOSPITAL_COMMUNITY): Admission: RE | Payer: Self-pay | Source: Home / Self Care

## 2023-06-03 ENCOUNTER — Ambulatory Visit (HOSPITAL_COMMUNITY): Admission: RE | Admit: 2023-06-03 | Payer: Medicare HMO | Source: Home / Self Care | Admitting: Internal Medicine

## 2023-06-03 SURGERY — SIGMOIDOSCOPY, FLEXIBLE
Anesthesia: Monitor Anesthesia Care

## 2023-06-13 ENCOUNTER — Ambulatory Visit (HOSPITAL_COMMUNITY): Payer: Medicare HMO

## 2023-06-15 ENCOUNTER — Other Ambulatory Visit: Payer: Self-pay | Admitting: Family Medicine

## 2023-06-15 DIAGNOSIS — I10 Essential (primary) hypertension: Secondary | ICD-10-CM

## 2023-06-15 DIAGNOSIS — F411 Generalized anxiety disorder: Secondary | ICD-10-CM

## 2023-06-27 ENCOUNTER — Encounter: Payer: Self-pay | Admitting: Nurse Practitioner

## 2023-06-27 ENCOUNTER — Ambulatory Visit (INDEPENDENT_AMBULATORY_CARE_PROVIDER_SITE_OTHER): Payer: Medicare HMO | Admitting: Nurse Practitioner

## 2023-06-27 VITALS — BP 145/78 | HR 74 | Temp 97.6°F | Ht 62.0 in | Wt 148.2 lb

## 2023-06-27 DIAGNOSIS — L237 Allergic contact dermatitis due to plants, except food: Secondary | ICD-10-CM | POA: Diagnosis not present

## 2023-06-27 MED ORDER — PREDNISOLONE ACETATE 1 % OP SUSP
1.0000 [drp] | OPHTHALMIC | 0 refills | Status: DC
Start: 2023-06-27 — End: 2024-02-17
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
## 2023-07-15 ENCOUNTER — Telehealth: Payer: Self-pay | Admitting: Internal Medicine

## 2023-07-15 NOTE — Telephone Encounter (Signed)
Patient left a message asking for estimated cost for procedure.  She said she needed it to send to [facility].    Would that come from you or somewhere else?

## 2023-07-20 ENCOUNTER — Other Ambulatory Visit: Payer: Self-pay | Admitting: *Deleted

## 2023-07-20 DIAGNOSIS — G8929 Other chronic pain: Secondary | ICD-10-CM

## 2023-07-26 ENCOUNTER — Telehealth: Payer: Self-pay | Admitting: Family Medicine

## 2023-07-26 MED ORDER — TRAMADOL HCL 50 MG PO TABS
50.0000 mg | ORAL_TABLET | Freq: Two times a day (BID) | ORAL | 0 refills | Status: DC | PRN
Start: 2023-07-26 — End: 2023-08-19

## 2023-07-26 NOTE — Telephone Encounter (Signed)
Pt reports she has not received her ultram from mail order pharmacy. Called [facility] and verified that medications have not been filled. New prescription sent today.

## 2023-07-26 NOTE — Addendum Note (Signed)
Addended by: Sonny Masters on: 07/26/2023 12:53 PM   Modules accepted: Orders

## 2023-07-28 DIAGNOSIS — H2513 Age-related nuclear cataract, bilateral: Secondary | ICD-10-CM | POA: Diagnosis not present

## 2023-07-28 DIAGNOSIS — H353131 Nonexudative age-related macular degeneration, bilateral, early dry stage: Secondary | ICD-10-CM | POA: Diagnosis not present

## 2023-07-28 DIAGNOSIS — H524 Presbyopia: Secondary | ICD-10-CM | POA: Diagnosis not present

## 2023-07-28 DIAGNOSIS — H43813 Vitreous degeneration, bilateral: Secondary | ICD-10-CM | POA: Diagnosis not present

## 2023-07-28 DIAGNOSIS — H5203 Hypermetropia, bilateral: Secondary | ICD-10-CM | POA: Diagnosis not present

## 2023-07-28 DIAGNOSIS — H52223 Regular astigmatism, bilateral: Secondary | ICD-10-CM | POA: Diagnosis not present

## 2023-08-19 ENCOUNTER — Ambulatory Visit: Payer: Medicare HMO

## 2023-08-19 ENCOUNTER — Encounter: Payer: Self-pay | Admitting: Family Medicine

## 2023-08-19 ENCOUNTER — Other Ambulatory Visit: Payer: Self-pay | Admitting: Family Medicine

## 2023-08-19 ENCOUNTER — Ambulatory Visit (INDEPENDENT_AMBULATORY_CARE_PROVIDER_SITE_OTHER): Payer: Medicare HMO | Admitting: Family Medicine

## 2023-08-19 VITALS — BP 136/68 | HR 97 | Temp 97.8°F | Ht 62.0 in | Wt 145.8 lb

## 2023-08-19 DIAGNOSIS — M791 Myalgia, unspecified site: Secondary | ICD-10-CM

## 2023-08-19 DIAGNOSIS — I700 Atherosclerosis of aorta: Secondary | ICD-10-CM | POA: Diagnosis not present

## 2023-08-19 DIAGNOSIS — I10 Essential (primary) hypertension: Secondary | ICD-10-CM

## 2023-08-19 DIAGNOSIS — M25512 Pain in left shoulder: Secondary | ICD-10-CM

## 2023-08-19 DIAGNOSIS — R5383 Other fatigue: Secondary | ICD-10-CM

## 2023-08-19 DIAGNOSIS — G8929 Other chronic pain: Secondary | ICD-10-CM | POA: Diagnosis not present

## 2023-08-19 DIAGNOSIS — M25551 Pain in right hip: Secondary | ICD-10-CM | POA: Diagnosis not present

## 2023-08-19 DIAGNOSIS — R300 Dysuria: Secondary | ICD-10-CM

## 2023-08-19 DIAGNOSIS — Z780 Asymptomatic menopausal state: Secondary | ICD-10-CM

## 2023-08-19 DIAGNOSIS — M25561 Pain in right knee: Secondary | ICD-10-CM | POA: Diagnosis not present

## 2023-08-19 DIAGNOSIS — R6889 Other general symptoms and signs: Secondary | ICD-10-CM | POA: Diagnosis not present

## 2023-08-19 DIAGNOSIS — R5381 Other malaise: Secondary | ICD-10-CM | POA: Diagnosis not present

## 2023-08-19 LAB — URINALYSIS, ROUTINE W REFLEX MICROSCOPIC
Bilirubin, UA: NEGATIVE
Glucose, UA: NEGATIVE
Ketones, UA: NEGATIVE
Leukocytes,UA: NEGATIVE
Nitrite, UA: NEGATIVE
Protein,UA: NEGATIVE
Specific Gravity, UA: 1.01 (ref 1.005–1.030)
Urobilinogen, Ur: 0.2 mg/dL (ref 0.2–1.0)
pH, UA: 7 (ref 5.0–7.5)

## 2023-08-19 LAB — MICROSCOPIC EXAMINATION
Bacteria, UA: NONE SEEN
Renal Epithel, UA: NONE SEEN /HPF
Yeast, UA: NONE SEEN

## 2023-08-19 MED ORDER — TRAMADOL HCL 50 MG PO TABS
50.0000 mg | ORAL_TABLET | Freq: Two times a day (BID) | ORAL | 0 refills | Status: DC
Start: 2023-09-18 — End: 2023-11-22

## 2023-08-19 MED ORDER — TRAMADOL HCL 50 MG PO TABS
50.0000 mg | ORAL_TABLET | Freq: Two times a day (BID) | ORAL | 0 refills | Status: DC
Start: 2023-10-18 — End: 2024-02-17

## 2023-08-19 MED ORDER — METHOCARBAMOL 500 MG PO TABS: 500.0000 mg | ORAL_TABLET | Freq: Three times a day (TID) | ORAL | 5 refills | Status: DC | PRN

## 2023-08-19 MED ORDER — TRAMADOL HCL 50 MG PO TABS: 50.0000 mg | ORAL_TABLET | Freq: Two times a day (BID) | ORAL | 0 refills | Status: AC | PRN

## 2023-08-19 NOTE — Progress Notes (Signed)
Subjective:  Patient ID: Sherry Salinas, female    DOB: 1956-04-17, 67 y.o.   MRN: [id number]  Patient Care Team: Sonny Masters, FNP as PCP - General (Family Medicine) Jena Gauss Gerrit Friends, MD as Consulting Physician (Gastroenterology) Danella Maiers, [facility] as [facility] Care Management (Pharmacist)   Chief Complaint:  Pain Management (3 month follow up ) and Dysuria (X 1 week )   HPI: Sherry Salinas is a 67 y.o. female presenting on 08/19/2023 for Pain Management (3 month follow up ) and Dysuria (X 1 week )   1. Chronic left shoulder pain 2. Chronic right hip pain 3. Chronic pain of right knee Pain assessment: Cause of pain-chronic multiple joint pain Pain location-knees, hips, shoulders Pain on scale of 1-10-4-8 out of 10 Frequency-daily What increases pain-laying down and activities What makes pain Better-some activities, medication, rest Effects on ADL -minimal Any change in general medical condition-no  Current opioids rx-tramadol 50 mg # meds rx-60 Effectiveness of current meds-good Adverse reactions from pain meds-none Morphine equivalent-10 MME/day  Pill count performed-no Last drug screen - 01/2023 ( high risk q4m, moderate risk q45m, low risk yearly ) Urine drug screen today-no Was the NCCSR reviewed-yes  If yes were their any concerning findings? -No   Overdose risk: low    01/13/2023    9:46 PM  Opioid Risk   Alcohol 0  Illegal Drugs 0  Rx Drugs 0  Alcohol 0  Illegal Drugs 0  Rx Drugs 0  Age between 16-45 years  0  History of Preadolescent Sexual Abuse 0  Psychological Disease 2  ADD Negative  OCD Negative  Bipolar Negative  Depression 0  Opioid Risk Tool Scoring 2  Opioid Risk Interpretation Low Risk     4. Dysuria Patient reports over the last several days she has had some dysuria.  This is intermittent.  No hematuria, abdominal pain, flank pain, confusion, weakness, chills, fever, or malaise.  Has not tried anything for  symptoms.  5. Malaise and fatigue 6. Myalgia Patient reports over the last several weeks she has had some muscle aches and pains along with malaise and fatigue.  States this started after starting her statin therapy.  Nothing seems to make this improve.  7. Essential hypertension Patient is on hydrochlorothiazide, amlodipine, and Toprol-XL.  States she is doing well on these medications without any adverse side effects.  No headaches, chest pain, leg swelling, weakness, confusion, visual changes, or syncope.  No changes in urine output.  8. Aortic atherosclerosis (HCC) Noted on imaging.  No anginal symptoms.  She is on aspirin and statin therapy.     Relevant past medical, surgical, family, and social history reviewed and updated as indicated.  Allergies and medications reviewed and updated. Data reviewed: Chart in Epic.   Past Medical History:  Diagnosis Date   Anxiety    Aortic atherosclerosis (HCC)    Arthritis    Hyperlipidemia    Hypertension    IBS (irritable bowel syndrome)    Osteopenia 01/08/2021   Prediabetes 01/18/2022   Vitamin D deficiency 09/14/2017    Past Surgical History:  Procedure Laterality Date   COLONOSCOPY  2015   Dr. Madilyn Fireman: per pt, diverticulosis, 3 polyps, come back in five years.    COLONOSCOPY WITH ESOPHAGOGASTRODUODENOSCOPY (EGD)  02/2003   Dr. Madilyn Fireman: Grade 3 esophagitis, hiatal hernia with patulous GE junction, mild duodenitis.  CLOtest, results unavailable.  Colonoscopy normal.   COLONOSCOPY WITH PROPOFOL N/A 06/23/2021  Procedure: COLONOSCOPY WITH PROPOFOL;  Surgeon: Lanelle Bal, DO;  [facility]: [facility];  Service: Endoscopy;  Laterality: N/A;  10:30am   POLYPECTOMY  06/23/2021   Procedure: POLYPECTOMY INTESTINAL;  Surgeon: Lanelle Bal, DO;  Location: [facility];  Service: Endoscopy;;   REPLACEMENT TOTAL KNEE Right 2012   ROTATOR CUFF REPAIR Right     Social History   Socioeconomic History   Marital status: Married     Spouse name: Not on file   Number of children: 2   Years of education: Not on file   Highest education level: Associate degree: occupational, Scientist, product/process development, or vocational program  Occupational History   Occupation: Retired    Comment: Previously EMT, FF, and RN  Tobacco Use   Smoking status: Every Day    Current packs/day: 1.00    Average packs/day: 1 pack/day for 30.0 years (30.0 ttl pk-yrs)    Types: Cigarettes   Smokeless tobacco: Never  Vaping Use   Vaping status: Never Used  Substance and Sexual Activity   Alcohol use: Never   Drug use: Never   Sexual activity: Not on file  Other Topics Concern   Not on file  Social History Narrative   Lives with husband, son and granddaughter    Social Determinants of Health   Financial Resource Strain: Low Risk  (03/29/2023)   Overall Financial Resource Strain (CARDIA)    Difficulty of Paying Living Expenses: Not very hard  Food Insecurity: No Food Insecurity (03/29/2023)   Hunger Vital Sign    Worried About Running Out of Food in the Last Year: Never true    Ran Out of Food in the Last Year: Never true  Transportation Needs: Unmet Transportation Needs (03/29/2023)   PRAPARE - Administrator, Civil Service (Medical): Yes    Lack of Transportation (Non-Medical): No  Physical Activity: Sufficiently Active (03/29/2023)   Exercise Vital Sign    Days of Exercise per Week: 5 days    Minutes of Exercise per Session: 130 min  Stress: Stress Concern Present (03/29/2023)   [employer] of Occupational Health - Occupational Stress Questionnaire    Feeling of Stress : To some extent  Social Connections: Socially Integrated (03/29/2023)   Social Connection and Isolation Panel [NHANES]    Frequency of Communication with Friends and Family: More than three times a week    Frequency of Social Gatherings with Friends and Family: Twice a week    Attends Religious Services: More than 4 times per year    Active Member of [employer] or  Organizations: Yes    Attends Banker Meetings: 1 to 4 times per year    Marital Status: Married  Catering manager Violence: Not At Risk (02/04/2023)   Humiliation, Afraid, Rape, and Kick questionnaire    Fear of Current or Ex-Partner: No    Emotionally Abused: No    Physically Abused: No    Sexually Abused: No    Outpatient Encounter Medications as of 08/19/2023  Medication Sig   amLODipine (NORVASC) 5 MG tablet TAKE 1 TABLET EVERY DAY   aspirin EC 81 MG tablet Take 81 mg by mouth daily. Swallow whole.   busPIRone (BUSPAR) 7.5 MG tablet TAKE 1 TABLET THREE TIMES DAILY   Calcium-Magnesium-Zinc (CAL-MAG-ZINC PO) Take 1 tablet by mouth daily.   celecoxib (CELEBREX) 100 MG capsule Take 1 capsule (100 mg total) by mouth 2 (two) times daily.   dicyclomine (BENTYL) 20 MG tablet Take 1 tablet (20 mg  total) by mouth 3 (three) times daily.   EPINEPHrine 0.3 mg/0.3 mL IJ SOAJ injection Inject 0.3 mg into the muscle as needed for anaphylaxis.   ezetimibe (ZETIA) 10 MG tablet Take 1 tablet (10 mg total) by mouth daily.   hydrochlorothiazide (HYDRODIURIL) 25 MG tablet TAKE 1 TABLET (25 MG TOTAL) BY MOUTH DAILY.   levocetirizine (XYZAL) 5 MG tablet Take 1 tablet (5 mg total) by mouth every evening.   metoprolol succinate (TOPROL-XL) 50 MG 24 hr tablet TAKE 1 TABLET (50 MG TOTAL) BY MOUTH DAILY. TAKE WITH OR IMMEDIATELY FOLLOWING A MEAL.   mupirocin ointment (BACTROBAN) 2 % APPLY 1 APPLICATION TOPICALLY 2 (TWO) TIMES DAILY.   ondansetron (ZOFRAN) 4 MG tablet Take 1 tablet (4 mg total) by mouth every 8 (eight) hours as needed for nausea or vomiting.   pantoprazole (PROTONIX) 40 MG tablet TAKE 1 TABLET (40 MG) BY MOUTH DAILY 30 MINUTES BEFORE BREAKFAST   pravastatin (PRAVACHOL) 10 MG tablet Take 1 tablet (10 mg total) by mouth daily.   prednisoLONE acetate (PRED FORTE) 1 % ophthalmic suspension Place 1 drop into both eyes every 4 (four) hours.   [START ON 09/18/2023] traMADol (ULTRAM) 50 MG  tablet Take 1 tablet (50 mg total) by mouth 2 (two) times daily.   [START ON 10/18/2023] traMADol (ULTRAM) 50 MG tablet Take 1 tablet (50 mg total) by mouth 2 (two) times daily.   triamcinolone cream (KENALOG) 0.1 % APPLY TOPICALLY TWO TIMES DAILY AS NEEDED   [DISCONTINUED] methocarbamol (ROBAXIN) 500 MG tablet Take 1 tablet (500 mg total) by mouth every 8 (eight) hours as needed for muscle spasms.   [DISCONTINUED] traMADol (ULTRAM) 50 MG tablet Take 1 tablet (50 mg total) by mouth 2 (two) times daily as needed.   methocarbamol (ROBAXIN) 500 MG tablet Take 1 tablet (500 mg total) by mouth every 8 (eight) hours as needed for muscle spasms.   traMADol (ULTRAM) 50 MG tablet Take 1 tablet (50 mg total) by mouth 2 (two) times daily as needed.   No facility-administered encounter medications on file as of 08/19/2023.    Allergies  Allergen Reactions   Bee Venom Anaphylaxis   Codeine Anaphylaxis   Statins Other (See Comments)    Muscle aches   Sulfa Antibiotics Rash   Sulfasalazine Rash   Anusol-Hc [Hydrocortisone]     Rash/itching, all hemorrhoid creams    Review of Systems  Constitutional:  Positive for activity change, appetite change and fatigue. Negative for chills, diaphoresis, fever and unexpected weight change.  HENT: Negative.    Eyes: Negative.  Negative for photophobia and visual disturbance.  Respiratory:  Negative for cough, chest tightness and shortness of breath.   Cardiovascular:  Negative for chest pain, palpitations and leg swelling.  Gastrointestinal:  Negative for abdominal pain, blood in stool, constipation, diarrhea, nausea and vomiting.  Endocrine: Negative.  Negative for cold intolerance, heat intolerance, polydipsia, polyphagia and polyuria.  Genitourinary:  Positive for dysuria. Negative for decreased urine volume, difficulty urinating, enuresis, flank pain, frequency, genital sores, hematuria, menstrual problem, pelvic pain, urgency, vaginal bleeding, vaginal  discharge and vaginal pain.  Musculoskeletal:  Positive for arthralgias, back pain and myalgias.  Skin: Negative.   Allergic/Immunologic: Negative.   Neurological:  Negative for dizziness, tremors, seizures, syncope, facial asymmetry, speech difficulty, weakness, light-headedness, numbness and headaches.  Hematological: Negative.   Psychiatric/Behavioral:  Negative for confusion, hallucinations, sleep disturbance and suicidal ideas.   All other systems reviewed and are negative.  Objective:  BP 136/68   Pulse 97   Temp 97.8 F (36.6 C) (Temporal)   Ht 5\' 2"  (1.575 m)   Wt 145 lb 12.8 oz (66.1 kg)   SpO2 100%   BMI 26.67 kg/m    Wt Readings from Last 3 Encounters:  08/19/23 145 lb 12.8 oz (66.1 kg)  06/27/23 148 lb 3.2 oz (67.2 kg)  05/13/23 145 lb 3.2 oz (65.9 kg)    Physical Exam Vitals and nursing note reviewed.  Constitutional:      Appearance: Normal appearance.  HENT:     Head: Normocephalic and atraumatic.     Mouth/Throat:     Mouth: Mucous membranes are moist.     Pharynx: Oropharynx is clear.  Eyes:     Conjunctiva/sclera: Conjunctivae normal.     Pupils: Pupils are equal, round, and reactive to light.  Cardiovascular:     Rate and Rhythm: Normal rate and regular rhythm.     Heart sounds: Normal heart sounds.  Pulmonary:     Effort: Pulmonary effort is normal.     Breath sounds: Normal breath sounds.  Musculoskeletal:     Cervical back: Normal range of motion and neck supple.     Right lower leg: No edema.     Left lower leg: No edema.     Comments: Decreased range of motion in shoulders, hips, and knees.  No deformities noted.  No crepitus or tenderness.  Gait normal.  Skin:    General: Skin is warm and dry.     Capillary Refill: Capillary refill takes less than 2 seconds.  Neurological:     General: No focal deficit present.     Mental Status: She is alert and oriented to person, place, and time.     Gait: Gait normal.  Psychiatric:         Mood and Affect: Mood normal.        Behavior: Behavior normal.        Thought Content: Thought content normal.        Judgment: Judgment normal.     Results for orders placed or performed in visit on 08/19/23  Microscopic Examination   Urine  Result Value Ref Range   WBC, UA 0-5 0 - 5 /hpf   RBC, Urine 0-2 0 - 2 /hpf   Epithelial Cells (non renal) 0-10 0 - 10 /hpf   Renal Epithel, UA None seen None seen /hpf   Bacteria, UA None seen None seen/Few   Yeast, UA None seen None seen  Urinalysis, Routine w reflex microscopic  Result Value Ref Range   Specific Gravity, UA 1.010 1.005 - 1.030   pH, UA 7.0 5.0 - 7.5   Color, UA Yellow Yellow   Appearance Ur Clear Clear   Leukocytes,UA Negative Negative   Protein,UA Negative Negative/Trace   Glucose, UA Negative Negative   Ketones, UA Negative Negative   RBC, UA Trace (A) Negative   Bilirubin, UA Negative Negative   Urobilinogen, Ur 0.2 0.2 - 1.0 mg/dL   Nitrite, UA Negative Negative   Microscopic Examination See below:   CMP14+EGFR  Result Value Ref Range   Glucose WILL FOLLOW    BUN WILL FOLLOW    Creatinine, Ser WILL FOLLOW    eGFR WILL FOLLOW    BUN/Creatinine Ratio WILL FOLLOW    Sodium WILL FOLLOW    Potassium WILL FOLLOW    Chloride WILL FOLLOW    CO2 WILL FOLLOW    Calcium WILL FOLLOW  Total Protein WILL FOLLOW    Albumin WILL FOLLOW    Globulin, Total WILL FOLLOW    Bilirubin Total WILL FOLLOW    Alkaline Phosphatase WILL FOLLOW    AST WILL FOLLOW    ALT WILL FOLLOW   CBC with Differential/Platelet  Result Value Ref Range   WBC 8.3 3.4 - 10.8 x10E3/uL   RBC 4.39 3.77 - 5.28 x10E6/uL   Hemoglobin 14.6 11.1 - 15.9 g/dL   Hematocrit 40.9 81.1 - 46.6 %   MCV 101 (H) 79 - 97 fL   MCH 33.3 (H) 26.6 - 33.0 pg   MCHC 33.1 31.5 - 35.7 g/dL   RDW 91.4 (L) 78.2 - 95.6 %   Platelets 287 150 - 450 x10E3/uL   Neutrophils 51 Not Estab. %   Lymphs 33 Not Estab. %   Monocytes 13 Not Estab. %   Eos 2 Not Estab. %    Basos 1 Not Estab. %   Neutrophils Absolute 4.2 1.4 - 7.0 x10E3/uL   Lymphocytes Absolute 2.8 0.7 - 3.1 x10E3/uL   Monocytes Absolute 1.1 (H) 0.1 - 0.9 x10E3/uL   EOS (ABSOLUTE) 0.2 0.0 - 0.4 x10E3/uL   Basophils Absolute 0.1 0.0 - 0.2 x10E3/uL   Immature Granulocytes 0 Not Estab. %   Immature Grans (Abs) 0.0 0.0 - 0.1 x10E3/uL  Thyroid Panel With TSH  Result Value Ref Range   TSH WILL FOLLOW    T4, Total WILL FOLLOW    T3 Uptake Ratio WILL FOLLOW    Free Thyroxine Index WILL FOLLOW   VITAMIN D 25 Hydroxy (Vit-D Deficiency, Fractures)  Result Value Ref Range   Vit D, 25-Hydroxy WILL FOLLOW   Vitamin B12  Result Value Ref Range   Vitamin B-12 WILL FOLLOW   CK total and CKMB (cardiac)not at [facility]  Result Value Ref Range   Total CK WILL FOLLOW    CK-MB Index WILL FOLLOW   Lipid panel  Result Value Ref Range   Cholesterol, Total WILL FOLLOW    Triglycerides WILL FOLLOW    HDL WILL FOLLOW    VLDL Cholesterol Cal WILL FOLLOW    LDL Chol Calc (NIH) WILL FOLLOW    LDL CALC COMMENT: WILL FOLLOW    Chol/HDL Ratio WILL FOLLOW        Pertinent labs & imaging results that were available during my care of the patient were reviewed by me and considered in my medical decision making.  Assessment & Plan:  Margaretha was seen today for pain management and dysuria.  Diagnoses and all orders for this visit:  Chronic left shoulder pain Chronic right hip pain Chronic pain of right knee PDMP reviewed and no red flags present.  Controlled substance agreement and urine drug screen are up-to-date.  Will continue below.  Sedation precautions provided. -     methocarbamol (ROBAXIN) 500 MG tablet; Take 1 tablet (500 mg total) by mouth every 8 (eight) hours as needed for muscle spasms. -     traMADol (ULTRAM) 50 MG tablet; Take 1 tablet (50 mg total) by mouth 2 (two) times daily as needed. -     traMADol (ULTRAM) 50 MG tablet; Take 1 tablet (50 mg total) by mouth 2 (two) times daily. -     traMADol  (ULTRAM) 50 MG tablet; Take 1 tablet (50 mg total) by mouth 2 (two) times daily.  Dysuria We will check urinalysis and culture.  Treatment pending results. -     Urinalysis, Routine w reflex microscopic -  Urine Culture  Malaise and fatigue Ongoing and worsening over the last several weeks.  Will check for potential rhabdo from statin therapy as it did start after initiation of statin therapy.  Will also check for potential vitamin, mineral, or electrolyte deficiencies.  Will check for possible underlying infection which could be causative.  Will also check thyroid function, vitamin D, and vitamin B12. -     CMP14+EGFR -     CBC with Differential/Platelet -     Thyroid Panel With TSH -     VITAMIN D 25 Hydroxy (Vit-D Deficiency, Fractures) -     Vitamin B12 -     CK total and CKMB (cardiac)not at [facility] -     Lipid panel -     Urinalysis, Routine w reflex microscopic  Myalgia As above, will check for potential underlying causes. -     CMP14+EGFR -     CBC with Differential/Platelet -     Thyroid Panel With TSH -     VITAMIN D 25 Hydroxy (Vit-D Deficiency, Fractures) -     Vitamin B12 -     CK total and CKMB (cardiac)not at [facility] -     Lipid panel  Essential hypertension BP well controlled. Changes were not made in regimen today. Goal BP is 130/80. Pt aware to report any persistent high or low readings. DASH diet and exercise encouraged. Exercise at least 150 minutes per week and increase as tolerated. Goal BMI > 25. Stress management encouraged. Avoid nicotine and tobacco product use. Avoid excessive alcohol and NSAID's. Avoid more than 2000 mg of sodium daily. Medications as prescribed. Follow up as scheduled.  -     CMP14+EGFR -     CBC with Differential/Platelet -     Thyroid Panel With TSH -     Lipid panel  Aortic atherosclerosis (HCC) Continue aspirin and statin therapy. -     Lipid panel       Continue all other maintenance medications.  Follow up plan: Return in  3 months (on 11/18/2023), or if symptoms worsen or fail to improve, for CPE.   Continue healthy lifestyle choices, including diet (rich in fruits, vegetables, and lean proteins, and low in salt and simple carbohydrates) and exercise (at least 30 minutes of moderate physical activity daily).    The above assessment and management plan was discussed with the patient. The patient verbalized understanding of and has agreed to the management plan. Patient is aware to call the clinic if they develop any new symptoms or if symptoms persist or worsen. Patient is aware when to return to the clinic for a follow-up visit. Patient educated on when it is appropriate to go to the emergency department.   Kari Baars, FNP-C [facility] [phone]

## 2023-08-20 LAB — CBC WITH DIFFERENTIAL/PLATELET
Basophils Absolute: 0.1 10*3/uL (ref 0.0–0.2)
Basos: 1 %
EOS (ABSOLUTE): 0.2 10*3/uL (ref 0.0–0.4)
Eos: 2 %
Hematocrit: 44.1 % (ref 34.0–46.6)
Hemoglobin: 14.6 g/dL (ref 11.1–15.9)
Immature Grans (Abs): 0 10*3/uL (ref 0.0–0.1)
Immature Granulocytes: 0 %
Lymphocytes Absolute: 2.8 10*3/uL (ref 0.7–3.1)
Lymphs: 33 %
MCH: 33.3 pg — ABNORMAL HIGH (ref 26.6–33.0)
MCHC: 33.1 g/dL (ref 31.5–35.7)
MCV: 101 fL — ABNORMAL HIGH (ref 79–97)
Monocytes Absolute: 1.1 10*3/uL — ABNORMAL HIGH (ref 0.1–0.9)
Monocytes: 13 %
Neutrophils Absolute: 4.2 10*3/uL (ref 1.4–7.0)
Neutrophils: 51 %
Platelets: 287 10*3/uL (ref 150–450)
RBC: 4.39 x10E6/uL (ref 3.77–5.28)
RDW: 11.6 % — ABNORMAL LOW (ref 11.7–15.4)
WBC: 8.3 10*3/uL (ref 3.4–10.8)

## 2023-08-20 LAB — CMP14+EGFR
ALT: 25 IU/L (ref 0–32)
AST: 20 IU/L (ref 0–40)
Albumin: 4.1 g/dL (ref 3.9–4.9)
Alkaline Phosphatase: 69 IU/L (ref 44–121)
BUN/Creatinine Ratio: 10 — ABNORMAL LOW (ref 12–28)
BUN: 8 mg/dL (ref 8–27)
Bilirubin Total: 0.3 mg/dL (ref 0.0–1.2)
CO2: 24 mmol/L (ref 20–29)
Calcium: 10.1 mg/dL (ref 8.7–10.3)
Chloride: 102 mmol/L (ref 96–106)
Creatinine, Ser: 0.83 mg/dL (ref 0.57–1.00)
Globulin, Total: 2.4 g/dL (ref 1.5–4.5)
Glucose: 86 mg/dL (ref 70–99)
Potassium: 4 mmol/L (ref 3.5–5.2)
Sodium: 140 mmol/L (ref 134–144)
Total Protein: 6.5 g/dL (ref 6.0–8.5)
eGFR: 77 mL/min/{1.73_m2} (ref >59)

## 2023-08-20 LAB — LIPID PANEL
Chol/HDL Ratio: 4.7 ratio — ABNORMAL HIGH (ref 0.0–4.4)
Cholesterol, Total: 197 mg/dL (ref 100–199)
HDL: 42 mg/dL (ref >39)
LDL Chol Calc (NIH): 122 mg/dL — ABNORMAL HIGH (ref 0–99)
Triglycerides: 185 mg/dL — ABNORMAL HIGH (ref 0–149)
VLDL Cholesterol Cal: 33 mg/dL (ref 5–40)

## 2023-08-20 LAB — THYROID PANEL WITH TSH
Free Thyroxine Index: 1.7 (ref 1.2–4.9)
T3 Uptake Ratio: 25 % (ref 24–39)
T4, Total: 6.9 ug/dL (ref 4.5–12.0)
TSH: 1.41 u[IU]/mL (ref 0.450–4.500)

## 2023-08-20 LAB — VITAMIN D 25 HYDROXY (VIT D DEFICIENCY, FRACTURES): Vit D, 25-Hydroxy: 26.9 ng/mL — ABNORMAL LOW (ref 30.0–100.0)

## 2023-08-20 LAB — VITAMIN B12: Vitamin B-12: 231 pg/mL — ABNORMAL LOW (ref 232–1245)

## 2023-08-20 LAB — CK TOTAL AND CKMB (NOT AT ARMC)
CK-MB Index: 1.1 ng/mL (ref 0.0–5.3)
Total CK: 54 U/L (ref 32–182)

## 2023-08-22 LAB — URINE CULTURE

## 2023-08-27 ENCOUNTER — Other Ambulatory Visit: Payer: Self-pay | Admitting: Family Medicine

## 2023-08-27 DIAGNOSIS — I10 Essential (primary) hypertension: Secondary | ICD-10-CM

## 2023-08-27 DIAGNOSIS — F411 Generalized anxiety disorder: Secondary | ICD-10-CM

## 2023-09-08 ENCOUNTER — Encounter: Payer: Self-pay | Admitting: Family Medicine

## 2023-09-08 ENCOUNTER — Ambulatory Visit (INDEPENDENT_AMBULATORY_CARE_PROVIDER_SITE_OTHER): Payer: Medicare HMO | Admitting: Family Medicine

## 2023-09-08 VITALS — BP 123/70 | HR 77 | Temp 97.7°F | Ht 62.0 in | Wt 146.4 lb

## 2023-09-08 DIAGNOSIS — N3001 Acute cystitis with hematuria: Secondary | ICD-10-CM

## 2023-09-08 DIAGNOSIS — R110 Nausea: Secondary | ICD-10-CM | POA: Diagnosis not present

## 2023-09-08 DIAGNOSIS — K5792 Diverticulitis of intestine, part unspecified, without perforation or abscess without bleeding: Secondary | ICD-10-CM | POA: Diagnosis not present

## 2023-09-08 DIAGNOSIS — R300 Dysuria: Secondary | ICD-10-CM | POA: Diagnosis not present

## 2023-09-08 LAB — URINALYSIS, ROUTINE W REFLEX MICROSCOPIC
Bilirubin, UA: NEGATIVE
Glucose, UA: NEGATIVE
Ketones, UA: NEGATIVE
Nitrite, UA: NEGATIVE
Protein,UA: NEGATIVE
Specific Gravity, UA: 1.02 (ref 1.005–1.030)
Urobilinogen, Ur: 0.2 mg/dL (ref 0.2–1.0)
pH, UA: 7 (ref 5.0–7.5)

## 2023-09-08 LAB — MICROSCOPIC EXAMINATION
Renal Epithel, UA: NONE SEEN /[HPF]
Yeast, UA: NONE SEEN

## 2023-09-08 MED ORDER — AMOXICILLIN-POT CLAVULANATE 875-125 MG PO TABS
1.0000 | ORAL_TABLET | Freq: Two times a day (BID) | ORAL | 0 refills | Status: DC
Start: 2023-09-08 — End: 2023-09-29

## 2023-09-08 MED ORDER — ONDANSETRON HCL 4 MG PO TABS
4.0000 mg | ORAL_TABLET | Freq: Three times a day (TID) | ORAL | 1 refills | Status: DC | PRN
Start: 2023-09-08 — End: 2024-06-27

## 2023-09-08 NOTE — Progress Notes (Signed)
Subjective:  Patient ID: Sherry Salinas, female    DOB: 12/26/1955, 67 y.o.   MRN: [id number]  Patient Care Team: Sonny Masters, FNP as PCP - General (Family Medicine) Jena Gauss, Gerrit Friends, MD as Consulting Physician (Gastroenterology) Danella Maiers, [facility] as [facility] Care Management (Pharmacist)   Chief Complaint:  Dysuria (Has been going on since 9/13 & states it is no better. ) and Hematuria (Noticed yesterday )   HPI: Sherry Salinas is a 67 y.o. female presenting on 09/08/2023 for Dysuria (Has been going on since 9/13 & states it is no better. ) and Hematuria (Noticed yesterday )   Dysuria  This is a recurrent problem. The problem occurs every urination. The problem has been waxing and waning. The quality of the pain is described as aching and burning. The pain is mild. There has been no fever. She is Not sexually active. There is No history of pyelonephritis. Associated symptoms include chills, frequency, hematuria, nausea and urgency. Pertinent negatives include no discharge, flank pain, possible pregnancy or vomiting. She has tried increased fluids for the symptoms. The treatment provided no relief. diverticulosis  Abdominal Pain This is a new problem. The current episode started in the past 7 days. The problem has been waxing and waning. The pain is located in the LLQ. The pain is moderate. The quality of the pain is aching and cramping. Associated symptoms include diarrhea, dysuria, a fever, frequency, hematuria and nausea. Pertinent negatives include no constipation or vomiting. The pain is aggravated by bowel movement. The pain is relieved by Nothing. diverticulosis     Relevant past medical, surgical, family, and social history reviewed and updated as indicated.  Allergies and medications reviewed and updated. Data reviewed: Chart in Epic.   Past Medical History:  Diagnosis Date   Anxiety    Aortic atherosclerosis (HCC)    Arthritis    Hyperlipidemia     Hypertension    IBS (irritable bowel syndrome)    Osteopenia 01/08/2021   Prediabetes 01/18/2022   Vitamin D deficiency 09/14/2017    Past Surgical History:  Procedure Laterality Date   COLONOSCOPY  2015   Dr. Madilyn Fireman: per pt, diverticulosis, 3 polyps, come back in five years.    COLONOSCOPY WITH ESOPHAGOGASTRODUODENOSCOPY (EGD)  02/2003   Dr. Madilyn Fireman: Grade 3 esophagitis, hiatal hernia with patulous GE junction, mild duodenitis.  CLOtest, results unavailable.  Colonoscopy normal.   COLONOSCOPY WITH PROPOFOL N/A 06/23/2021   Procedure: COLONOSCOPY WITH PROPOFOL;  Surgeon: Lanelle Bal, DO;  [facility]: [facility];  Service: Endoscopy;  Laterality: N/A;  10:30am   POLYPECTOMY  06/23/2021   Procedure: POLYPECTOMY INTESTINAL;  Surgeon: Lanelle Bal, DO;  Location: [facility];  Service: Endoscopy;;   REPLACEMENT TOTAL KNEE Right 2012   ROTATOR CUFF REPAIR Right     Social History   Socioeconomic History   Marital status: Married    Spouse name: Not on file   Number of children: 2   Years of education: Not on file   Highest education level: Associate degree: occupational, Scientist, product/process development, or vocational program  Occupational History   Occupation: Retired    Comment: Previously EMT, FF, and RN  Tobacco Use   Smoking status: Every Day    Current packs/day: 1.00    Average packs/day: 1 pack/day for 30.0 years (30.0 ttl pk-yrs)    Types: Cigarettes   Smokeless tobacco: Never  Vaping Use   Vaping status: Never Used  Substance and  Sexual Activity   Alcohol use: Never   Drug use: Never   Sexual activity: Not on file  Other Topics Concern   Not on file  Social History Narrative   Lives with husband, son and granddaughter    Social Determinants of Health   Financial Resource Strain: Low Risk  (03/29/2023)   Overall Financial Resource Strain (CARDIA)    Difficulty of Paying Living Expenses: Not very hard  Food Insecurity: No Food Insecurity (03/29/2023)   Hunger Vital Sign     Worried About Running Out of Food in the Last Year: Never true    Ran Out of Food in the Last Year: Never true  Transportation Needs: Unmet Transportation Needs (03/29/2023)   PRAPARE - Administrator, Civil Service (Medical): Yes    Lack of Transportation (Non-Medical): No  Physical Activity: Sufficiently Active (03/29/2023)   Exercise Vital Sign    Days of Exercise per Week: 5 days    Minutes of Exercise per Session: 130 min  Stress: Stress Concern Present (03/29/2023)   [employer] of Occupational Health - Occupational Stress Questionnaire    Feeling of Stress : To some extent  Social Connections: Socially Integrated (03/29/2023)   Social Connection and Isolation Panel [NHANES]    Frequency of Communication with Friends and Family: More than three times a week    Frequency of Social Gatherings with Friends and Family: Twice a week    Attends Religious Services: More than 4 times per year    Active Member of [employer] or Organizations: Yes    Attends Banker Meetings: 1 to 4 times per year    Marital Status: Married  Catering manager Violence: Not At Risk (02/04/2023)   Humiliation, Afraid, Rape, and Kick questionnaire    Fear of Current or Ex-Partner: No    Emotionally Abused: No    Physically Abused: No    Sexually Abused: No    Outpatient Encounter Medications as of 09/08/2023  Medication Sig   amLODipine (NORVASC) 5 MG tablet TAKE 1 TABLET EVERY DAY   amoxicillin-clavulanate (AUGMENTIN) 875-125 MG tablet Take 1 tablet by mouth 2 (two) times daily.   aspirin EC 81 MG tablet Take 81 mg by mouth daily. Swallow whole.   busPIRone (BUSPAR) 7.5 MG tablet TAKE 1 TABLET THREE TIMES DAILY   Calcium-Magnesium-Zinc (CAL-MAG-ZINC PO) Take 1 tablet by mouth daily.   celecoxib (CELEBREX) 100 MG capsule Take 1 capsule (100 mg total) by mouth 2 (two) times daily.   dicyclomine (BENTYL) 20 MG tablet Take 1 tablet (20 mg total) by mouth 3 (three) times daily.    EPINEPHrine 0.3 mg/0.3 mL IJ SOAJ injection Inject 0.3 mg into the muscle as needed for anaphylaxis.   ezetimibe (ZETIA) 10 MG tablet Take 1 tablet (10 mg total) by mouth daily.   hydrochlorothiazide (HYDRODIURIL) 25 MG tablet TAKE 1 TABLET (25 MG TOTAL) BY MOUTH DAILY.   levocetirizine (XYZAL) 5 MG tablet Take 1 tablet (5 mg total) by mouth every evening.   methocarbamol (ROBAXIN) 500 MG tablet Take 1 tablet (500 mg total) by mouth every 8 (eight) hours as needed for muscle spasms.   metoprolol succinate (TOPROL-XL) 50 MG 24 hr tablet TAKE 1 TABLET (50 MG TOTAL) BY MOUTH DAILY. TAKE WITH OR IMMEDIATELY FOLLOWING A MEAL.   mupirocin ointment (BACTROBAN) 2 % APPLY 1 APPLICATION TOPICALLY 2 (TWO) TIMES DAILY.   pantoprazole (PROTONIX) 40 MG tablet TAKE 1 TABLET (40 MG) BY MOUTH DAILY 30 MINUTES BEFORE BREAKFAST  pravastatin (PRAVACHOL) 10 MG tablet Take 1 tablet (10 mg total) by mouth daily.   prednisoLONE acetate (PRED FORTE) 1 % ophthalmic suspension Place 1 drop into both eyes every 4 (four) hours.   traMADol (ULTRAM) 50 MG tablet Take 1 tablet (50 mg total) by mouth 2 (two) times daily as needed.   [START ON 09/18/2023] traMADol (ULTRAM) 50 MG tablet Take 1 tablet (50 mg total) by mouth 2 (two) times daily.   [START ON 10/18/2023] traMADol (ULTRAM) 50 MG tablet Take 1 tablet (50 mg total) by mouth 2 (two) times daily.   triamcinolone cream (KENALOG) 0.1 % APPLY TOPICALLY TWO TIMES DAILY AS NEEDED   [DISCONTINUED] ondansetron (ZOFRAN) 4 MG tablet Take 1 tablet (4 mg total) by mouth every 8 (eight) hours as needed for nausea or vomiting.   ondansetron (ZOFRAN) 4 MG tablet Take 1 tablet (4 mg total) by mouth every 8 (eight) hours as needed for nausea or vomiting.   No facility-administered encounter medications on file as of 09/08/2023.    Allergies  Allergen Reactions   Bee Venom Anaphylaxis   Codeine Anaphylaxis   Statins Other (See Comments)    Muscle aches   Sulfa Antibiotics Rash    Sulfasalazine Rash   Anusol-Hc [Hydrocortisone]     Rash/itching, all hemorrhoid creams    Review of Systems  Constitutional:  Positive for appetite change, chills and fever. Negative for activity change, diaphoresis, fatigue and unexpected weight change.  Eyes:  Negative for photophobia and visual disturbance.  Cardiovascular:  Negative for chest pain, palpitations and leg swelling.  Gastrointestinal:  Positive for abdominal pain, diarrhea and nausea. Negative for abdominal distention, anal bleeding, blood in stool, constipation, rectal pain and vomiting.  Genitourinary:  Positive for dysuria, frequency, hematuria and urgency. Negative for decreased urine volume, difficulty urinating, enuresis, flank pain, genital sores, pelvic pain, vaginal bleeding, vaginal discharge and vaginal pain.  Neurological:  Negative for dizziness and weakness.  Psychiatric/Behavioral:  Negative for confusion.   All other systems reviewed and are negative.       Objective:  BP 123/70   Pulse 77   Temp 97.7 F (36.5 C) (Temporal)   Ht 5\' 2"  (1.575 m)   Wt 146 lb 6.4 oz (66.4 kg)   SpO2 97%   BMI 26.78 kg/m    Wt Readings from Last 3 Encounters:  09/08/23 146 lb 6.4 oz (66.4 kg)  08/19/23 145 lb 12.8 oz (66.1 kg)  06/27/23 148 lb 3.2 oz (67.2 kg)    Physical Exam Vitals and nursing note reviewed.  Constitutional:      General: She is not in acute distress.    Appearance: Normal appearance. She is well-developed, well-groomed and normal weight. She is not ill-appearing, toxic-appearing or diaphoretic.  HENT:     Head: Normocephalic and atraumatic.     Jaw: There is normal jaw occlusion.     Right Ear: Hearing normal.     Left Ear: Hearing normal.     Nose: Nose normal.     Mouth/Throat:     Lips: Pink.     Mouth: Mucous membranes are moist.     Pharynx: Oropharynx is clear. Uvula midline.  Eyes:     General: Lids are normal.     Extraocular Movements: Extraocular movements intact.      Conjunctiva/sclera: Conjunctivae normal.     Pupils: Pupils are equal, round, and reactive to light.  Neck:     Thyroid: No thyroid mass, thyromegaly or thyroid tenderness.  Vascular: No carotid bruit or JVD.     Trachea: Trachea and phonation normal.  Cardiovascular:     Rate and Rhythm: Normal rate and regular rhythm.     Chest Wall: PMI is not displaced.     Pulses: Normal pulses.     Heart sounds: Normal heart sounds. No murmur heard.    No friction rub. No gallop.  Pulmonary:     Effort: Pulmonary effort is normal. No respiratory distress.     Breath sounds: Normal breath sounds. No wheezing.  Abdominal:     General: Bowel sounds are normal. There is no distension or abdominal bruit.     Palpations: Abdomen is soft. There is no hepatomegaly or splenomegaly.     Tenderness: There is abdominal tenderness in the left lower quadrant. There is no right CVA tenderness, left CVA tenderness, guarding or rebound.     Hernia: No hernia is present.  Musculoskeletal:        General: Normal range of motion.     Cervical back: Normal range of motion and neck supple.     Right lower leg: No edema.     Left lower leg: No edema.  Lymphadenopathy:     Cervical: No cervical adenopathy.  Skin:    General: Skin is warm and dry.     Capillary Refill: Capillary refill takes less than 2 seconds.     Coloration: Skin is not cyanotic, jaundiced or pale.     Findings: No rash.  Neurological:     General: No focal deficit present.     Mental Status: She is alert and oriented to person, place, and time.     Sensory: Sensation is intact.     Motor: Motor function is intact.     Coordination: Coordination is intact.     Gait: Gait is intact.     Deep Tendon Reflexes: Reflexes are normal and symmetric.  Psychiatric:        Attention and Perception: Attention and perception normal.        Mood and Affect: Mood and affect normal.        Speech: Speech normal.        Behavior: Behavior normal.  Behavior is cooperative.        Thought Content: Thought content normal.        Cognition and Memory: Cognition and memory normal.        Judgment: Judgment normal.     Results for orders placed or performed in visit on 08/19/23  Urine Culture   Specimen: Urine   UR  Result Value Ref Range   Urine Culture, Routine Final report    Organism ID, Bacteria Comment   Microscopic Examination   Urine  Result Value Ref Range   WBC, UA 0-5 0 - 5 /hpf   RBC, Urine 0-2 0 - 2 /hpf   Epithelial Cells (non renal) 0-10 0 - 10 /hpf   Renal Epithel, UA None seen None seen /hpf   Bacteria, UA None seen None seen/Few   Yeast, UA None seen None seen  Urinalysis, Routine w reflex microscopic  Result Value Ref Range   Specific Gravity, UA 1.010 1.005 - 1.030   pH, UA 7.0 5.0 - 7.5   Color, UA Yellow Yellow   Appearance Ur Clear Clear   Leukocytes,UA Negative Negative   Protein,UA Negative Negative/Trace   Glucose, UA Negative Negative   Ketones, UA Negative Negative   RBC, UA Trace (A) Negative   Bilirubin,  UA Negative Negative   Urobilinogen, Ur 0.2 0.2 - 1.0 mg/dL   Nitrite, UA Negative Negative   Microscopic Examination See below:   CMP14+EGFR  Result Value Ref Range   Glucose 86 70 - 99 mg/dL   BUN 8 8 - 27 mg/dL   Creatinine, Ser 4.74 0.57 - 1.00 mg/dL   eGFR 77 >25 ZD/GLO/7.56   BUN/Creatinine Ratio 10 (L) 12 - 28   Sodium 140 134 - 144 mmol/L   Potassium 4.0 3.5 - 5.2 mmol/L   Chloride 102 96 - 106 mmol/L   CO2 24 20 - 29 mmol/L   Calcium 10.1 8.7 - 10.3 mg/dL   Total Protein 6.5 6.0 - 8.5 g/dL   Albumin 4.1 3.9 - 4.9 g/dL   Globulin, Total 2.4 1.5 - 4.5 g/dL   Bilirubin Total 0.3 0.0 - 1.2 mg/dL   Alkaline Phosphatase 69 44 - 121 IU/L   AST 20 0 - 40 IU/L   ALT 25 0 - 32 IU/L  CBC with Differential/Platelet  Result Value Ref Range   WBC 8.3 3.4 - 10.8 x10E3/uL   RBC 4.39 3.77 - 5.28 x10E6/uL   Hemoglobin 14.6 11.1 - 15.9 g/dL   Hematocrit 43.3 29.5 - 46.6 %   MCV 101  (H) 79 - 97 fL   MCH 33.3 (H) 26.6 - 33.0 pg   MCHC 33.1 31.5 - 35.7 g/dL   RDW 18.8 (L) 41.6 - 60.6 %   Platelets 287 150 - 450 x10E3/uL   Neutrophils 51 Not Estab. %   Lymphs 33 Not Estab. %   Monocytes 13 Not Estab. %   Eos 2 Not Estab. %   Basos 1 Not Estab. %   Neutrophils Absolute 4.2 1.4 - 7.0 x10E3/uL   Lymphocytes Absolute 2.8 0.7 - 3.1 x10E3/uL   Monocytes Absolute 1.1 (H) 0.1 - 0.9 x10E3/uL   EOS (ABSOLUTE) 0.2 0.0 - 0.4 x10E3/uL   Basophils Absolute 0.1 0.0 - 0.2 x10E3/uL   Immature Granulocytes 0 Not Estab. %   Immature Grans (Abs) 0.0 0.0 - 0.1 x10E3/uL  Thyroid Panel With TSH  Result Value Ref Range   TSH 1.410 0.450 - 4.500 uIU/mL   T4, Total 6.9 4.5 - 12.0 ug/dL   T3 Uptake Ratio 25 24 - 39 %   Free Thyroxine Index 1.7 1.2 - 4.9  VITAMIN D 25 Hydroxy (Vit-D Deficiency, Fractures)  Result Value Ref Range   Vit D, 25-Hydroxy 26.9 (L) 30.0 - 100.0 ng/mL  Vitamin B12  Result Value Ref Range   Vitamin B-12 231 (L) 232 - 1,245 pg/mL  CK total and CKMB (cardiac)not at [facility]  Result Value Ref Range   Total CK 54 32 - 182 U/L   CK-MB Index 1.1 0.0 - 5.3 ng/mL  Lipid panel  Result Value Ref Range   Cholesterol, Total 197 100 - 199 mg/dL   Triglycerides 301 (H) 0 - 149 mg/dL   HDL 42 >60 mg/dL   VLDL Cholesterol Cal 33 5 - 40 mg/dL   LDL Chol Calc (NIH) 109 (H) 0 - 99 mg/dL   Chol/HDL Ratio 4.7 (H) 0.0 - 4.4 ratio       Pertinent labs & imaging results that were available during my care of the patient were reviewed by me and considered in my medical decision making.  Assessment & Plan:  Kaena was seen today for dysuria and hematuria.  Diagnoses and all orders for this visit:  Dysuria Urinalysis with trace blood and trace leukocytes,  few bacteria. Culture added.  -     Urinalysis, Routine w reflex microscopic -     Urine Culture  Acute cystitis with hematuria Will start below as pt has symptoms of diverticulitis as well. Culture pending, will change  medications if warranted. Increase water intake.  -     amoxicillin-clavulanate (AUGMENTIN) 875-125 MG tablet; Take 1 tablet by mouth 2 (two) times daily. -     Urine Culture  Nausea in adult Refill below as needed.  -     ondansetron (ZOFRAN) 4 MG tablet; Take 1 tablet (4 mg total) by mouth every 8 (eight) hours as needed for nausea or vomiting.  Diverticulitis Pt does not wish to have imaging or go to the ED. Red flags discussed in detail. Will treat with below to cover both cystitis and diverticulitis. Pt aware when to seek emergent evaluation and treatment.  -     amoxicillin-clavulanate (AUGMENTIN) 875-125 MG tablet; Take 1 tablet by mouth 2 (two) times daily.     Continue all other maintenance medications.  Follow up plan: Return if symptoms worsen or fail to improve.   Continue healthy lifestyle choices, including diet (rich in fruits, vegetables, and lean proteins, and low in salt and simple carbohydrates) and exercise (at least 30 minutes of moderate physical activity daily).  Educational handout given for diverticulitis   The above assessment and management plan was discussed with the patient. The patient verbalized understanding of and has agreed to the management plan. Patient is aware to call the clinic if they develop any new symptoms or if symptoms persist or worsen. Patient is aware when to return to the clinic for a follow-up visit. Patient educated on when it is appropriate to go to the emergency department.   Kari Baars, FNP-C [facility] [phone]

## 2023-09-10 ENCOUNTER — Other Ambulatory Visit: Payer: Self-pay | Admitting: Nurse Practitioner

## 2023-09-10 DIAGNOSIS — I10 Essential (primary) hypertension: Secondary | ICD-10-CM

## 2023-09-10 DIAGNOSIS — K219 Gastro-esophageal reflux disease without esophagitis: Secondary | ICD-10-CM

## 2023-09-10 LAB — URINE CULTURE

## 2023-09-12 ENCOUNTER — Encounter: Payer: Self-pay | Admitting: Family Medicine

## 2023-09-16 ENCOUNTER — Encounter: Payer: Self-pay | Admitting: Family Medicine

## 2023-09-16 MED ORDER — METRONIDAZOLE 500 MG PO TABS
500.0000 mg | ORAL_TABLET | Freq: Two times a day (BID) | ORAL | 0 refills | Status: DC
Start: 2023-09-16 — End: 2023-09-29

## 2023-09-16 NOTE — Telephone Encounter (Signed)
Pt says that she is not able to log on to mychart and would like a call back instead.

## 2023-09-16 NOTE — Telephone Encounter (Signed)
Flagyl added to regimen, please let pt know.

## 2023-09-16 NOTE — Addendum Note (Signed)
Addended by: Sonny Masters on: 09/16/2023 11:51 AM   Modules accepted: Orders

## 2023-09-23 ENCOUNTER — Encounter: Payer: Self-pay | Admitting: Family Medicine

## 2023-09-23 ENCOUNTER — Other Ambulatory Visit: Payer: Self-pay

## 2023-09-23 DIAGNOSIS — I700 Atherosclerosis of aorta: Secondary | ICD-10-CM

## 2023-09-23 DIAGNOSIS — J301 Allergic rhinitis due to pollen: Secondary | ICD-10-CM

## 2023-09-23 DIAGNOSIS — E782 Mixed hyperlipidemia: Secondary | ICD-10-CM

## 2023-09-23 MED ORDER — EZETIMIBE 10 MG PO TABS: 10.0000 mg | ORAL_TABLET | Freq: Every day | ORAL | 0 refills | Status: DC

## 2023-09-23 MED ORDER — PRAVASTATIN SODIUM 10 MG PO TABS: 10.0000 mg | ORAL_TABLET | Freq: Every day | ORAL | 0 refills | Status: DC

## 2023-09-23 MED ORDER — LEVOCETIRIZINE DIHYDROCHLORIDE 5 MG PO TABS
5.0000 mg | ORAL_TABLET | Freq: Every evening | ORAL | 0 refills | Status: DC
Start: 2023-09-23 — End: 2023-11-18

## 2023-09-29 ENCOUNTER — Ambulatory Visit (INDEPENDENT_AMBULATORY_CARE_PROVIDER_SITE_OTHER): Payer: Medicare HMO | Admitting: Family Medicine

## 2023-09-29 VITALS — BP 169/78 | HR 83 | Temp 97.9°F | Ht 62.0 in | Wt 147.8 lb

## 2023-09-29 DIAGNOSIS — R3129 Other microscopic hematuria: Secondary | ICD-10-CM | POA: Diagnosis not present

## 2023-09-29 DIAGNOSIS — K644 Residual hemorrhoidal skin tags: Secondary | ICD-10-CM

## 2023-09-29 DIAGNOSIS — R300 Dysuria: Secondary | ICD-10-CM | POA: Diagnosis not present

## 2023-09-29 DIAGNOSIS — R319 Hematuria, unspecified: Secondary | ICD-10-CM | POA: Diagnosis not present

## 2023-09-29 LAB — URINALYSIS, ROUTINE W REFLEX MICROSCOPIC
Bilirubin, UA: NEGATIVE
Glucose, UA: NEGATIVE
Ketones, UA: NEGATIVE
Leukocytes,UA: NEGATIVE
Nitrite, UA: NEGATIVE
Protein,UA: NEGATIVE
Specific Gravity, UA: <1.005 — ABNORMAL LOW (ref 1.005–1.030)
Urobilinogen, Ur: 0.2 mg/dL (ref 0.2–1.0)
pH, UA: 6.5 (ref 5.0–7.5)

## 2023-09-29 LAB — MICROSCOPIC EXAMINATION
Bacteria, UA: NONE SEEN
Epithelial Cells (non renal): NONE SEEN /[HPF] (ref 0–10)
Renal Epithel, UA: NONE SEEN /[HPF]
Yeast, UA: NONE SEEN

## 2023-09-29 NOTE — Progress Notes (Signed)
Subjective:  Patient ID: Sherry Salinas, female    DOB: 30-Jun-1956, 67 y.o.   MRN: [id number]  Patient Care Team: Sonny Masters, FNP as PCP - General (Family Medicine) Jena Gauss, Gerrit Friends, MD as Consulting Physician (Gastroenterology) Danella Maiers, [facility] as [facility] Care Management (Pharmacist)   Chief Complaint:  Hematuria and Dysuria (Patient states this has been going on x 1 month )   HPI: Sherry Salinas is a 67 y.o. female presenting on 09/29/2023 for Hematuria and Dysuria (Patient states this has been going on x 1 month )   Discussed the use of AI scribe software for clinical note transcription with the patient, who gave verbal consent to proceed.  History of Present Illness   The patient, known to have a history of diverticulitis and hemorrhoids, presented with acute urinary and vaginal symptoms. She reported a sudden onset of urinary frequency, needing to urinate every thirty minutes, and a sensation of bladder fullness. This was accompanied by an episode of significant hematuria, described as filling the toilet with red blood and stringy clots. The patient also reported vaginal and rectal bleeding during this episode.  In addition to these symptoms, the patient described experiencing sharp, stabbing pains in the vagina, originating from the middle of the pelvic bone and radiating down to the hip. She also reported a feeling of bloating and difficulty fitting into her pants.  The patient has a history of urological and gynecological care, but it has been over a year since her last urologist visit and an extended period since her last pelvic exam. She also has a history of constipation related to her diverticulitis.  The patient reported a high intake of caffeine, including coffee and tea, which she acknowledged could be contributing to her bladder symptoms. She also noted a significant external hemorrhoid, which had not previously bled but was inflamed at the time  of the consultation.         Relevant past medical, surgical, family, and social history reviewed and updated as indicated.  Allergies and medications reviewed and updated. Data reviewed: Chart in Epic.   Past Medical History:  Diagnosis Date   Anxiety    Aortic atherosclerosis (HCC)    Arthritis    Hyperlipidemia    Hypertension    IBS (irritable bowel syndrome)    Osteopenia 01/08/2021   Prediabetes 01/18/2022   Vitamin D deficiency 09/14/2017    Past Surgical History:  Procedure Laterality Date   COLONOSCOPY  2015   Dr. Madilyn Fireman: per pt, diverticulosis, 3 polyps, come back in five years.    COLONOSCOPY WITH ESOPHAGOGASTRODUODENOSCOPY (EGD)  02/2003   Dr. Madilyn Fireman: Grade 3 esophagitis, hiatal hernia with patulous GE junction, mild duodenitis.  CLOtest, results unavailable.  Colonoscopy normal.   COLONOSCOPY WITH PROPOFOL N/A 06/23/2021   Procedure: COLONOSCOPY WITH PROPOFOL;  Surgeon: Lanelle Bal, DO;  Location: [facility];  Service: Endoscopy;  Laterality: N/A;  10:30am   POLYPECTOMY  06/23/2021   Procedure: POLYPECTOMY INTESTINAL;  Surgeon: Lanelle Bal, DO;  Location: [facility];  Service: Endoscopy;;   REPLACEMENT TOTAL KNEE Right 2012   ROTATOR CUFF REPAIR Right     Social History   Socioeconomic History   Marital status: Married    Spouse name: Not on file   Number of children: 2   Years of education: Not on file   Highest education level: Associate degree: occupational, Scientist, product/process development, or vocational program  Occupational History   Occupation:  Retired    Comment: Previously EMT, FF, and RN  Tobacco Use   Smoking status: Every Day    Current packs/day: 1.00    Average packs/day: 1 pack/day for 30.0 years (30.0 ttl pk-yrs)    Types: Cigarettes   Smokeless tobacco: Never  Vaping Use   Vaping status: Never Used  Substance and Sexual Activity   Alcohol use: Never   Drug use: Never   Sexual activity: Not on file  Other Topics Concern   Not on file   Social History Narrative   Lives with husband, son and granddaughter    Social Determinants of Health   Financial Resource Strain: Low Risk  (09/29/2023)   Overall Financial Resource Strain (CARDIA)    Difficulty of Paying Living Expenses: Not very hard  Food Insecurity: Food Insecurity Present (09/29/2023)   Hunger Vital Sign    Worried About Running Out of Food in the Last Year: Sometimes true    Ran Out of Food in the Last Year: Never true  Transportation Needs: No Transportation Needs (09/29/2023)   PRAPARE - Administrator, Civil Service (Medical): No    Lack of Transportation (Non-Medical): No  Physical Activity: Insufficiently Active (09/29/2023)   Exercise Vital Sign    Days of Exercise per Week: 3 days    Minutes of Exercise per Session: 40 min  Stress: Stress Concern Present (09/29/2023)   [employer] of Occupational Health - Occupational Stress Questionnaire    Feeling of Stress : To some extent  Social Connections: Socially Integrated (09/29/2023)   Social Connection and Isolation Panel [NHANES]    Frequency of Communication with Friends and Family: More than three times a week    Frequency of Social Gatherings with Friends and Family: Three times a week    Attends Religious Services: More than 4 times per year    Active Member of Clubs or Organizations: No    Attends Banker Meetings: 1 to 4 times per year    Marital Status: Married  Catering manager Violence: Not At Risk (02/04/2023)   Humiliation, Afraid, Rape, and Kick questionnaire    Fear of Current or Ex-Partner: No    Emotionally Abused: No    Physically Abused: No    Sexually Abused: No    Outpatient Encounter Medications as of 09/29/2023  Medication Sig   amLODipine (NORVASC) 5 MG tablet TAKE 1 TABLET EVERY DAY   aspirin EC 81 MG tablet Take 81 mg by mouth daily. Swallow whole.   busPIRone (BUSPAR) 7.5 MG tablet TAKE 1 TABLET THREE TIMES DAILY   Calcium-Magnesium-Zinc  (CAL-MAG-ZINC PO) Take 1 tablet by mouth daily.   celecoxib (CELEBREX) 100 MG capsule Take 1 capsule (100 mg total) by mouth 2 (two) times daily.   dicyclomine (BENTYL) 20 MG tablet Take 1 tablet (20 mg total) by mouth 3 (three) times daily.   EPINEPHrine 0.3 mg/0.3 mL IJ SOAJ injection Inject 0.3 mg into the muscle as needed for anaphylaxis.   ezetimibe (ZETIA) 10 MG tablet Take 1 tablet (10 mg total) by mouth daily.   hydrochlorothiazide (HYDRODIURIL) 25 MG tablet TAKE 1 TABLET EVERY DAY   levocetirizine (XYZAL) 5 MG tablet Take 1 tablet (5 mg total) by mouth every evening.   methocarbamol (ROBAXIN) 500 MG tablet Take 1 tablet (500 mg total) by mouth every 8 (eight) hours as needed for muscle spasms.   metoprolol succinate (TOPROL-XL) 50 MG 24 hr tablet TAKE 1 TABLET DAILY TAKE WITH OR IMMEDIATELY FOLLOWING  A MEAL   mupirocin ointment (BACTROBAN) 2 % APPLY 1 APPLICATION TOPICALLY 2 (TWO) TIMES DAILY.   ondansetron (ZOFRAN) 4 MG tablet Take 1 tablet (4 mg total) by mouth every 8 (eight) hours as needed for nausea or vomiting.   pantoprazole (PROTONIX) 40 MG tablet TAKE 1 TABLET DAILY 30 MINUTES BEFORE BREAKFAST   pravastatin (PRAVACHOL) 10 MG tablet Take 1 tablet (10 mg total) by mouth daily.   prednisoLONE acetate (PRED FORTE) 1 % ophthalmic suspension Place 1 drop into both eyes every 4 (four) hours.   traMADol (ULTRAM) 50 MG tablet Take 1 tablet (50 mg total) by mouth 2 (two) times daily.   [START ON 10/18/2023] traMADol (ULTRAM) 50 MG tablet Take 1 tablet (50 mg total) by mouth 2 (two) times daily.   triamcinolone cream (KENALOG) 0.1 % APPLY TOPICALLY TWO TIMES DAILY AS NEEDED   [DISCONTINUED] amoxicillin-clavulanate (AUGMENTIN) 875-125 MG tablet Take 1 tablet by mouth 2 (two) times daily.   [DISCONTINUED] metroNIDAZOLE (FLAGYL) 500 MG tablet Take 1 tablet (500 mg total) by mouth 2 (two) times daily.   No facility-administered encounter medications on file as of 09/29/2023.    Allergies   Allergen Reactions   Bee Venom Anaphylaxis   Codeine Anaphylaxis   Statins Other (See Comments)    Muscle aches   Sulfa Antibiotics Rash   Sulfasalazine Rash   Anusol-Hc [Hydrocortisone]     Rash/itching, all hemorrhoid creams    Pertinent ROS per HPI, otherwise unremarkable      Objective:  BP (!) 169/78   Pulse 83   Temp 97.9 F (36.6 C) (Temporal)   Ht 5\' 2"  (1.575 m)   Wt 147 lb 12.8 oz (67 kg)   SpO2 98%   BMI 27.03 kg/m    Wt Readings from Last 3 Encounters:  09/29/23 147 lb 12.8 oz (67 kg)  09/08/23 146 lb 6.4 oz (66.4 kg)  08/19/23 145 lb 12.8 oz (66.1 kg)    Physical Exam Vitals and nursing note reviewed. Exam conducted with a chaperone present.  Constitutional:      General: She is not in acute distress.    Appearance: Normal appearance. She is overweight. She is not ill-appearing, toxic-appearing or diaphoretic.  HENT:     Head: Normocephalic and atraumatic.     Nose: Nose normal.     Mouth/Throat:     Mouth: Mucous membranes are moist.     Pharynx: Oropharynx is clear.  Eyes:     Conjunctiva/sclera: Conjunctivae normal.     Pupils: Pupils are equal, round, and reactive to light.  Cardiovascular:     Rate and Rhythm: Normal rate and regular rhythm.     Heart sounds: Normal heart sounds.  Pulmonary:     Effort: Pulmonary effort is normal.     Breath sounds: Normal breath sounds.  Abdominal:     Hernia: There is no hernia in the left inguinal area or right inguinal area.  Genitourinary:    Exam position: Lithotomy position.     Pubic Area: No rash or pubic lice.      Tanner stage (genital): 5.     Labia:        Right: No rash, tenderness, lesion or injury.        Left: No rash, tenderness, lesion or injury.      Urethra: No prolapse, urethral pain, urethral swelling or urethral lesion.     Vagina: Normal.     Cervix: Normal. No friability or cervical bleeding.  Rectum: External hemorrhoid present.  Musculoskeletal:     Cervical back:  Neck supple.  Lymphadenopathy:     Lower Body: No right inguinal adenopathy. No left inguinal adenopathy.  Skin:    General: Skin is warm and dry.     Capillary Refill: Capillary refill takes less than 2 seconds.  Neurological:     General: No focal deficit present.     Mental Status: She is alert and oriented to person, place, and time.  Psychiatric:        Mood and Affect: Mood normal.        Behavior: Behavior normal.        Thought Content: Thought content normal.        Judgment: Judgment normal.    Physical Exam   GENITOURINARY: No erythema or bleeding at urethral opening. Cervix and vaginal walls appear normal with no abnormalities. RECTAL: External hemorrhoid present, inflamed.        Results for orders placed or performed in visit on 09/08/23  Urine Culture   Specimen: Urine   UR  Result Value Ref Range   Urine Culture, Routine Final report    Organism ID, Bacteria Comment   Microscopic Examination   Urine  Result Value Ref Range   WBC, UA 0-5 0 - 5 /hpf   RBC, Urine 0-2 0 - 2 /hpf   Epithelial Cells (non renal) 0-10 0 - 10 /hpf   Renal Epithel, UA None seen None seen /hpf   Bacteria, UA Few (A) None seen/Few   Yeast, UA None seen None seen  Urinalysis, Routine w reflex microscopic  Result Value Ref Range   Specific Gravity, UA 1.020 1.005 - 1.030   pH, UA 7.0 5.0 - 7.5   Color, UA Yellow Yellow   Appearance Ur Clear Clear   Leukocytes,UA Trace (A) Negative   Protein,UA Negative Negative/Trace   Glucose, UA Negative Negative   Ketones, UA Negative Negative   RBC, UA Trace (A) Negative   Bilirubin, UA Negative Negative   Urobilinogen, Ur 0.2 0.2 - 1.0 mg/dL   Nitrite, UA Negative Negative   Microscopic Examination See below:        Pertinent labs & imaging results that were available during my care of the patient were reviewed by me and considered in my medical decision making.  Assessment & Plan:  Mariea was seen today for hematuria and  dysuria.  Diagnoses and all orders for this visit:  Other microscopic hematuria -     Urinalysis, Routine w reflex microscopic -     Ambulatory referral to Urology  External hemorrhoid -     Ambulatory referral to Gastroenterology     Assessment and Plan    Hematuria Recent episode of gross hematuria with clots, currently resolved. Urinalysis shows microscopic hematuria. No signs of infection. -Refer to Urology (Dr. Alvester Morin) for further evaluation.  External Hemorrhoid Large, inflamed, and likely source of rectal bleeding. No signs of clotting. -Refer to GI [facility]) for possible intervention.  Vaginal Pain Reports of sharp, stabbing pain in the vagina, radiating to the hip. Pelvic exam unremarkable with no signs of bleeding or abnormality. -Continue to monitor symptoms.  Bladder Spasms Frequent urination and urgency, exacerbated by caffeine intake. -Advise to reduce caffeine intake to alleviate symptoms.  General Health Maintenance -Continue regular check-ups and screenings as appropriate.          Continue all other maintenance medications.  Follow up plan: Return if symptoms worsen or fail to improve.  Continue healthy lifestyle choices, including diet (rich in fruits, vegetables, and lean proteins, and low in salt and simple carbohydrates) and exercise (at least 30 minutes of moderate physical activity daily).   The above assessment and management plan was discussed with the patient. The patient verbalized understanding of and has agreed to the management plan. Patient is aware to call the clinic if they develop any new symptoms or if symptoms persist or worsen. Patient is aware when to return to the clinic for a follow-up visit. Patient educated on when it is appropriate to go to the emergency department.   Kari Baars, FNP-C [facility] [phone]

## 2023-09-30 ENCOUNTER — Encounter: Payer: Self-pay | Admitting: Family Medicine

## 2023-09-30 MED ORDER — PHENAZOPYRIDINE HCL 100 MG PO TABS: 100.0000 mg | ORAL_TABLET | Freq: Three times a day (TID) | ORAL | 0 refills | Status: DC | PRN

## 2023-09-30 NOTE — Addendum Note (Signed)
Addended by: Sonny Masters on: 09/30/2023 01:58 PM   Modules accepted: Orders

## 2023-10-01 DIAGNOSIS — R509 Fever, unspecified: Secondary | ICD-10-CM | POA: Diagnosis not present

## 2023-10-01 DIAGNOSIS — R350 Frequency of micturition: Secondary | ICD-10-CM | POA: Diagnosis not present

## 2023-10-01 DIAGNOSIS — N3001 Acute cystitis with hematuria: Secondary | ICD-10-CM | POA: Diagnosis not present

## 2023-10-02 ENCOUNTER — Encounter: Payer: Self-pay | Admitting: Family Medicine

## 2023-10-10 ENCOUNTER — Ambulatory Visit: Payer: Medicare HMO | Admitting: Gastroenterology

## 2023-10-10 NOTE — Progress Notes (Deleted)
Name: Sherry Salinas DOB: September 28, 1956 MRN: [id number]  History of Present Illness: Sherry Salinas is a 67 y.o. female who presents today as a new patient at [facility]. Previously saw Dr. Alvester Morin at [facility] in 2021. ***request records?  Recent history:  > 09/08/2023: Seen by PCP. - Reported dysuria, urinary frequency, urgency, and gross hematuria.  - Urine microscopy showed few bacteria; no WBC or RBC. - Augmentin prescribed for suspected UTI, however urine culture came back negative.   > 09/29/2023: Seen by PCP. - Reported urinary frequency, urgency, sensation of bladder fullness, and an episode of gross hematuria. Also reported vaginal and rectal bleeding during this episode along with sharp, stabbing pains in the vagina.  - Reported "a high intake of caffeine, including coffee and tea, which she acknowledged could be contributing to her bladder symptoms." - Rectal bleeding attributed to an external hemorrhoid. - Pelvic exam unremarkable with no signs of bleeding or abnormality.  - Urine microscopy unremarkable.   > 10/01/2023: Seen at urgent care. - Reported dysuria, frequency, urgency, gross hematuria, and fever up to 100.9 F.  - UA positive for nitrites, large leukocytes, large blood. - Received IM Rocephin and was prescribed Macrobid for suspected UTI.  - Urine culture came back positive for >100k E. Coli; sensitive to Macrobid.  ***gross hematuria workup due to episode of this in September with negative UA + smoking history?  Today: She reports that the visible hematuria was first noticed *** ago and {gen continuous/intermittent/once:313061} (***still ongoing ***?).  She {Actions; denies-reports:120008} prior history of gross hematuria.   Today She {Actions; denies-reports:120008} urinary urgency, frequency, dysuria, straining to void, or sensations of incomplete emptying. She {Actions; denies-reports:120008} ***abdominal or ***flank pain. She {Actions;  denies-reports:120008} fevers.  She {Actions; denies-reports:120008} history of kidney stones.  She {Actions; denies-reports:120008} history of pyelonephritis.  She {Actions; denies-reports:120008} history of recent or recurrent UTI. She {Actions; denies-reports:120008} history of GU malignancy or pelvic radiation.  She {Actions; denies-reports:120008} history of autoimmune disease. She {Actions; denies-reports:120008} history of smoking (quit ***; has smoked *** ppd x*** years). She {Actions; denies-reports:120008} known occupational risks. She {Actions; denies-reports:120008} recent vigorous exercise which they think may be contributory to hematuria. She {Actions; denies-reports:120008} any recent trauma or prolonged pressure to the perineal area. She {Actions; denies-reports:120008} recent illness. She {Actions; denies-reports:120008} taking anticoagulants (***).   Fall Screening: Do you usually have a device to assist in your mobility? {yes/no:20286} ***cane / ***walker / ***wheelchair  Medications: Current Outpatient Medications  Medication Sig Dispense Refill   amLODipine (NORVASC) 5 MG tablet TAKE 1 TABLET EVERY DAY 90 tablet 0   aspirin EC 81 MG tablet Take 81 mg by mouth daily. Swallow whole.     busPIRone (BUSPAR) 7.5 MG tablet TAKE 1 TABLET THREE TIMES DAILY 270 tablet 0   Calcium-Magnesium-Zinc (CAL-MAG-ZINC PO) Take 1 tablet by mouth daily.     celecoxib (CELEBREX) 100 MG capsule Take 1 capsule (100 mg total) by mouth 2 (two) times daily. 180 capsule 1   dicyclomine (BENTYL) 20 MG tablet Take 1 tablet (20 mg total) by mouth 3 (three) times daily. 270 tablet 3   EPINEPHrine 0.3 mg/0.3 mL IJ SOAJ injection Inject 0.3 mg into the muscle as needed for anaphylaxis. 1 each 3   ezetimibe (ZETIA) 10 MG tablet Take 1 tablet (10 mg total) by mouth daily. 90 tablet 0   hydrochlorothiazide (HYDRODIURIL) 25 MG tablet TAKE 1 TABLET EVERY DAY 90 tablet 0   levocetirizine (XYZAL) 5 MG  tablet Take 1 tablet (5 mg total) by mouth every evening. 90 tablet 0   methocarbamol (ROBAXIN) 500 MG tablet Take 1 tablet (500 mg total) by mouth every 8 (eight) hours as needed for muscle spasms. 60 tablet 5   metoprolol succinate (TOPROL-XL) 50 MG 24 hr tablet TAKE 1 TABLET DAILY TAKE WITH OR IMMEDIATELY FOLLOWING A MEAL 90 tablet 0   mupirocin ointment (BACTROBAN) 2 % APPLY 1 APPLICATION TOPICALLY 2 (TWO) TIMES DAILY. 22 g 11   ondansetron (ZOFRAN) 4 MG tablet Take 1 tablet (4 mg total) by mouth every 8 (eight) hours as needed for nausea or vomiting. 20 tablet 1   pantoprazole (PROTONIX) 40 MG tablet TAKE 1 TABLET DAILY 30 MINUTES BEFORE BREAKFAST 90 tablet 0   phenazopyridine (PYRIDIUM) 100 MG tablet Take 1 tablet (100 mg total) by mouth 3 (three) times daily as needed for pain. 10 tablet 0   pravastatin (PRAVACHOL) 10 MG tablet Take 1 tablet (10 mg total) by mouth daily. 90 tablet 0   prednisoLONE acetate (PRED FORTE) 1 % ophthalmic suspension Place 1 drop into both eyes every 4 (four) hours. 5 mL 0   traMADol (ULTRAM) 50 MG tablet Take 1 tablet (50 mg total) by mouth 2 (two) times daily. 60 tablet 0   [START ON 10/18/2023] traMADol (ULTRAM) 50 MG tablet Take 1 tablet (50 mg total) by mouth 2 (two) times daily. 60 tablet 0   triamcinolone cream (KENALOG) 0.1 % APPLY TOPICALLY TWO TIMES DAILY AS NEEDED 45 g 3   No current facility-administered medications for this visit.    Allergies: Allergies  Allergen Reactions   Bee Venom Anaphylaxis   Codeine Anaphylaxis   Statins Other (See Comments)    Muscle aches   Sulfa Antibiotics Rash   Sulfasalazine Rash   Anusol-Hc [Hydrocortisone]     Rash/itching, all hemorrhoid creams    Past Medical History:  Diagnosis Date   Anxiety    Aortic atherosclerosis (HCC)    Arthritis    Hyperlipidemia    Hypertension    IBS (irritable bowel syndrome)    Osteopenia 01/08/2021   Prediabetes 01/18/2022   Vitamin D deficiency 09/14/2017   Past  Surgical History:  Procedure Laterality Date   COLONOSCOPY  2015   Dr. Madilyn Fireman: per pt, diverticulosis, 3 polyps, come back in five years.    COLONOSCOPY WITH ESOPHAGOGASTRODUODENOSCOPY (EGD)  02/2003   Dr. Madilyn Fireman: Grade 3 esophagitis, hiatal hernia with patulous GE junction, mild duodenitis.  CLOtest, results unavailable.  Colonoscopy normal.   COLONOSCOPY WITH PROPOFOL N/A 06/23/2021   Procedure: COLONOSCOPY WITH PROPOFOL;  Surgeon: Lanelle Bal, DO;  Location: [facility];  Service: Endoscopy;  Laterality: N/A;  10:30am   POLYPECTOMY  06/23/2021   Procedure: POLYPECTOMY INTESTINAL;  Surgeon: Lanelle Bal, DO;  Location: [facility];  Service: Endoscopy;;   REPLACEMENT TOTAL KNEE Right 2012   ROTATOR CUFF REPAIR Right    Family History  Problem Relation Age of Onset   Arthritis Mother    Kidney failure Mother    Rheum arthritis Mother    Stroke Father    Heart disease Father    Heart attack Father    Stroke Maternal Grandfather    Early death Paternal Grandmother        Childbirth   Early death Paternal Grandfather        MVA   Colon cancer Paternal Uncle    Other Brother        Reyes Ivan syndrome  Social History   Socioeconomic History   Marital status: Married    Spouse name: Not on file   Number of children: 2   Years of education: Not on file   Highest education level: Associate degree: occupational, Scientist, product/process development, or vocational program  Occupational History   Occupation: Retired    Comment: Previously EMT, FF, and RN  Tobacco Use   Smoking status: Every Day    Current packs/day: 1.00    Average packs/day: 1 pack/day for 30.0 years (30.0 ttl pk-yrs)    Types: Cigarettes   Smokeless tobacco: Never  Vaping Use   Vaping status: Never Used  Substance and Sexual Activity   Alcohol use: Never   Drug use: Never   Sexual activity: Not on file  Other Topics Concern   Not on file  Social History Narrative   Lives with husband, son and granddaughter     Social Determinants of Health   Financial Resource Strain: Low Risk  (09/29/2023)   Overall Financial Resource Strain (CARDIA)    Difficulty of Paying Living Expenses: Not very hard  Food Insecurity: Food Insecurity Present (09/29/2023)   Hunger Vital Sign    Worried About Running Out of Food in the Last Year: Sometimes true    Ran Out of Food in the Last Year: Never true  Transportation Needs: No Transportation Needs (09/29/2023)   PRAPARE - Administrator, Civil Service (Medical): No    Lack of Transportation (Non-Medical): No  Physical Activity: Insufficiently Active (09/29/2023)   Exercise Vital Sign    Days of Exercise per Week: 3 days    Minutes of Exercise per Session: 40 min  Stress: Stress Concern Present (09/29/2023)   [employer] of Occupational Health - Occupational Stress Questionnaire    Feeling of Stress : To some extent  Social Connections: Socially Integrated (09/29/2023)   Social Connection and Isolation Panel [NHANES]    Frequency of Communication with Friends and Family: More than three times a week    Frequency of Social Gatherings with Friends and Family: Three times a week    Attends Religious Services: More than 4 times per year    Active Member of Clubs or Organizations: No    Attends Banker Meetings: 1 to 4 times per year    Marital Status: Married  Catering manager Violence: Not At Risk (02/04/2023)   Humiliation, Afraid, Rape, and Kick questionnaire    Fear of Current or Ex-Partner: No    Emotionally Abused: No    Physically Abused: No    Sexually Abused: No    SUBJECTIVE  Review of Systems*** Constitutional: Patient denies any unintentional weight loss or change in strength lntegumentary: Patient denies any rashes or pruritus Cardiovascular: Patient denies chest pain or syncope Respiratory: Patient denies shortness of breath Gastrointestinal: Patient ***denies nausea, vomiting, constipation, or  diarrhea Musculoskeletal: Patient denies muscle cramps or weakness Neurologic: Patient denies convulsions or seizures Allergic/Immunologic: Patient denies recent allergic reaction(s) Hematologic/Lymphatic: Patient denies bleeding tendencies Endocrine: Patient denies heat/cold intolerance  GU: As per HPI.  OBJECTIVE There were no vitals filed for this visit. There is no height or weight on file to calculate BMI.  Physical Examination*** Constitutional: No obvious distress; patient is non-toxic appearing  Cardiovascular: No visible lower extremity edema.  Respiratory: The patient does not have audible wheezing/stridor; respirations do not appear labored  Gastrointestinal: Abdomen non-distended Musculoskeletal: Normal ROM of UEs  Skin: No obvious rashes/open sores  Neurologic: CN 2-12 grossly intact Psychiatric: Answered  questions appropriately with normal affect  Hematologic/Lymphatic/Immunologic: No obvious bruises or sites of spontaneous bleeding  UA: ***negative *** WBC/hpf, *** RBC/hpf, bacteria (***) PVR: *** ml  ASSESSMENT Hematuria, microscopic  Tobacco use  For management of gross hematuria, we discussed possible etiologies including but not limited to: vigorous exercise, sexual activity, stone, trauma, blood thinner use, urinary tract infection, kidney function, ***BPH, ***radiation cystitis, malignancy. ***We discussed pt's smoking as a risk factor for GU cancer and encouraged ***continued smoking cessation.***  We discussed the importance of work-up including assessing the upper and lower GU tract with CT urogram and cystoscopy. We will also check ***CMP, ***CBC, and ***voided cytology.  We discussed the risk for clot retention and pt was advised to increase fluid intake to thin out clots. Pt was advised to go to the ER if they become unable to urinate due to clot retention, start having symptoms of anemia (which were discussed), or any other significant concerning acute  symptoms.  Pt verbalized understanding and decided to pursue this work-up. Patient agreed to follow-up afterward to discuss the results and formulate a treatment plan based on the findings. All questions were answered.   PLAN Advised the following: CT ***ordered. Labs (CBC, CMP, ***PSA) ordered. Voided cytology ***ordered. 4. ***No follow-ups on file.  No orders of the defined types were placed in this encounter.   It has been explained that the patient is to follow regularly with their PCP in addition to all other providers involved in their care and to follow instructions provided by these respective offices. Patient advised to contact urology clinic if any urologic-pertaining questions, concerns, new symptoms or problems arise in the interim period.  There are no Patient Instructions on file for this visit.  Electronically signed by:  Donnita Falls, MSN, FNP-C, CUNP 10/10/2023 2:41 PM

## 2023-10-11 ENCOUNTER — Ambulatory Visit: Payer: Medicare HMO | Admitting: Urology

## 2023-10-12 ENCOUNTER — Ambulatory Visit: Payer: Medicare HMO | Admitting: Gastroenterology

## 2023-10-21 ENCOUNTER — Ambulatory Visit: Payer: Medicare HMO | Admitting: Urology

## 2023-10-21 ENCOUNTER — Encounter: Payer: Self-pay | Admitting: Urology

## 2023-10-21 VITALS — BP 146/116 | HR 83 | Temp 98.4°F | Ht 63.0 in | Wt 145.0 lb

## 2023-10-21 DIAGNOSIS — R109 Unspecified abdominal pain: Secondary | ICD-10-CM

## 2023-10-21 DIAGNOSIS — R300 Dysuria: Secondary | ICD-10-CM

## 2023-10-21 DIAGNOSIS — Z720 Tobacco use: Secondary | ICD-10-CM

## 2023-10-21 DIAGNOSIS — R8289 Other abnormal findings on cytological and histological examination of urine: Secondary | ICD-10-CM | POA: Diagnosis not present

## 2023-10-21 DIAGNOSIS — Z87440 Personal history of urinary (tract) infections: Secondary | ICD-10-CM | POA: Diagnosis not present

## 2023-10-21 DIAGNOSIS — R310 Gross hematuria: Secondary | ICD-10-CM

## 2023-10-21 DIAGNOSIS — R3129 Other microscopic hematuria: Secondary | ICD-10-CM

## 2023-10-21 DIAGNOSIS — R399 Unspecified symptoms and signs involving the genitourinary system: Secondary | ICD-10-CM

## 2023-10-21 DIAGNOSIS — R5381 Other malaise: Secondary | ICD-10-CM

## 2023-10-21 DIAGNOSIS — R1030 Lower abdominal pain, unspecified: Secondary | ICD-10-CM

## 2023-10-21 LAB — URINALYSIS, ROUTINE W REFLEX MICROSCOPIC
Bilirubin, UA: NEGATIVE
Glucose, UA: NEGATIVE
Ketones, UA: NEGATIVE
Nitrite, UA: NEGATIVE
Protein,UA: NEGATIVE
Specific Gravity, UA: 1.01 (ref 1.005–1.030)
Urobilinogen, Ur: 0.2 mg/dL (ref 0.2–1.0)
pH, UA: 7 (ref 5.0–7.5)

## 2023-10-21 LAB — MICROSCOPIC EXAMINATION: Bacteria, UA: NONE SEEN

## 2023-10-21 MED ORDER — NITROFURANTOIN MONOHYD MACRO 100 MG PO CAPS: 100.0000 mg | ORAL_CAPSULE | Freq: Two times a day (BID) | ORAL | 0 refills | Status: AC

## 2023-10-21 NOTE — Progress Notes (Signed)
Name: Sherry Salinas DOB: [birth year] MRN: [id number]  History of Present Illness: Sherry Salinas is a 67 y.o. female who presents today as a new patient at [facility] ([facility] [place].   Per chart review: > 01/21/2020: CT abdomen/pelvis w/ contrast showed no GU stones, masses, or hydronephrosis;  bladder unremarkable.  > 03/15/2023: Normal renal function (creatinine 0.88, GFR 72).  > 09/08/2023: Seen by PCP. - Reported dysuria, urinary frequency, urgency, and gross hematuria. - Urine microscopy showed few bacteria; no WBC or RBC. - Augmentin prescribed for suspected UTI, however urine culture came back negative.  > 09/29/2023: Seen by PCP. - Reported urinary frequency, urgency, sensation of bladder fullness, and an episode of gross hematuria. Also reported vaginal and rectal bleeding during this episode along with sharp, stabbing pains in the vagina.  - Reported "a high intake of caffeine, including coffee and tea, which she acknowledged could be contributing to her bladder symptoms." - Rectal bleeding attributed to an external hemorrhoid.  - Pelvic exam unremarkable with no signs of bleeding or abnormality. - Urine microscopy unremarkable.  > 10/01/2023: Seen at urgent care. - Reported dysuria, frequency, urgency, gross hematuria, and fever up to 100.9 F.  - UA positive for nitrites, large leukocytes, large blood. - Received IM Rocephin and was prescribed Macrobid for suspected UTI.  - Urine culture came back positive for >100k E. Coli; sensitive to Macrobid.  Today: She reports suspected UTI today. States that since yesterday she has been having dysuria, increased urinary urgency, frequency, some urinary hesitancy, straining to void, and sensations of incomplete emptying. She had another episode of gross hematuria yesterday morning. She reports feeling like she has a low grade fever and feels bloated. Reports nausea and vomiting (has taken Zofran PRN). She reports midline low abdominal  pain described as burning and spasms. Reports some bilateral low back pain and bilateral flank pain.   Reports that she often gets a flare up of diverticulitis when her bladder is inflamed. 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   She denies history of kidney stones.  She denies history of pyelonephritis.  She denies history of GU malignancy or pelvic radiation.  She denies history of autoimmune disease. She reports history of smoking (has smoked 1 ppd x20-30 years). She was previously taking Aspirin 81 mg daily but denies taking that for awhile now.  Previously saw Dr. Alvester Morin at [facility] in 2021. She reports that a cystoscopy was performed and no concerning findings were identified.  Urine culture results in past 12 months: - 03/07/2023: 25-50k Beta hemolytic Streptococcus, group B  - 03/31/2023: Negative - 08/19/2023: Negative - 09/08/2023: Negative - 10/01/2023: >100k E. Coli  Fall Screening: Do you usually have a device to assist in your mobility? No   Medications: Current Outpatient Medications  Medication Sig Dispense Refill   amLODipine (NORVASC) 5 MG tablet TAKE 1 TABLET EVERY DAY 90 tablet 0   busPIRone (BUSPAR) 7.5 MG tablet TAKE 1 TABLET THREE TIMES DAILY 270 tablet 0   Calcium-Magnesium-Zinc (CAL-MAG-ZINC PO) Take 1 tablet by mouth daily.     celecoxib (CELEBREX) 100 MG capsule Take 1 capsule (100 mg total) by mouth 2 (two) times daily. 180 capsule 1   cholecalciferol (VITAMIN D3) 25 MCG (1000 UNIT) tablet Take 2,000 Units by mouth daily.     dicyclomine (BENTYL) 20 MG tablet Take 1  tablet (20 mg total) by mouth 3 (three) times daily. 270 tablet 3   EPINEPHrine 0.3 mg/0.3 mL IJ SOAJ injection Inject 0.3 mg into the muscle as needed for anaphylaxis. 1 each 3   ezetimibe (ZETIA) 10 MG tablet Take 1 tablet (10 mg total) by mouth daily. 90 tablet 0    hydrochlorothiazide (HYDRODIURIL) 25 MG tablet TAKE 1 TABLET EVERY DAY 90 tablet 0   levocetirizine (XYZAL) 5 MG tablet Take 1 tablet (5 mg total) by mouth every evening. 90 tablet 0   methocarbamol (ROBAXIN) 500 MG tablet Take 1 tablet (500 mg total) by mouth every 8 (eight) hours as needed for muscle spasms. 60 tablet 5   metoprolol succinate (TOPROL-XL) 50 MG 24 hr tablet TAKE 1 TABLET DAILY TAKE WITH OR IMMEDIATELY FOLLOWING A MEAL 90 tablet 0   mupirocin ointment (BACTROBAN) 2 % APPLY 1 APPLICATION TOPICALLY 2 (TWO) TIMES DAILY. 22 g 11   nitrofurantoin, macrocrystal-monohydrate, (MACROBID) 100 MG capsule Take 1 capsule (100 mg total) by mouth 2 (two) times daily for 7 days. 14 capsule 0   ondansetron (ZOFRAN) 4 MG tablet Take 1 tablet (4 mg total) by mouth every 8 (eight) hours as needed for nausea or vomiting. 20 tablet 1   pantoprazole (PROTONIX) 40 MG tablet TAKE 1 TABLET DAILY 30 MINUTES BEFORE BREAKFAST 90 tablet 0   pravastatin (PRAVACHOL) 10 MG tablet Take 1 tablet (10 mg total) by mouth daily. 90 tablet 0   prednisoLONE acetate (PRED FORTE) 1 % ophthalmic suspension Place 1 drop into both eyes every 4 (four) hours. 5 mL 0   pyridOXINE (VITAMIN B6) 100 MG tablet Take 200 mg by mouth daily.     traMADol (ULTRAM) 50 MG tablet Take 1 tablet (50 mg total) by mouth 2 (two) times daily. 60 tablet 0   traMADol (ULTRAM) 50 MG tablet Take 1 tablet (50 mg total) by mouth 2 (two) times daily. 60 tablet 0   triamcinolone cream (KENALOG) 0.1 % APPLY TOPICALLY TWO TIMES DAILY AS NEEDED 45 g 3   aspirin EC 81 MG tablet Take 81 mg by mouth daily. Swallow whole. (Patient not taking: Reported on 10/21/2023)     No current facility-administered medications for this visit.    Allergies: Allergies  Allergen Reactions   Bee Venom Anaphylaxis   Codeine Anaphylaxis   Statins Other (See Comments)    Muscle aches   Anusol-Hc [Hydrocortisone]     Rash/itching, all hemorrhoid creams    Past Medical  History:  Diagnosis Date   Anxiety    Aortic atherosclerosis (HCC)    Arthritis    Hyperlipidemia    Hypertension    IBS (irritable bowel syndrome)    Osteopenia 01/08/2021   Prediabetes 01/18/2022   Vitamin D deficiency 09/14/2017   Past Surgical History:  Procedure Laterality Date   COLONOSCOPY  2015   Dr. Madilyn Fireman: per pt, diverticulosis, 3 polyps, come back in five years.    COLONOSCOPY WITH ESOPHAGOGASTRODUODENOSCOPY (EGD)  02/2003   Dr. Madilyn Fireman: Grade 3 esophagitis, hiatal hernia with patulous GE junction, mild duodenitis.  CLOtest, results unavailable.  Colonoscopy normal.   COLONOSCOPY WITH PROPOFOL N/A 06/23/2021   Procedure: COLONOSCOPY WITH PROPOFOL;  Surgeon: Lanelle Bal, DO;  Location: [facility];  Service: Endoscopy;  Laterality: N/A;  10:30am   POLYPECTOMY  06/23/2021   Procedure: POLYPECTOMY INTESTINAL;  Surgeon: Lanelle Bal, DO;  Location: [facility];  Service: Endoscopy;;   REPLACEMENT TOTAL KNEE Right 2012   ROTATOR  CUFF REPAIR Right    Family History  Problem Relation Age of Onset   Arthritis Mother    Kidney failure Mother    Rheum arthritis Mother    Stroke Father    Heart disease Father    Heart attack Father    Stroke Maternal Grandfather    Early death Paternal Grandmother        Childbirth   Early death Paternal Grandfather        MVA   Colon cancer Paternal Uncle    Other Brother        Scientific laboratory technician syndrome   Social History   Socioeconomic History   Marital status: Married    Spouse name: Not on file   Number of children: 2   Years of education: Not on file   Highest education level: Associate degree: occupational, Scientist, product/process development, or vocational program  Occupational History   Occupation: Retired    Comment: Previously EMT, FF, and RN  Tobacco Use   Smoking status: Every Day    Current packs/day: 1.00    Average packs/day: 1 pack/day for 30.0 years (30.0 ttl pk-yrs)    Types: Cigarettes   Smokeless tobacco: Never  Vaping  Use   Vaping status: Never Used  Substance and Sexual Activity   Alcohol use: Never   Drug use: Never   Sexual activity: Not on file  Other Topics Concern   Not on file  Social History Narrative   Lives with husband, son and granddaughter    Social Determinants of Health   Financial Resource Strain: Low Risk  (09/29/2023)   Overall Financial Resource Strain (CARDIA)    Difficulty of Paying Living Expenses: Not very hard  Food Insecurity: Food Insecurity Present (09/29/2023)   Hunger Vital Sign    Worried About Running Out of Food in the Last Year: Sometimes true    Ran Out of Food in the Last Year: Never true  Transportation Needs: No Transportation Needs (09/29/2023)   PRAPARE - Administrator, Civil Service (Medical): No    Lack of Transportation (Non-Medical): No  Physical Activity: Insufficiently Active (09/29/2023)   Exercise Vital Sign    Days of Exercise per Week: 3 days    Minutes of Exercise per Session: 40 min  Stress: Stress Concern Present (09/29/2023)   [employer] of Occupational Health - Occupational Stress Questionnaire    Feeling of Stress : To some extent  Social Connections: Socially Integrated (09/29/2023)   Social Connection and Isolation Panel [NHANES]    Frequency of Communication with Friends and Family: More than three times a week    Frequency of Social Gatherings with Friends and Family: Three times a week    Attends Religious Services: More than 4 times per year    Active Member of Clubs or Organizations: No    Attends Banker Meetings: 1 to 4 times per year    Marital Status: Married  Catering manager Violence: Not At Risk (02/04/2023)   Humiliation, Afraid, Rape, and Kick questionnaire    Fear of Current or Ex-Partner: No    Emotionally Abused: No    Physically Abused: No    Sexually Abused: No    SUBJECTIVE  Review of Systems Constitutional: Patient reports general malaise lntegumentary: Patient denies any  rashes or pruritus Cardiovascular: Patient denies chest pain or syncope Respiratory: Patient denies shortness of breath Gastrointestinal: As per HPI  Musculoskeletal: Patient denies muscle cramps or weakness Neurologic: Patient denies convulsions or seizures Allergic/Immunologic:  Patient denies recent allergic reaction(s) Endocrine: Patient reports feeling feverish  GU: As per HPI.  OBJECTIVE Vitals:   10/21/23 0916  BP: (!) 146/116  Pulse: 83  Temp: 98.4 F (36.9 C)   Body mass index is 25.69 kg/m.  Physical Examination Constitutional: Patient is non-toxic appearing  Cardiovascular: No visible lower extremity edema.  Respiratory: The patient does not have audible wheezing/stridor; respirations do not appear labored  Gastrointestinal: Abdomen non-distended Musculoskeletal: Normal ROM of UEs  Skin: No obvious rashes/open sores  Neurologic: CN 2-12 grossly intact Psychiatric: Answered questions appropriately with normal affect  Hematologic/Lymphatic/Immunologic: No obvious bruises or sites of spontaneous bleeding  UA: trace blood and trace leukocytes Urine microscopy: no evidence of UTI or microscopic hematuria PVR: 25 ml  ASSESSMENT Gross hematuria - Plan: Urinalysis, Routine w reflex microscopic, BLADDER SCAN AMB NON-IMAGING, Cytology, urine, CT HEMATURIA WORKUP, CBC, Comprehensive metabolic panel  Hematuria, microscopic  Tobacco use - Plan: CT HEMATURIA WORKUP  History of UTI - Plan: CBC, Comprehensive metabolic panel, Urine culture, nitrofurantoin, macrocrystal-monohydrate, (MACROBID) 100 MG capsule  Dysuria - Plan: Urine culture, nitrofurantoin, macrocrystal-monohydrate, (MACROBID) 100 MG capsule  Malaise  Lower abdominal pain  Bilateral flank pain  Lower urinary tract symptoms (LUTS)  For management of gross hematuria, we discussed possible etiologies including but not limited to: vigorous exercise, sexual activity, stone, trauma, blood thinner use, urinary  tract infection, kidney function, malignancy. We discussed pt's smoking as a risk factor for GU cancer and encouraged smoking cessation.  We discussed the importance of work-up including assessing the upper and lower GU tract with CT urogram and cystoscopy. We will also check voided cytology, CMP, and CBC.   We discussed that today's urine microscopy was unremarkable. She is highly concerned that she is at the beginning of another UTI which isn't showing up on urine testing yet and states "it's just like last time and I just know I'm going to end up in the hospital this weekend" if not provided with an antibiotic. Agreed to send urine for culture and to start empiric treatment with Nitrofurantoin based on history (UTI symptoms with negative UA/micro on 09/29/23 then positive urine culture two days later on 10/01/23).  We reviewed previous recommendation by PCP to reduce caffeine intake for management of LUTS (urgency / frequency).  She was advised to go to the ER if She develops fever >100.5 F, uncontrollable pain, or other significantly concerning symptoms.  Pt verbalized understanding and decided to pursue this work-up. Patient agreed to follow-up afterward to discuss the results and formulate a treatment plan based on the findings. All questions were answered.   PLAN Advised the following: CT ordered. Labs (CBC, CMP, voided cytology, urine culture) ordered. Nitrofurantoin 100 mg 2x/day x7 days. Reduce caffeine intake. Follow up with GI as previously advised for colonoscopy. Return for 1st available cystoscopy with any urology MD.   Orders Placed This Encounter  Procedures   Urine culture   CT HEMATURIA WORKUP    Standing Status:   Future    Standing Expiration Date:   10/20/2024    Order Specific Question:   Reason for Exam (SYMPTOM  OR DIAGNOSIS REQUIRED)    Answer:   Gross hematuria    Order Specific Question:   Preferred imaging location?    Answer:   [facility]    Urinalysis, Routine w reflex microscopic   CBC   Comprehensive metabolic panel   BLADDER SCAN AMB NON-IMAGING    It has been explained that the patient  is to follow regularly with their PCP in addition to all other providers involved in their care and to follow instructions provided by these respective offices. Patient advised to contact urology clinic if any urologic-pertaining questions, concerns, new symptoms or problems arise in the interim period.  There are no Patient Instructions on file for this visit.  Electronically signed by:  Donnita Falls, MSN, FNP-C, CUNP 10/21/2023 10:11 AM

## 2023-10-21 NOTE — Progress Notes (Signed)
post void residual =25ml 

## 2023-10-22 LAB — CBC
Hematocrit: 44.9 % (ref 34.0–46.6)
Hemoglobin: 14.4 g/dL (ref 11.1–15.9)
MCH: 32.1 pg (ref 26.6–33.0)
MCHC: 32.1 g/dL (ref 31.5–35.7)
MCV: 100 fL — ABNORMAL HIGH (ref 79–97)
Platelets: 272 10*3/uL (ref 150–450)
RBC: 4.48 x10E6/uL (ref 3.77–5.28)
RDW: 11.1 % — ABNORMAL LOW (ref 11.7–15.4)
WBC: 9.8 10*3/uL (ref 3.4–10.8)

## 2023-10-23 LAB — URINE CULTURE

## 2023-10-24 LAB — CYTOLOGY, URINE

## 2023-11-10 ENCOUNTER — Other Ambulatory Visit: Payer: Self-pay | Admitting: Family Medicine

## 2023-11-10 DIAGNOSIS — F411 Generalized anxiety disorder: Secondary | ICD-10-CM

## 2023-11-10 DIAGNOSIS — I10 Essential (primary) hypertension: Secondary | ICD-10-CM

## 2023-11-18 ENCOUNTER — Other Ambulatory Visit: Payer: Self-pay | Admitting: Family Medicine

## 2023-11-18 DIAGNOSIS — J301 Allergic rhinitis due to pollen: Secondary | ICD-10-CM

## 2023-11-22 ENCOUNTER — Ambulatory Visit: Payer: Medicare HMO | Admitting: Family Medicine

## 2023-11-22 ENCOUNTER — Other Ambulatory Visit: Payer: Medicare HMO

## 2023-11-22 ENCOUNTER — Encounter: Payer: Self-pay | Admitting: Family Medicine

## 2023-11-22 VITALS — BP 149/81 | HR 80 | Temp 97.6°F | Ht 63.0 in | Wt 148.4 lb

## 2023-11-22 DIAGNOSIS — M25551 Pain in right hip: Secondary | ICD-10-CM | POA: Diagnosis not present

## 2023-11-22 DIAGNOSIS — E538 Deficiency of other specified B group vitamins: Secondary | ICD-10-CM | POA: Diagnosis not present

## 2023-11-22 DIAGNOSIS — E559 Vitamin D deficiency, unspecified: Secondary | ICD-10-CM | POA: Diagnosis not present

## 2023-11-22 DIAGNOSIS — J301 Allergic rhinitis due to pollen: Secondary | ICD-10-CM

## 2023-11-22 DIAGNOSIS — G8929 Other chronic pain: Secondary | ICD-10-CM

## 2023-11-22 DIAGNOSIS — M25561 Pain in right knee: Secondary | ICD-10-CM | POA: Diagnosis not present

## 2023-11-22 DIAGNOSIS — I700 Atherosclerosis of aorta: Secondary | ICD-10-CM

## 2023-11-22 DIAGNOSIS — I10 Essential (primary) hypertension: Secondary | ICD-10-CM | POA: Diagnosis not present

## 2023-11-22 DIAGNOSIS — K219 Gastro-esophageal reflux disease without esophagitis: Secondary | ICD-10-CM

## 2023-11-22 DIAGNOSIS — E782 Mixed hyperlipidemia: Secondary | ICD-10-CM | POA: Diagnosis not present

## 2023-11-22 DIAGNOSIS — Z0000 Encounter for general adult medical examination without abnormal findings: Secondary | ICD-10-CM

## 2023-11-22 DIAGNOSIS — Z0001 Encounter for general adult medical examination with abnormal findings: Secondary | ICD-10-CM | POA: Diagnosis not present

## 2023-11-22 DIAGNOSIS — M25512 Pain in left shoulder: Secondary | ICD-10-CM | POA: Diagnosis not present

## 2023-11-22 MED ORDER — TRAMADOL HCL 50 MG PO TABS: 50.0000 mg | ORAL_TABLET | Freq: Two times a day (BID) | ORAL | 0 refills | Status: DC

## 2023-11-22 MED ORDER — METOPROLOL SUCCINATE ER 50 MG PO TB24: 50.0000 mg | ORAL_TABLET | Freq: Every day | ORAL | 1 refills | Status: DC

## 2023-11-22 MED ORDER — PANTOPRAZOLE SODIUM 40 MG PO TBEC: 40.0000 mg | DELAYED_RELEASE_TABLET | Freq: Every day | ORAL | 1 refills | Status: DC

## 2023-11-22 MED ORDER — PRAVASTATIN SODIUM 10 MG PO TABS: 10.0000 mg | ORAL_TABLET | Freq: Every day | ORAL | 1 refills | Status: DC

## 2023-11-22 MED ORDER — EZETIMIBE 10 MG PO TABS: 10.0000 mg | ORAL_TABLET | Freq: Every day | ORAL | 1 refills | Status: DC

## 2023-11-22 MED ORDER — METHOCARBAMOL 500 MG PO TABS: 500.0000 mg | ORAL_TABLET | Freq: Three times a day (TID) | ORAL | 5 refills | Status: AC | PRN

## 2023-11-22 MED ORDER — LEVOCETIRIZINE DIHYDROCHLORIDE 5 MG PO TABS: 5.0000 mg | ORAL_TABLET | Freq: Every evening | ORAL | 3 refills | Status: DC
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
## 2023-11-23 ENCOUNTER — Encounter: Payer: Self-pay | Admitting: Family Medicine

## 2023-11-23 LAB — LIPID PANEL
Chol/HDL Ratio: 5.4 {ratio} — ABNORMAL HIGH (ref 0.0–4.4)
Cholesterol, Total: 198 mg/dL (ref 100–199)
HDL: 37 mg/dL — ABNORMAL LOW (ref >39)
LDL Chol Calc (NIH): 125 mg/dL — ABNORMAL HIGH (ref 0–99)
Triglycerides: 203 mg/dL — ABNORMAL HIGH (ref 0–149)
VLDL Cholesterol Cal: 36 mg/dL (ref 5–40)

## 2023-11-23 LAB — CMP14+EGFR
ALT: 25 [IU]/L (ref 0–32)
AST: 25 [IU]/L (ref 0–40)
Albumin: 4.2 g/dL (ref 3.9–4.9)
Alkaline Phosphatase: 78 [IU]/L (ref 44–121)
BUN/Creatinine Ratio: 11 — ABNORMAL LOW (ref 12–28)
BUN: 13 mg/dL (ref 8–27)
Bilirubin Total: 0.3 mg/dL (ref 0.0–1.2)
CO2: 22 mmol/L (ref 20–29)
Calcium: 9.7 mg/dL (ref 8.7–10.3)
Chloride: 99 mmol/L (ref 96–106)
Creatinine, Ser: 1.21 mg/dL — ABNORMAL HIGH (ref 0.57–1.00)
Globulin, Total: 2.6 g/dL (ref 1.5–4.5)
Glucose: 95 mg/dL (ref 70–99)
Potassium: 4.4 mmol/L (ref 3.5–5.2)
Sodium: 136 mmol/L (ref 134–144)
Total Protein: 6.8 g/dL (ref 6.0–8.5)
eGFR: 49 mL/min/{1.73_m2} — ABNORMAL LOW (ref >59)

## 2023-11-23 LAB — CBC WITH DIFFERENTIAL/PLATELET
Basophils Absolute: 0 10*3/uL (ref 0.0–0.2)
Basos: 0 %
EOS (ABSOLUTE): 0.2 10*3/uL (ref 0.0–0.4)
Eos: 2 %
Hematocrit: 43.3 % (ref 34.0–46.6)
Hemoglobin: 14.2 g/dL (ref 11.1–15.9)
Immature Grans (Abs): 0 10*3/uL (ref 0.0–0.1)
Immature Granulocytes: 0 %
Lymphocytes Absolute: 3.3 10*3/uL — ABNORMAL HIGH (ref 0.7–3.1)
Lymphs: 32 %
MCH: 32.7 pg (ref 26.6–33.0)
MCHC: 32.8 g/dL (ref 31.5–35.7)
MCV: 100 fL — ABNORMAL HIGH (ref 79–97)
Monocytes Absolute: 1.2 10*3/uL — ABNORMAL HIGH (ref 0.1–0.9)
Monocytes: 11 %
Neutrophils Absolute: 5.8 10*3/uL (ref 1.4–7.0)
Neutrophils: 55 %
Platelets: 305 10*3/uL (ref 150–450)
RBC: 4.34 x10E6/uL (ref 3.77–5.28)
RDW: 11.6 % — ABNORMAL LOW (ref 11.7–15.4)
WBC: 10.6 10*3/uL (ref 3.4–10.8)

## 2023-11-23 LAB — THYROID PANEL WITH TSH
Free Thyroxine Index: 1.8 (ref 1.2–4.9)
T3 Uptake Ratio: 25 % (ref 24–39)
T4, Total: 7.2 ug/dL (ref 4.5–12.0)
TSH: 1.98 u[IU]/mL (ref 0.450–4.500)

## 2023-11-23 LAB — VITAMIN D 25 HYDROXY (VIT D DEFICIENCY, FRACTURES): Vit D, 25-Hydroxy: 34 ng/mL (ref 30.0–100.0)

## 2023-11-23 LAB — VITAMIN B12: Vitamin B-12: 587 pg/mL (ref 232–1245)

## 2023-11-25 ENCOUNTER — Other Ambulatory Visit: Payer: Self-pay | Admitting: Family Medicine

## 2023-11-25 DIAGNOSIS — K589 Irritable bowel syndrome without diarrhea: Secondary | ICD-10-CM

## 2023-11-25 DIAGNOSIS — I10 Essential (primary) hypertension: Secondary | ICD-10-CM

## 2023-11-25 DIAGNOSIS — K219 Gastro-esophageal reflux disease without esophagitis: Secondary | ICD-10-CM

## 2023-12-21 ENCOUNTER — Other Ambulatory Visit (HOSPITAL_COMMUNITY): Payer: Medicare HMO

## 2023-12-27 ENCOUNTER — Other Ambulatory Visit: Payer: Medicare HMO | Admitting: Urology

## 2023-12-29 ENCOUNTER — Ambulatory Visit: Payer: Medicare HMO | Admitting: Family Medicine

## 2023-12-29 ENCOUNTER — Encounter: Payer: Self-pay | Admitting: Family Medicine

## 2023-12-29 VITALS — BP 136/77 | HR 71 | Temp 98.2°F | Ht 63.0 in | Wt 149.0 lb

## 2023-12-29 DIAGNOSIS — R6889 Other general symptoms and signs: Secondary | ICD-10-CM

## 2023-12-29 DIAGNOSIS — R051 Acute cough: Secondary | ICD-10-CM

## 2023-12-29 DIAGNOSIS — J029 Acute pharyngitis, unspecified: Secondary | ICD-10-CM | POA: Diagnosis not present

## 2023-12-29 LAB — VERITOR FLU A/B WAIVED
Influenza A: NEGATIVE
Influenza B: NEGATIVE

## 2023-12-29 LAB — RAPID STREP SCREEN (MED CTR MEBANE ONLY): Strep Gp A Ag, IA W/Reflex: NEGATIVE

## 2023-12-29 LAB — CULTURE, GROUP A STREP

## 2023-12-29 NOTE — Progress Notes (Addendum)
Subjective:  Patient ID: Sherry Salinas, female    DOB: 11/11/1956, 68 y.o.   MRN: [id number]  Patient Care Team: Sonny Masters, FNP as PCP - General (Family Medicine) Jena Gauss Gerrit Friends, MD as Consulting Physician (Gastroenterology) Danella Maiers, [facility] as [facility] Care Management (Pharmacist)   Chief Complaint:  flu/cold like symptoms (Sore throat/Left ear pain/Cough/)   HPI: Sherry Salinas is a 68 y.o. female presenting on 12/29/2023 for flu/cold like symptoms (Sore throat/Left ear pain/Cough/)   HPI 1. Flu-like symptoms States that granddaughter tested positive for Flu A this morning. Patient complains of symptoms that started this morning at 0200 with sore throat, left ear pain states that it radiates down her neck, occasional cough, PND. Denies fever, body aches, n/v. Endorses some chills. She has not been taking anything OTC.    Relevant past medical, surgical, family, and social history reviewed and updated as indicated.  Allergies and medications reviewed and updated. Data reviewed: Chart in Epic.   Past Medical History:  Diagnosis Date   Anxiety    Aortic atherosclerosis (HCC)    Arthritis    Hyperlipidemia    Hypertension    IBS (irritable bowel syndrome)    Osteopenia 01/08/2021   Prediabetes 01/18/2022   Vitamin D deficiency 09/14/2017    Past Surgical History:  Procedure Laterality Date   COLONOSCOPY  2015   Dr. Madilyn Fireman: per pt, diverticulosis, 3 polyps, come back in five years.    COLONOSCOPY WITH ESOPHAGOGASTRODUODENOSCOPY (EGD)  02/2003   Dr. Madilyn Fireman: Grade 3 esophagitis, hiatal hernia with patulous GE junction, mild duodenitis.  CLOtest, results unavailable.  Colonoscopy normal.   COLONOSCOPY WITH PROPOFOL N/A 06/23/2021   Procedure: COLONOSCOPY WITH PROPOFOL;  Surgeon: Lanelle Bal, DO;  Location: [facility];  Service: Endoscopy;  Laterality: N/A;  10:30am   POLYPECTOMY  06/23/2021   Procedure: POLYPECTOMY INTESTINAL;  Surgeon:  Lanelle Bal, DO;  Location: [facility];  Service: Endoscopy;;   REPLACEMENT TOTAL KNEE Right 2012   ROTATOR CUFF REPAIR Right     Social History   Socioeconomic History   Marital status: Married    Spouse name: Not on file   Number of children: 2   Years of education: Not on file   Highest education level: Associate degree: academic program  Occupational History   Occupation: Retired    Comment: Previously EMT, FF, and RN  Tobacco Use   Smoking status: Every Day    Current packs/day: 1.00    Average packs/day: 1 pack/day for 30.0 years (30.0 ttl pk-yrs)    Types: Cigarettes   Smokeless tobacco: Never  Vaping Use   Vaping status: Never Used  Substance and Sexual Activity   Alcohol use: Never   Drug use: Never   Sexual activity: Not on file  Other Topics Concern   Not on file  Social History Narrative   Lives with husband, son and granddaughter    Social Drivers of Health   Financial Resource Strain: Low Risk  (11/19/2023)   Overall Financial Resource Strain (CARDIA)    Difficulty of Paying Living Expenses: Not very hard  Food Insecurity: No Food Insecurity (11/19/2023)   Hunger Vital Sign    Worried About Running Out of Food in the Last Year: Never true    Ran Out of Food in the Last Year: Never true  Recent Concern: Food Insecurity - Food Insecurity Present (09/29/2023)   Hunger Vital Sign    Worried  About Running Out of Food in the Last Year: Sometimes true    Ran Out of Food in the Last Year: Never true  Transportation Needs: No Transportation Needs (11/19/2023)   PRAPARE - Administrator, Civil Service (Medical): No    Lack of Transportation (Non-Medical): No  Physical Activity: Insufficiently Active (11/19/2023)   Exercise Vital Sign    Days of Exercise per Week: 3 days    Minutes of Exercise per Session: 40 min  Stress: Stress Concern Present (11/19/2023)   [employer] of Occupational Health - Occupational Stress Questionnaire     Feeling of Stress : To some extent  Social Connections: Socially Integrated (11/19/2023)   Social Connection and Isolation Panel [NHANES]    Frequency of Communication with Friends and Family: More than three times a week    Frequency of Social Gatherings with Friends and Family: Twice a week    Attends Religious Services: More than 4 times per year    Active Member of [employer] or Organizations: No    Attends Engineer, structural: 1 to 4 times per year    Marital Status: Married  Catering manager Violence: Not At Risk (02/04/2023)   Humiliation, Afraid, Rape, and Kick questionnaire    Fear of Current or Ex-Partner: No    Emotionally Abused: No    Physically Abused: No    Sexually Abused: No    Outpatient Encounter Medications as of 12/29/2023  Medication Sig   amLODipine (NORVASC) 5 MG tablet TAKE 1 TABLET EVERY DAY   aspirin EC 81 MG tablet Take 81 mg by mouth daily. Swallow whole.   busPIRone (BUSPAR) 7.5 MG tablet TAKE 1 TABLET THREE TIMES DAILY   Calcium-Magnesium-Zinc (CAL-MAG-ZINC PO) Take 1 tablet by mouth daily.   cholecalciferol (VITAMIN D3) 25 MCG (1000 UNIT) tablet Take 2,000 Units by mouth daily.   dicyclomine (BENTYL) 20 MG tablet TAKE 1 TABLET THREE TIMES DAILY   EPINEPHrine 0.3 mg/0.3 mL IJ SOAJ injection Inject 0.3 mg into the muscle as needed for anaphylaxis.   ezetimibe (ZETIA) 10 MG tablet Take 1 tablet (10 mg total) by mouth daily.   hydrochlorothiazide (HYDRODIURIL) 25 MG tablet TAKE 1 TABLET EVERY DAY   levocetirizine (XYZAL) 5 MG tablet Take 1 tablet (5 mg total) by mouth every evening.   methocarbamol (ROBAXIN) 500 MG tablet Take 1 tablet (500 mg total) by mouth every 8 (eight) hours as needed for muscle spasms.   metoprolol succinate (TOPROL-XL) 50 MG 24 hr tablet TAKE 1 TABLET DAILY WITH OR IMMEDIATELY FOLLOWING A MEAL   mupirocin ointment (BACTROBAN) 2 % APPLY 1 APPLICATION TOPICALLY 2 (TWO) TIMES DAILY.   ondansetron (ZOFRAN) 4 MG tablet Take 1 tablet (4  mg total) by mouth every 8 (eight) hours as needed for nausea or vomiting.   pantoprazole (PROTONIX) 40 MG tablet TAKE 1 TABLET DAILY 30 MINUTES BEFORE BREAKFAST   pravastatin (PRAVACHOL) 10 MG tablet Take 1 tablet (10 mg total) by mouth daily.   prednisoLONE acetate (PRED FORTE) 1 % ophthalmic suspension Place 1 drop into both eyes every 4 (four) hours.   pyridOXINE (VITAMIN B6) 100 MG tablet Take 200 mg by mouth daily.   traMADol (ULTRAM) 50 MG tablet Take 1 tablet (50 mg total) by mouth 2 (two) times daily.   traMADol (ULTRAM) 50 MG tablet Take 1 tablet (50 mg total) by mouth 2 (two) times daily.   triamcinolone cream (KENALOG) 0.1 % APPLY TOPICALLY TWO TIMES DAILY AS NEEDED  No facility-administered encounter medications on file as of 12/29/2023.    Allergies  Allergen Reactions   Bee Venom Anaphylaxis   Codeine Anaphylaxis   Statins Other (See Comments)    Muscle aches   Anusol-Hc [Hydrocortisone]     Rash/itching, all hemorrhoid creams    Review of Systems As per HPI  Objective:  BP 136/77   Pulse 71   Temp 98.2 F (36.8 C)   Ht 5\' 3"  (1.6 m)   Wt 149 lb (67.6 kg)   SpO2 97%   BMI 26.39 kg/m    Wt Readings from Last 3 Encounters:  12/29/23 149 lb (67.6 kg)  11/22/23 148 lb 6.4 oz (67.3 kg)  10/21/23 145 lb (65.8 kg)   Physical Exam Constitutional:      General: She is awake. She is not in acute distress.    Appearance: Normal appearance. She is well-developed and well-groomed. She is not ill-appearing, toxic-appearing or diaphoretic.  HENT:     Right Ear: A middle ear effusion is present.     Left Ear: A middle ear effusion is present. Tympanic membrane is injected.     Nose: No congestion or rhinorrhea.     Right Sinus: No maxillary sinus tenderness or frontal sinus tenderness.     Left Sinus: Maxillary sinus tenderness and frontal sinus tenderness present.     Mouth/Throat:     Lips: Pink.     Mouth: Mucous membranes are moist.     Dentition: Normal  dentition.     Tongue: No lesions.     Palate: No mass.     Pharynx: Posterior oropharyngeal erythema and postnasal drip present. No pharyngeal swelling, oropharyngeal exudate or uvula swelling.     Tonsils: No tonsillar exudate or tonsillar abscesses.  Cardiovascular:     Rate and Rhythm: Normal rate and regular rhythm.     Pulses: Normal pulses.          Radial pulses are 2+ on the right side and 2+ on the left side.       Posterior tibial pulses are 2+ on the right side and 2+ on the left side.     Heart sounds: Normal heart sounds. No murmur heard.    No gallop.  Pulmonary:     Effort: Pulmonary effort is normal. No respiratory distress.     Breath sounds: Normal breath sounds. No stridor. No wheezing, rhonchi or rales.  Musculoskeletal:     Cervical back: Full passive range of motion without pain and neck supple.     Right lower leg: No edema.     Left lower leg: No edema.  Lymphadenopathy:     Head:     Right side of head: No submental, submandibular, tonsillar, preauricular or posterior auricular adenopathy.     Left side of head: No submental, submandibular, tonsillar, preauricular or posterior auricular adenopathy.     Cervical: Cervical adenopathy present.     Right cervical: No superficial or deep cervical adenopathy.    Left cervical: Superficial cervical adenopathy present. No deep cervical adenopathy.  Skin:    General: Skin is warm.     Capillary Refill: Capillary refill takes less than 2 seconds.  Neurological:     General: No focal deficit present.     Mental Status: She is alert, oriented to person, place, and time and easily aroused. Mental status is at baseline.     GCS: GCS eye subscore is 4. GCS verbal subscore is 5. GCS motor subscore is  6.     Motor: No weakness.  Psychiatric:        Attention and Perception: Attention and perception normal.        Mood and Affect: Mood and affect normal.        Speech: Speech normal.        Behavior: Behavior normal.  Behavior is cooperative.        Thought Content: Thought content normal. Thought content does not include homicidal or suicidal ideation. Thought content does not include homicidal or suicidal plan.        Cognition and Memory: Cognition and memory normal.        Judgment: Judgment normal.     Results for orders placed or performed in visit on 11/22/23  CBC with Differential/Platelet   Collection Time: 11/22/23 10:29 AM  Result Value Ref Range   WBC 10.6 3.4 - 10.8 x10E3/uL   RBC 4.34 3.77 - 5.28 x10E6/uL   Hemoglobin 14.2 11.1 - 15.9 g/dL   Hematocrit 78.2 95.6 - 46.6 %   MCV 100 (H) 79 - 97 fL   MCH 32.7 26.6 - 33.0 pg   MCHC 32.8 31.5 - 35.7 g/dL   RDW 21.3 (L) 08.6 - 57.8 %   Platelets 305 150 - 450 x10E3/uL   Neutrophils 55 Not Estab. %   Lymphs 32 Not Estab. %   Monocytes 11 Not Estab. %   Eos 2 Not Estab. %   Basos 0 Not Estab. %   Neutrophils Absolute 5.8 1.4 - 7.0 x10E3/uL   Lymphocytes Absolute 3.3 (H) 0.7 - 3.1 x10E3/uL   Monocytes Absolute 1.2 (H) 0.1 - 0.9 x10E3/uL   EOS (ABSOLUTE) 0.2 0.0 - 0.4 x10E3/uL   Basophils Absolute 0.0 0.0 - 0.2 x10E3/uL   Immature Granulocytes 0 Not Estab. %   Immature Grans (Abs) 0.0 0.0 - 0.1 x10E3/uL  CMP14+EGFR   Collection Time: 11/22/23 10:29 AM  Result Value Ref Range   Glucose 95 70 - 99 mg/dL   BUN 13 8 - 27 mg/dL   Creatinine, Ser 4.69 (H) 0.57 - 1.00 mg/dL   eGFR 49 (L) >62 XB/MWU/1.32   BUN/Creatinine Ratio 11 (L) 12 - 28   Sodium 136 134 - 144 mmol/L   Potassium 4.4 3.5 - 5.2 mmol/L   Chloride 99 96 - 106 mmol/L   CO2 22 20 - 29 mmol/L   Calcium 9.7 8.7 - 10.3 mg/dL   Total Protein 6.8 6.0 - 8.5 g/dL   Albumin 4.2 3.9 - 4.9 g/dL   Globulin, Total 2.6 1.5 - 4.5 g/dL   Bilirubin Total 0.3 0.0 - 1.2 mg/dL   Alkaline Phosphatase 78 44 - 121 IU/L   AST 25 0 - 40 IU/L   ALT 25 0 - 32 IU/L  Lipid panel   Collection Time: 11/22/23 10:29 AM  Result Value Ref Range   Cholesterol, Total 198 100 - 199 mg/dL    Triglycerides 440 (H) 0 - 149 mg/dL   HDL 37 (L) >10 mg/dL   VLDL Cholesterol Cal 36 5 - 40 mg/dL   LDL Chol Calc (NIH) 272 (H) 0 - 99 mg/dL   Chol/HDL Ratio 5.4 (H) 0.0 - 4.4 ratio  Thyroid Panel With TSH   Collection Time: 11/22/23 10:29 AM  Result Value Ref Range   TSH 1.980 0.450 - 4.500 uIU/mL   T4, Total 7.2 4.5 - 12.0 ug/dL   T3 Uptake Ratio 25 24 - 39 %   Free Thyroxine Index 1.8 1.2 -  4.9  VITAMIN D 25 Hydroxy (Vit-D Deficiency, Fractures)   Collection Time: 11/22/23 10:29 AM  Result Value Ref Range   Vit D, 25-Hydroxy 34.0 30.0 - 100.0 ng/mL  Vitamin B12   Collection Time: 11/22/23 10:29 AM  Result Value Ref Range   Vitamin B-12 587 232 - 1,245 pg/mL       08/19/2023   10:31 AM 06/27/2023    9:07 AM 03/07/2023   12:23 PM 02/04/2023    2:34 PM 01/13/2023    9:19 AM  Depression screen PHQ 2/9  Decreased Interest 1 0 1 0 1  Down, Depressed, Hopeless 0 0 1 0 1  PHQ - 2 Score 1 0 2 0 2  Altered sleeping 2 1 3  0 3  Tired, decreased energy 3 1 2  0 3  Change in appetite 0 0 0 0 0  Feeling bad or failure about yourself  0 0 0 0 0  Trouble concentrating 2 1 2  0 2  Moving slowly or fidgety/restless 0 0 0 0 0  Suicidal thoughts 0 0 0 0 0  PHQ-9 Score 8 3 9  0 10  Difficult doing work/chores Not difficult at all Not difficult at all Very difficult Not difficult at all Not difficult at all       08/19/2023   10:31 AM 06/27/2023    9:07 AM 03/07/2023   12:24 PM 01/13/2023    9:19 AM  GAD 7 : Generalized Anxiety Score  Nervous, Anxious, on Edge 1 0 1 2  Control/stop worrying 1 0 1 2  Worry too much - different things 1 1 1 2   Trouble relaxing 1 1 1 2   Restless 1 1 1 2   Easily annoyed or irritable 1 1 1 1   Afraid - awful might happen 0 1 1 0  Total GAD 7 Score 6 5 7 11   Anxiety Difficulty Not difficult at all Somewhat difficult Not difficult at all Not difficult at all   Pertinent labs & imaging results that were available during my care of the patient were reviewed by me and  considered in my medical decision making.  Assessment & Plan:  Sharlette was seen today for flu/cold like symptoms.  Diagnoses and all orders for this visit:  1. Flu-like symptoms Negative for rapid strep and flu in office. Results discussed with patient. Will complete triple swab as below for Covid and RSV. Discussed that given etiology, likely viral infection. Discussed at home therapies of tea, honey, nedi-pot, force fluids. Will await results of triple swab to determine next steps.  - Veritor Flu A/B Waived - COVID-19, Flu A+B and RSV  2. Sore throat As above.  - Rapid Strep Screen ([facility] ONLY) - Culture, Group A Strep; Future - Culture, Group A Strep  3. Acute cough Negative for rapid strep and flu in office. Results discussed with patient. Will complete triple swab as below for Covid and RSV. Discussed that given etiology, likely viral infection. Discussed at home therapies of tea, honey, nedi-pot, force fluids. Will await results of triple swab to determine next steps.  - promethazine-dextromethorphan (PROMETHAZINE-DM) 6.25-15 MG/5ML syrup; Take 5 mLs by mouth 4 (four) times daily as needed for cough.  Dispense: 118 mL; Refill: 0 - albuterol (VENTOLIN HFA) 108 (90 Base) MCG/ACT inhaler; Inhale 2 puffs into the lungs every 6 (six) hours as needed for wheezing or shortness of breath.  Dispense: 8 g; Refill: 0 - benzonatate (TESSALON PERLES) 100 MG capsule; Take 1 capsule (100 mg  total) by mouth 3 (three) times daily as needed.  Dispense: 20 capsule; Refill: 0   Continue all other maintenance medications.  Follow up plan: Return if symptoms worsen or fail to improve.   Continue healthy lifestyle choices, including diet (rich in fruits, vegetables, and lean proteins, and low in salt and simple carbohydrates) and exercise (at least 30 minutes of moderate physical activity daily).  Written and verbal instructions provided   The above assessment and management plan was  discussed with the patient. The patient verbalized understanding of and has agreed to the management plan. Patient is aware to call the clinic if they develop any new symptoms or if symptoms persist or worsen. Patient is aware when to return to the clinic for a follow-up visit. Patient educated on when it is appropriate to go to the emergency department.   Neale Burly, DNP-FNP [facility] [address] [place], [place] [address] [phone]

## 2023-12-29 NOTE — Patient Instructions (Signed)

## 2023-12-30 ENCOUNTER — Encounter: Payer: Self-pay | Admitting: Family Medicine

## 2023-12-30 LAB — COVID-19, FLU A+B AND RSV
Influenza A, NAA: NOT DETECTED
Influenza B, NAA: NOT DETECTED
RSV, NAA: NOT DETECTED
SARS-CoV-2, NAA: NOT DETECTED

## 2023-12-30 MED ORDER — ALBUTEROL SULFATE HFA 108 (90 BASE) MCG/ACT IN AERS: 2.0000 | INHALATION_SPRAY | Freq: Four times a day (QID) | RESPIRATORY_TRACT | 0 refills | Status: AC | PRN

## 2023-12-30 MED ORDER — PROMETHAZINE-DM 6.25-15 MG/5ML PO SYRP: 5.0000 mL | ORAL_SOLUTION | Freq: Four times a day (QID) | ORAL | 0 refills | Status: DC | PRN

## 2023-12-30 MED ORDER — BENZONATATE 100 MG PO CAPS: 100.0000 mg | ORAL_CAPSULE | Freq: Three times a day (TID) | ORAL | 0 refills | Status: DC | PRN

## 2023-12-30 NOTE — Addendum Note (Signed)
Addended by: Neale Burly on: 12/30/2023 08:03 AM   Modules accepted: Orders

## 2023-12-31 LAB — CULTURE, GROUP A STREP: Strep A Culture: NEGATIVE

## 2024-01-02 ENCOUNTER — Encounter: Payer: Self-pay | Admitting: Family Medicine

## 2024-01-03 ENCOUNTER — Encounter: Payer: Self-pay | Admitting: Family Medicine

## 2024-01-03 NOTE — Progress Notes (Signed)
Negative for all tests. Please follow up if symptoms continue.

## 2024-01-10 DIAGNOSIS — J069 Acute upper respiratory infection, unspecified: Secondary | ICD-10-CM | POA: Diagnosis not present

## 2024-01-22 ENCOUNTER — Other Ambulatory Visit: Payer: Self-pay | Admitting: Family Medicine

## 2024-01-22 DIAGNOSIS — I10 Essential (primary) hypertension: Secondary | ICD-10-CM

## 2024-01-22 DIAGNOSIS — F411 Generalized anxiety disorder: Secondary | ICD-10-CM

## 2024-02-06 ENCOUNTER — Other Ambulatory Visit: Payer: Self-pay | Admitting: Family Medicine

## 2024-02-06 DIAGNOSIS — K219 Gastro-esophageal reflux disease without esophagitis: Secondary | ICD-10-CM

## 2024-02-06 DIAGNOSIS — K589 Irritable bowel syndrome without diarrhea: Secondary | ICD-10-CM

## 2024-02-06 DIAGNOSIS — I10 Essential (primary) hypertension: Secondary | ICD-10-CM

## 2024-02-07 ENCOUNTER — Ambulatory Visit: Payer: Medicare HMO

## 2024-02-07 VITALS — Ht 63.0 in | Wt 149.0 lb

## 2024-02-07 DIAGNOSIS — Z0000 Encounter for general adult medical examination without abnormal findings: Secondary | ICD-10-CM | POA: Diagnosis not present

## 2024-02-07 NOTE — Progress Notes (Signed)
 Subjective:   Sherry Salinas is a 68 y.o. who presents for a Medicare Wellness preventive visit.  Visit Complete: Virtual I connected with  Sherry Salinas on 02/07/24 by a audio enabled telemedicine application and verified that I am speaking with the correct person using two identifiers.  Patient Location: Home  Provider Location: Home Office  I discussed the limitations of evaluation and management by telemedicine. The patient expressed understanding and agreed to proceed.  Vital Signs: Because this visit was a virtual/telehealth visit, some criteria may be missing or patient reported. Any vitals not documented were not able to be obtained and vitals that have been documented are patient reported.  VideoDeclined- This patient declined Librarian, academic. Therefore the visit was completed with audio only.  AWV Questionnaire: Yes: Patient Medicare AWV questionnaire was completed by the patient on 02/03/24; I have confirmed that all information answered by patient is correct and no changes since this date.  Cardiac Risk Factors include: advanced age (>36men, >42 women);smoking/ tobacco exposure     Objective:    Today's Vitals   02/07/24 1008  Weight: 149 lb (67.6 kg)  Height: 5\' 3"  (1.6 m)   Body mass index is 26.39 kg/m.     02/07/2024    5:04 PM 02/04/2023    2:35 PM 06/23/2021    7:06 AM 02/03/2021   10:43 AM  Advanced Directives  Does Patient Have a Medical Advance Directive? No Yes No Yes  Type of Special educational needs teacher of [place];Living will  Living will;Healthcare Power of Attorney  Does patient want to make changes to medical advance directive?    No - Patient declined  Copy of Healthcare Power of Attorney in Chart?  No - copy requested  No - copy requested  Would patient like information on creating a medical advance directive? Yes (MAU/Ambulatory/Procedural Areas - Information given)  No - Patient declined     Current  Medications (verified) Outpatient Encounter Medications as of 02/07/2024  Medication Sig   albuterol (VENTOLIN HFA) 108 (90 Base) MCG/ACT inhaler Inhale 2 puffs into the lungs every 6 (six) hours as needed for wheezing or shortness of breath.   amLODipine (NORVASC) 5 MG tablet TAKE 1 TABLET EVERY DAY   aspirin EC 81 MG tablet Take 81 mg by mouth daily. Swallow whole.   benzonatate (TESSALON PERLES) 100 MG capsule Take 1 capsule (100 mg total) by mouth 3 (three) times daily as needed.   busPIRone (BUSPAR) 7.5 MG tablet TAKE 1 TABLET THREE TIMES DAILY   Calcium-Magnesium-Zinc (CAL-MAG-ZINC PO) Take 1 tablet by mouth daily.   cholecalciferol (VITAMIN D3) 25 MCG (1000 UNIT) tablet Take 2,000 Units by mouth daily.   dicyclomine (BENTYL) 20 MG tablet TAKE 1 TABLET THREE TIMES DAILY   EPINEPHrine 0.3 mg/0.3 mL IJ SOAJ injection Inject 0.3 mg into the muscle as needed for anaphylaxis.   ezetimibe (ZETIA) 10 MG tablet Take 1 tablet (10 mg total) by mouth daily.   hydrochlorothiazide (HYDRODIURIL) 25 MG tablet TAKE 1 TABLET EVERY DAY   levocetirizine (XYZAL) 5 MG tablet Take 1 tablet (5 mg total) by mouth every evening.   methocarbamol (ROBAXIN) 500 MG tablet Take 1 tablet (500 mg total) by mouth every 8 (eight) hours as needed for muscle spasms.   metoprolol succinate (TOPROL-XL) 50 MG 24 hr tablet TAKE 1 TABLET DAILY WITH OR IMMEDIATELY FOLLOWING A MEAL   mupirocin ointment (BACTROBAN) 2 % APPLY 1 APPLICATION TOPICALLY 2 (TWO) TIMES DAILY.  ondansetron (ZOFRAN) 4 MG tablet Take 1 tablet (4 mg total) by mouth every 8 (eight) hours as needed for nausea or vomiting.   pantoprazole (PROTONIX) 40 MG tablet TAKE 1 TABLET DAILY 30 MINUTES BEFORE BREAKFAST   pravastatin (PRAVACHOL) 10 MG tablet Take 1 tablet (10 mg total) by mouth daily.   prednisoLONE acetate (PRED FORTE) 1 % ophthalmic suspension Place 1 drop into both eyes every 4 (four) hours.   promethazine-dextromethorphan (PROMETHAZINE-DM) 6.25-15 MG/5ML  syrup Take 5 mLs by mouth 4 (four) times daily as needed for cough.   pyridOXINE (VITAMIN B6) 100 MG tablet Take 200 mg by mouth daily.   traMADol (ULTRAM) 50 MG tablet Take 1 tablet (50 mg total) by mouth 2 (two) times daily.   traMADol (ULTRAM) 50 MG tablet Take 1 tablet (50 mg total) by mouth 2 (two) times daily.   triamcinolone cream (KENALOG) 0.1 % APPLY TOPICALLY TWO TIMES DAILY AS NEEDED   No facility-administered encounter medications on file as of 02/07/2024.    Allergies (verified) Bee venom, Codeine, Statins, and Anusol-hc [hydrocortisone]   History: Past Medical History:  Diagnosis Date   Anxiety    Aortic atherosclerosis (HCC)    Arthritis    Hyperlipidemia    Hypertension    IBS (irritable bowel syndrome)    Osteopenia 01/08/2021   Prediabetes 01/18/2022   Vitamin D deficiency 09/14/2017   Past Surgical History:  Procedure Laterality Date   COLONOSCOPY  2015   Dr. Madilyn Fireman: per pt, diverticulosis, 3 polyps, come back in five years.    COLONOSCOPY WITH ESOPHAGOGASTRODUODENOSCOPY (EGD)  02/2003   Dr. Madilyn Fireman: Grade 3 esophagitis, hiatal hernia with patulous GE junction, mild duodenitis.  CLOtest, results unavailable.  Colonoscopy normal.   COLONOSCOPY WITH PROPOFOL N/A 06/23/2021   Procedure: COLONOSCOPY WITH PROPOFOL;  Surgeon: Lanelle Bal, DO;  [facility]: [facility];  Service: Endoscopy;  Laterality: N/A;  10:30am   POLYPECTOMY  06/23/2021   Procedure: POLYPECTOMY INTESTINAL;  Surgeon: Lanelle Bal, DO;  Location: [facility];  Service: Endoscopy;;   REPLACEMENT TOTAL KNEE Right 2012   ROTATOR CUFF REPAIR Right    Family History  Problem Relation Age of Onset   Arthritis Mother    Kidney failure Mother    Rheum arthritis Mother    Stroke Father    Heart disease Father    Heart attack Father    Stroke Maternal Grandfather    Early death Paternal Grandmother        Childbirth   Early death Paternal Grandfather        MVA   Colon cancer Paternal  Uncle    Other Brother        Scientific laboratory technician syndrome   Social History   Socioeconomic History   Marital status: Married    Spouse name: Not on file   Number of children: 2   Years of education: Not on file   Highest education level: Associate degree: academic program  Occupational History   Occupation: Retired    Comment: Previously EMT, FF, and RN  Tobacco Use   Smoking status: Every Day    Current packs/day: 1.00    Average packs/day: 1 pack/day for 30.0 years (30.0 ttl pk-yrs)    Types: Cigarettes   Smokeless tobacco: Never  Vaping Use   Vaping status: Never Used  Substance and Sexual Activity   Alcohol use: Never   Drug use: Never   Sexual activity: Not on file  Other Topics Concern  Not on file  Social History Narrative   Lives with husband, son and granddaughter    Social Drivers of Health   Financial Resource Strain: Low Risk  (02/07/2024)   Overall Financial Resource Strain (CARDIA)    Difficulty of Paying Living Expenses: Not very hard  Food Insecurity: No Food Insecurity (02/07/2024)   Hunger Vital Sign    Worried About Running Out of Food in the Last Year: Never true    Ran Out of Food in the Last Year: Never true  Transportation Needs: No Transportation Needs (02/07/2024)   PRAPARE - Administrator, Civil Service (Medical): No    Lack of Transportation (Non-Medical): No  Physical Activity: Insufficiently Active (02/07/2024)   Exercise Vital Sign    Days of Exercise per Week: 3 days    Minutes of Exercise per Session: 40 min  Stress: No Stress Concern Present (02/07/2024)   [employer] of Occupational Health - Occupational Stress Questionnaire    Feeling of Stress : Not at all  Recent Concern: Stress - Stress Concern Present (11/19/2023)   [employer] of Occupational Health - Occupational Stress Questionnaire    Feeling of Stress : To some extent  Social Connections: Moderately Integrated (02/07/2024)   Social Connection and Isolation  Panel [NHANES]    Frequency of Communication with Friends and Family: More than three times a week    Frequency of Social Gatherings with Friends and Family: Twice a week    Attends Religious Services: More than 4 times per year    Active Member of [employer] or Organizations: No    Attends Engineer, structural: Never    Marital Status: Married    Tobacco Counseling Ready to quit: No Counseling given: Not Answered    Clinical Intake:  Pre-visit preparation completed: Yes  Pain : No/denies pain     Diabetes: No  How often do you need to have someone help you when you read instructions, pamphlets, or other written materials from your doctor or pharmacy?: 1 - Never  Interpreter Needed?: No  Information entered by :: Kandis Fantasia LPN   Activities of Daily Living     02/03/2024    2:53 PM  In your present state of health, do you have any difficulty performing the following activities:  Hearing? 0  Vision? 0  Difficulty concentrating or making decisions? 0  Walking or climbing stairs? 0  Dressing or bathing? 0  Doing errands, shopping? 0  Preparing Food and eating ? N  Using the Toilet? N  In the past six months, have you accidently leaked urine? N  Do you have problems with loss of bowel control? N  Managing your Medications? N  Managing your Finances? N  Housekeeping or managing your Housekeeping? N    Patient Care Team: Sonny Masters, FNP as PCP - General (Family Medicine) Jena Gauss Gerrit Friends, MD as Consulting Physician (Gastroenterology) Danella Maiers, [facility] as [facility] Care Management (Pharmacist) [facility]  Indicate any recent Medical Services you may have received from other than [facility] providers in the past year (date may be approximate).     Assessment:   This is a routine wellness examination for Sherry Salinas.  Hearing/Vision screen Hearing Screening - Comments:: Denies hearing difficulties   Vision  Screening - Comments:: Wears rx glasses - up to date with routine eye exams with [facility]. [facility] Forster     Goals Addressed   None    Depression Screen  02/07/2024    5:00 PM 12/29/2023   10:44 AM 08/19/2023   10:31 AM 06/27/2023    9:07 AM 03/07/2023   12:23 PM 02/04/2023    2:34 PM 01/13/2023    9:19 AM  PHQ 2/9 Scores  PHQ - 2 Score 0 0 1 0 2 0 2  PHQ- 9 Score 3 3 8 3 9  0 10    Fall Risk     02/07/2024    5:03 PM 02/03/2024    2:53 PM 12/29/2023   10:44 AM 08/19/2023   10:31 AM 06/27/2023    9:07 AM  Fall Risk   Falls in the past year? 0 0 0 0 0  Number falls in past yr: 0  0  0  Injury with Fall? 0  0  0  Risk for fall due to : No Fall Risks  No Fall Risks  No Fall Risks  Follow up Falls prevention discussed;Education provided;Falls evaluation completed  Falls evaluation completed  Falls evaluation completed    MEDICARE RISK AT HOME:  Medicare Risk at Home Any stairs in or around the home?: (Patient-Rptd) Yes If so, are there any without handrails?: (Patient-Rptd) No Home free of loose throw rugs in walkways, pet beds, electrical cords, etc?: (Patient-Rptd) Yes Adequate lighting in your home to reduce risk of falls?: (Patient-Rptd) Yes Life alert?: (Patient-Rptd) No Use of a cane, walker or w/c?: (Patient-Rptd) No Grab bars in the bathroom?: (Patient-Rptd) No Shower chair or bench in shower?: (Patient-Rptd) No Elevated toilet seat or a handicapped toilet?: (Patient-Rptd) No  TIMED UP AND GO:  Was the test performed?  No  Cognitive Function: 6CIT completed        02/07/2024    5:04 PM 02/04/2023    2:36 PM 02/03/2021   10:45 AM  6CIT Screen  What Year? 0 points 0 points 0 points  What month? 0 points 0 points 0 points  What time? 0 points 0 points 0 points  Count back from 20 0 points 0 points 0 points  Months in reverse 0 points 0 points 0 points  Repeat phrase 0 points 0 points 0 points  Total Score 0 points 0 points 0 points    Immunizations Immunization History   Administered Date(s) Administered   Influenza Inj Mdck Quad Pf 10/21/2016, 09/14/2017   Influenza Split 08/29/2015, 09/13/2018   Influenza, Seasonal, Injecte, Preservative Fre 09/24/2011, 08/13/2013, 01/27/2015, 09/13/2018   Influenza,inj,Quad PF,6+ Mos 09/13/2018, 10/28/2019, 10/06/2020   Influenza,inj,quad, With Preservative 10/21/2016, 09/14/2017, 09/13/2018   Influenza,trivalent, recombinat, inj, PF 09/24/2011, 08/13/2013, 01/27/2015   Influenza-Unspecified 10/21/2016, 09/14/2017, 09/13/2018   PFIZER(Purple Top)SARS-COV-2 Vaccination 07/10/2020, 07/31/2020   PPD Test 11/12/2013, 01/27/2015   Pneumococcal Conjugate-13 07/15/2021   Tdap 12/06/2006, 10/21/2016    Screening Tests Health Maintenance  Topic Date Due   COVID-19 Vaccine (3 - Pfizer risk series) 08/28/2020   DEXA SCAN  01/07/2023   Zoster Vaccines- Shingrix (1 of 2) 02/20/2024 (Originally 05/12/1975)   INFLUENZA VACCINE  03/05/2024 (Originally 07/07/2023)   Lung Cancer Screening  03/06/2024 (Originally 05/12/2005)   Pneumonia Vaccine 47+ Years old (2 of 2 - PPSV23 or PCV20) 08/18/2024 (Originally 09/09/2021)   Medicare Annual Wellness (AWV)  02/06/2025   Colonoscopy  06/23/2026   DTaP/Tdap/Td (3 - Td or Tdap) 10/21/2026   Hepatitis C Screening  Completed   HPV VACCINES  Aged Out   Fecal DNA (Cologuard)  Discontinued    Health Maintenance  Health Maintenance Due  Topic Date Due   COVID-19 Vaccine (  3 - Pfizer risk series) 08/28/2020   DEXA SCAN  01/07/2023   Health Maintenance Items Addressed: Patient declines vaccines and screenings at this time   Additional Screening:  Vision Screening: Recommended annual ophthalmology exams for early detection of glaucoma and other disorders of the eye.  Dental Screening: Recommended annual dental exams for proper oral hygiene  Community Resource Referral / Chronic Care Management: CRR required this visit?  No   CCM required this visit?  No     Plan:     I have  personally reviewed and noted the following in the patient's chart:   Medical and social history Use of alcohol, tobacco or illicit drugs  Current medications and supplements including opioid prescriptions. Patient is not currently taking opioid prescriptions. Functional ability and status Nutritional status Physical activity Advanced directives List of other physicians Hospitalizations, surgeries, and ER visits in previous 12 months Vitals Screenings to include cognitive, depression, and falls Referrals and appointments  In addition, I have reviewed and discussed with patient certain preventive protocols, quality metrics, and best practice recommendations. A written personalized care plan for preventive services as well as general preventive health recommendations were provided to patient.     Kandis Fantasia [place], [place]   12/11/1094   After Visit Summary: (MyChart) Due to this being a telephonic visit, the after visit summary with patients personalized plan was offered to patient via MyChart   Notes: Nothing significant to report at this time.

## 2024-02-07 NOTE — Patient Instructions (Signed)
 Ms. Sherry Salinas , Thank you for taking time to come for your Medicare Wellness Visit. I appreciate your ongoing commitment to your health goals. Please review the following plan we discussed and let me know if I can assist you in the future.   Referrals/Orders/Follow-Ups/Clinician Recommendations: Aim for 30 minutes of exercise or brisk walking, 6-8 glasses of water, and 5 servings of fruits and vegetables each day.  This is a list of the screening recommended for you and due dates:  Health Maintenance  Topic Date Due   COVID-19 Vaccine (3 - Pfizer risk series) 08/28/2020   DEXA scan (bone density measurement)  01/07/2023   Zoster (Shingles) Vaccine (1 of 2) 02/20/2024*   Flu Shot  03/05/2024*   Screening for Lung Cancer  03/06/2024*   Pneumonia Vaccine (2 of 2 - PPSV23 or PCV20) 08/18/2024*   Medicare Annual Wellness Visit  02/06/2025   Colon Cancer Screening  06/23/2026   DTaP/Tdap/Td vaccine (3 - Td or Tdap) 10/21/2026   Hepatitis C Screening  Completed   HPV Vaccine  Aged Out   Cologuard (Stool DNA test)  Discontinued  *Topic was postponed. The date shown is not the original due date.    Advanced directives: (ACP Link)Information on Advanced Care Planning can be found at [facility] Advance Health Care Directives Advance Health Care Directives ([email or web address]   Next Medicare Annual Wellness Visit scheduled for next year: Yes

## 2024-02-17 ENCOUNTER — Encounter: Payer: Self-pay | Admitting: Family Medicine

## 2024-02-17 ENCOUNTER — Ambulatory Visit: Payer: Medicare HMO | Admitting: Family Medicine

## 2024-02-17 ENCOUNTER — Other Ambulatory Visit: Payer: Medicare HMO

## 2024-02-17 VITALS — BP 151/84 | HR 61 | Temp 97.4°F | Ht 63.0 in | Wt 142.2 lb

## 2024-02-17 DIAGNOSIS — G8929 Other chronic pain: Secondary | ICD-10-CM

## 2024-02-17 DIAGNOSIS — N898 Other specified noninflammatory disorders of vagina: Secondary | ICD-10-CM

## 2024-02-17 DIAGNOSIS — J189 Pneumonia, unspecified organism: Secondary | ICD-10-CM | POA: Diagnosis not present

## 2024-02-17 DIAGNOSIS — R300 Dysuria: Secondary | ICD-10-CM

## 2024-02-17 DIAGNOSIS — Z79899 Other long term (current) drug therapy: Secondary | ICD-10-CM | POA: Diagnosis not present

## 2024-02-17 DIAGNOSIS — M25512 Pain in left shoulder: Secondary | ICD-10-CM

## 2024-02-17 DIAGNOSIS — M25551 Pain in right hip: Secondary | ICD-10-CM | POA: Diagnosis not present

## 2024-02-17 DIAGNOSIS — M6289 Other specified disorders of muscle: Secondary | ICD-10-CM | POA: Diagnosis not present

## 2024-02-17 DIAGNOSIS — M25561 Pain in right knee: Secondary | ICD-10-CM

## 2024-02-17 DIAGNOSIS — R062 Wheezing: Secondary | ICD-10-CM

## 2024-02-17 LAB — URINALYSIS, ROUTINE W REFLEX MICROSCOPIC
Bilirubin, UA: NEGATIVE
Glucose, UA: NEGATIVE
Ketones, UA: NEGATIVE
Nitrite, UA: NEGATIVE
Protein,UA: NEGATIVE
RBC, UA: NEGATIVE
Specific Gravity, UA: <1.005 — ABNORMAL LOW (ref 1.005–1.030)
Urobilinogen, Ur: 0.2 mg/dL (ref 0.2–1.0)
pH, UA: 6 (ref 5.0–7.5)

## 2024-02-17 LAB — MICROSCOPIC EXAMINATION
RBC, Urine: NONE SEEN /HPF (ref 0–2)
Renal Epithel, UA: NONE SEEN /HPF
Yeast, UA: NONE SEEN

## 2024-02-17 LAB — WET PREP FOR TRICH, YEAST, CLUE
Clue Cell Exam: NEGATIVE
Trichomonas Exam: NEGATIVE
Yeast Exam: NEGATIVE

## 2024-02-17 MED ORDER — TRAMADOL HCL 50 MG PO TABS
50.0000 mg | ORAL_TABLET | Freq: Two times a day (BID) | ORAL | 0 refills | Status: DC
Start: 2024-04-17 — End: 2024-09-20

## 2024-02-17 MED ORDER — AZITHROMYCIN 250 MG PO TABS: ORAL_TABLET | ORAL | 0 refills | Status: DC

## 2024-02-17 MED ORDER — TRAMADOL HCL 50 MG PO TABS
50.0000 mg | ORAL_TABLET | Freq: Two times a day (BID) | ORAL | 0 refills | Status: DC
Start: 2024-03-18 — End: 2024-09-20

## 2024-02-17 MED ORDER — TRAMADOL HCL 50 MG PO TABS: 50.0000 mg | ORAL_TABLET | Freq: Two times a day (BID) | ORAL | 0 refills | Status: DC

## 2024-02-17 MED ORDER — FLUCONAZOLE 150 MG PO TABS: 150.0000 mg | ORAL_TABLET | Freq: Once | ORAL | 1 refills | Status: AC

## 2024-02-17 MED ORDER — ALBUTEROL SULFATE HFA 108 (90 BASE) MCG/ACT IN AERS: 2.0000 | INHALATION_SPRAY | Freq: Four times a day (QID) | RESPIRATORY_TRACT | 2 refills | Status: DC | PRN

## 2024-02-17 MED ORDER — AMOXICILLIN-POT CLAVULANATE 875-125 MG PO TABS: 1.0000 | ORAL_TABLET | Freq: Two times a day (BID) | ORAL | 0 refills | Status: AC

## 2024-02-17 MED ORDER — PREDNISONE 20 MG PO TABS
40.0000 mg | ORAL_TABLET | Freq: Every day | ORAL | 0 refills | Status: AC
Start: 2024-02-17 — End: 2024-02-22

## 2024-02-17 NOTE — Progress Notes (Signed)
 Subjective:  Patient ID: Sherry Salinas, female    DOB: 23-Jul-1956, 68 y.o.   MRN: [id number]  Patient Care Team: Sonny Masters, FNP as PCP - General (Family Medicine) Jena Gauss, Gerrit Friends, MD as Consulting Physician (Gastroenterology) Danella Maiers, [facility] as [facility] Care Management (Pharmacist) [facility], [place]   Chief Complaint:  Pain Management, Dysuria (X 1 week ), and Vaginal Itching (After finished last abx )   HPI: Sherry Salinas is a 68 y.o. female presenting on 02/17/2024 for Pain Management, Dysuria (X 1 week ), and Vaginal Itching (After finished last abx )   Discussed the use of AI scribe software for clinical note transcription with the patient, who gave verbal consent to proceed.  History of Present Illness   Sherry Salinas is a 68 year old female who presents with symptoms of a urinary tract infection and ongoing pain management issues.  She has been experiencing symptoms suggestive of a urinary tract infection for the past week, including a burning sensation, itching, and a strong odor in her urine. She initially considered calling for an appointment but decided to wait until her scheduled visit today. She describes a persistent feeling of malaise, stating she feels bad upon waking and going to bed, and never feels good throughout the day.  She recently recovered from a severe flu and had a respiratory infection prior to that, for which she took antibiotics. She believes the antibiotics may have led to a yeast infection, as she is experiencing vaginal itching and irritation, though she denies any discharge.  For the past week, she has been experiencing wheezing and a persistent cough, which she attributes to seasonal changes and possibly lingering effects from the flu. She mentions feeling anxious and describes her 'insides tremble'. She has an albuterol inhaler at home, though she is unsure of the remaining quantity.  Regarding  her chronic pain, her medications, including tramadol, are effective, reducing her pain from a 7 out of 10 to a 2 or 3 out of 10. However, she experiences limitations in her ability to walk due to burning and muscle pain in her thighs and hips, particularly on the left side. These symptoms have been ongoing for several months. She also notes her feet often feel cold.      Pain assessment: Cause of pain- chronic arthritis pain Pain location- shoulder, hip, knee Pain on scale of 1-10- 7/10 without meds, 2/10 with meds Frequency- daily What increases pain-activity What makes pain Better-rest and medications  Effects on ADL - minimal Any change in general medical condition-none  Current opioids rx- Tramadol 50 mg twice daily # meds rx- 60 Effectiveness of current meds-good Adverse reactions from pain meds-none Morphine equivalent- 10 MME/DAY  Pill count performed-No Last drug screen - 01/2023 ( high risk q66m, moderate risk q57m, low risk yearly ) Urine drug screen today- Yes Was the N[facility]SR reviewed- yes  If yes were their any concerning findings? - none   Overdose risk: low    01/13/2023    9:46 PM  Opioid Risk   Alcohol 0  Illegal Drugs 0  Rx Drugs 0  Alcohol 0  Illegal Drugs 0  Rx Drugs 0  Age between 16-45 years  0  History of Preadolescent Sexual Abuse 0  Psychological Disease 2  ADD Negative  OCD Negative  Bipolar Negative  Depression 0  Opioid Risk Tool Scoring 2  Opioid Risk Interpretation Low Risk  Pain contract signed on: 02/17/2024     Relevant past medical, surgical, family, and social history reviewed and updated as indicated.  Allergies and medications reviewed and updated. Data reviewed: Chart in Epic.   Past Medical History:  Diagnosis Date   Anxiety    Aortic atherosclerosis (HCC)    Arthritis    Hyperlipidemia    Hypertension    IBS (irritable bowel syndrome)    Osteopenia 01/08/2021   Prediabetes 01/18/2022   Vitamin D deficiency  09/14/2017    Past Surgical History:  Procedure Laterality Date   COLONOSCOPY  2015   Dr. Madilyn Fireman: per pt, diverticulosis, 3 polyps, come back in five years.    COLONOSCOPY WITH ESOPHAGOGASTRODUODENOSCOPY (EGD)  02/2003   Dr. Madilyn Fireman: Grade 3 esophagitis, hiatal hernia with patulous GE junction, mild duodenitis.  CLOtest, results unavailable.  Colonoscopy normal.   COLONOSCOPY WITH PROPOFOL N/A 06/23/2021   Procedure: COLONOSCOPY WITH PROPOFOL;  Surgeon: Lanelle Bal, DO;  [facility];  Service: Endoscopy;  Laterality: N/A;  10:30am   POLYPECTOMY  06/23/2021   Procedure: POLYPECTOMY INTESTINAL;  Surgeon: Lanelle Bal, DO;  Location: [facility];  Service: Endoscopy;;   REPLACEMENT TOTAL KNEE Right 2012   ROTATOR CUFF REPAIR Right     Social History   Socioeconomic History   Marital status: Married    Spouse name: Not on file   Number of children: 2   Years of education: Not on file   Highest education level: Associate degree: academic program  Occupational History   Occupation: Retired    Comment: Previously EMT, FF, and RN  Tobacco Use   Smoking status: Every Day    Current packs/day: 1.00    Average packs/day: 1 pack/day for 30.0 years (30.0 ttl pk-yrs)    Types: Cigarettes   Smokeless tobacco: Never  Vaping Use   Vaping status: Never Used  Substance and Sexual Activity   Alcohol use: Never   Drug use: Never   Sexual activity: Not on file  Other Topics Concern   Not on file  Social History Narrative   Lives with husband, son and granddaughter    Social Drivers of Health   Financial Resource Strain: Low Risk  (02/07/2024)   Overall Financial Resource Strain (CARDIA)    Difficulty of Paying Living Expenses: Not very hard  Food Insecurity: No Food Insecurity (02/07/2024)   Hunger Vital Sign    Worried About Running Out of Food in the Last Year: Never true    Ran Out of Food in the Last Year: Never true  Transportation Needs: No Transportation Needs  (02/07/2024)   PRAPARE - Administrator, Civil Service (Medical): No    Lack of Transportation (Non-Medical): No  Physical Activity: Insufficiently Active (02/07/2024)   Exercise Vital Sign    Days of Exercise per Week: 3 days    Minutes of Exercise per Session: 40 min  Stress: No Stress Concern Present (02/07/2024)   [employer] of Occupational Health - Occupational Stress Questionnaire    Feeling of Stress : Not at all  Recent Concern: Stress - Stress Concern Present (11/19/2023)   [employer] of Occupational Health - Occupational Stress Questionnaire    Feeling of Stress : To some extent  Social Connections: Moderately Integrated (02/07/2024)   Social Connection and Isolation Panel [NHANES]    Frequency of Communication with Friends and Family: More than three times a week    Frequency of Social Gatherings with Friends and Family:  Twice a week    Attends Religious Services: More than 4 times per year    Active Member of Clubs or Organizations: No    Attends Banker Meetings: Never    Marital Status: Married  Catering manager Violence: Not At Risk (02/07/2024)   Humiliation, Afraid, Rape, and Kick questionnaire    Fear of Current or Ex-Partner: No    Emotionally Abused: No    Physically Abused: No    Sexually Abused: No    Outpatient Encounter Medications as of 02/17/2024  Medication Sig   albuterol (VENTOLIN HFA) 108 (90 Base) MCG/ACT inhaler Inhale 2 puffs into the lungs every 6 (six) hours as needed for wheezing or shortness of breath.   albuterol (VENTOLIN HFA) 108 (90 Base) MCG/ACT inhaler Inhale 2 puffs into the lungs every 6 (six) hours as needed for wheezing or shortness of breath.   amLODipine (NORVASC) 5 MG tablet TAKE 1 TABLET EVERY DAY   amoxicillin-clavulanate (AUGMENTIN) 875-125 MG tablet Take 1 tablet by mouth 2 (two) times daily for 7 days.   aspirin EC 81 MG tablet Take 81 mg by mouth daily. Swallow whole.   azithromycin (ZITHROMAX  Z-PAK) 250 MG tablet As directed   busPIRone (BUSPAR) 7.5 MG tablet TAKE 1 TABLET THREE TIMES DAILY   Calcium-Magnesium-Zinc (CAL-MAG-ZINC PO) Take 1 tablet by mouth daily.   cholecalciferol (VITAMIN D3) 25 MCG (1000 UNIT) tablet Take 2,000 Units by mouth daily.   dicyclomine (BENTYL) 20 MG tablet TAKE 1 TABLET THREE TIMES DAILY   EPINEPHrine 0.3 mg/0.3 mL IJ SOAJ injection Inject 0.3 mg into the muscle as needed for anaphylaxis.   ezetimibe (ZETIA) 10 MG tablet Take 1 tablet (10 mg total) by mouth daily.   fluconazole (DIFLUCAN) 150 MG tablet Take 1 tablet (150 mg total) by mouth once for 1 dose.   hydrochlorothiazide (HYDRODIURIL) 25 MG tablet TAKE 1 TABLET EVERY DAY   levocetirizine (XYZAL) 5 MG tablet Take 1 tablet (5 mg total) by mouth every evening.   methocarbamol (ROBAXIN) 500 MG tablet Take 1 tablet (500 mg total) by mouth every 8 (eight) hours as needed for muscle spasms.   metoprolol succinate (TOPROL-XL) 50 MG 24 hr tablet TAKE 1 TABLET DAILY WITH OR IMMEDIATELY FOLLOWING A MEAL   mupirocin ointment (BACTROBAN) 2 % APPLY 1 APPLICATION TOPICALLY 2 (TWO) TIMES DAILY.   ondansetron (ZOFRAN) 4 MG tablet Take 1 tablet (4 mg total) by mouth every 8 (eight) hours as needed for nausea or vomiting.   pantoprazole (PROTONIX) 40 MG tablet TAKE 1 TABLET DAILY 30 MINUTES BEFORE BREAKFAST   pravastatin (PRAVACHOL) 10 MG tablet Take 1 tablet (10 mg total) by mouth daily.   predniSONE (DELTASONE) 20 MG tablet Take 2 tablets (40 mg total) by mouth daily with breakfast for 5 days.   pyridOXINE (VITAMIN B6) 100 MG tablet Take 200 mg by mouth daily.   triamcinolone cream (KENALOG) 0.1 % APPLY TOPICALLY TWO TIMES DAILY AS NEEDED   [DISCONTINUED] traMADol (ULTRAM) 50 MG tablet Take 1 tablet (50 mg total) by mouth 2 (two) times daily.   [DISCONTINUED] traMADol (ULTRAM) 50 MG tablet Take 1 tablet (50 mg total) by mouth 2 (two) times daily.   [START ON 03/18/2024] traMADol (ULTRAM) 50 MG tablet Take 1 tablet  (50 mg total) by mouth 2 (two) times daily.   [START ON 04/17/2024] traMADol (ULTRAM) 50 MG tablet Take 1 tablet (50 mg total) by mouth 2 (two) times daily.   traMADol (ULTRAM) 50 MG tablet  Take 1 tablet (50 mg total) by mouth 2 (two) times daily.   [DISCONTINUED] benzonatate (TESSALON PERLES) 100 MG capsule Take 1 capsule (100 mg total) by mouth 3 (three) times daily as needed.   [DISCONTINUED] prednisoLONE acetate (PRED FORTE) 1 % ophthalmic suspension Place 1 drop into both eyes every 4 (four) hours.   [DISCONTINUED] promethazine-dextromethorphan (PROMETHAZINE-DM) 6.25-15 MG/5ML syrup Take 5 mLs by mouth 4 (four) times daily as needed for cough.   No facility-administered encounter medications on file as of 02/17/2024.    Allergies  Allergen Reactions   Bee Venom Anaphylaxis   Codeine Anaphylaxis   Statins Other (See Comments)    Muscle aches   Anusol-Hc [Hydrocortisone]     Rash/itching, all hemorrhoid creams    Pertinent ROS per HPI, otherwise unremarkable      Objective:  BP (!) 151/84   Pulse 61   Temp (!) 97.4 F (36.3 C)   Ht 5\' 3"  (1.6 m)   Wt 142 lb 3.2 oz (64.5 kg)   SpO2 99%   BMI 25.19 kg/m    Wt Readings from Last 3 Encounters:  02/17/24 142 lb 3.2 oz (64.5 kg)  02/07/24 149 lb (67.6 kg)  12/29/23 149 lb (67.6 kg)    Physical Exam Vitals and nursing note reviewed.  Constitutional:      General: She is not in acute distress.    Appearance: She is normal weight. She is not ill-appearing, toxic-appearing or diaphoretic.  HENT:     Head: Normocephalic and atraumatic.     Nose: Nose normal.     Mouth/Throat:     Mouth: Mucous membranes are moist.     Pharynx: Oropharynx is clear.  Eyes:     Conjunctiva/sclera: Conjunctivae normal.     Pupils: Pupils are equal, round, and reactive to light.  Cardiovascular:     Rate and Rhythm: Normal rate and regular rhythm.     Heart sounds: Normal heart sounds.  Pulmonary:     Effort: Pulmonary effort is normal.      Breath sounds: Wheezing and rhonchi (RLL) present.  Musculoskeletal:     Cervical back: Normal range of motion and neck supple.     Right lower leg: No edema.     Left lower leg: No edema.     Comments: Decreased ROM in back and hips due to pain  Skin:    General: Skin is warm and dry.     Capillary Refill: Capillary refill takes less than 2 seconds.  Neurological:     General: No focal deficit present.     Mental Status: She is alert and oriented to person, place, and time.  Psychiatric:        Mood and Affect: Mood normal.        Behavior: Behavior normal.        Thought Content: Thought content normal.        Judgment: Judgment normal.     Results for orders placed or performed in visit on 12/29/23  Rapid Strep Screen ([facility] ONLY)   Collection Time: 12/29/23 10:37 AM   Specimen: Other   Other  Result Value Ref Range   Strep Gp A Ag, IA W/Reflex Negative Negative  Culture, Group A Strep   Collection Time: 12/29/23 10:37 AM   Other  Result Value Ref Range   Strep A Culture CANCELED   Veritor Flu A/B Waived   Collection Time: 12/29/23 10:37 AM  Result Value Ref Range   Influenza  A Negative Negative   Influenza B Negative Negative  Culture, Group A Strep   Collection Time: 12/29/23 10:59 AM   Specimen: Throat   TH  Result Value Ref Range   Strep A Culture Negative   COVID-19, Flu A+B and RSV   Collection Time: 12/29/23 11:02 AM   Specimen: Nasopharyngeal(NP) swabs in vial transport medium  Result Value Ref Range   SARS-CoV-2, NAA Not Detected Not Detected   Influenza A, NAA Not Detected Not Detected   Influenza B, NAA Not Detected Not Detected   RSV, NAA Not Detected Not Detected   Test Information: Comment        Pertinent labs & imaging results that were available during my care of the patient were reviewed by me and considered in my medical decision making.  Assessment & Plan:  Rita was seen today for pain management, dysuria and vaginal  itching.  Diagnoses and all orders for this visit:  Chronic left shoulder pain -     traMADol (ULTRAM) 50 MG tablet; Take 1 tablet (50 mg total) by mouth 2 (two) times daily. -     traMADol (ULTRAM) 50 MG tablet; Take 1 tablet (50 mg total) by mouth 2 (two) times daily. -     ToxASSURE Select 13 (MW), Urine -     traMADol (ULTRAM) 50 MG tablet; Take 1 tablet (50 mg total) by mouth 2 (two) times daily.  Chronic right hip pain -     traMADol (ULTRAM) 50 MG tablet; Take 1 tablet (50 mg total) by mouth 2 (two) times daily. -     traMADol (ULTRAM) 50 MG tablet; Take 1 tablet (50 mg total) by mouth 2 (two) times daily. -     ToxASSURE Select 13 (MW), Urine -     traMADol (ULTRAM) 50 MG tablet; Take 1 tablet (50 mg total) by mouth 2 (two) times daily.  Chronic pain of right knee -     traMADol (ULTRAM) 50 MG tablet; Take 1 tablet (50 mg total) by mouth 2 (two) times daily. -     traMADol (ULTRAM) 50 MG tablet; Take 1 tablet (50 mg total) by mouth 2 (two) times daily. -     ToxASSURE Select 13 (MW), Urine -     traMADol (ULTRAM) 50 MG tablet; Take 1 tablet (50 mg total) by mouth 2 (two) times daily.  Dysuria -     WET PREP FOR TRICH, YEAST, CLUE -     Urine Culture -     Urinalysis, Routine w reflex microscopic  Vaginal irritation -     WET PREP FOR TRICH, YEAST, CLUE -     Urine Culture -     Urinalysis, Routine w reflex microscopic -     fluconazole (DIFLUCAN) 150 MG tablet; Take 1 tablet (150 mg total) by mouth once for 1 dose.  Controlled substance agreement signed -     traMADol (ULTRAM) 50 MG tablet; Take 1 tablet (50 mg total) by mouth 2 (two) times daily. -     traMADol (ULTRAM) 50 MG tablet; Take 1 tablet (50 mg total) by mouth 2 (two) times daily. -     ToxASSURE Select 13 (MW), Urine -     traMADol (ULTRAM) 50 MG tablet; Take 1 tablet (50 mg total) by mouth 2 (two) times daily.  Exercise-induced leg fatigue -     US ARTERIAL ABI (SCREENING LOWER EXTREMITY);  Future  Wheezing on both sides of chest -  amoxicillin-clavulanate (AUGMENTIN) 875-125 MG tablet; Take 1 tablet by mouth 2 (two) times daily for 7 days. -     azithromycin (ZITHROMAX Z-PAK) 250 MG tablet; As directed -     predniSONE (DELTASONE) 20 MG tablet; Take 2 tablets (40 mg total) by mouth daily with breakfast for 5 days. -     albuterol (VENTOLIN HFA) 108 (90 Base) MCG/ACT inhaler; Inhale 2 puffs into the lungs every 6 (six) hours as needed for wheezing or shortness of breath.  Community acquired pneumonia of right lower lobe of lung -     predniSONE (DELTASONE) 20 MG tablet; Take 2 tablets (40 mg total) by mouth daily with breakfast for 5 days. -     albuterol (VENTOLIN HFA) 108 (90 Base) MCG/ACT inhaler; Inhale 2 puffs into the lungs every 6 (six) hours as needed for wheezing or shortness of breath.     Assessment and Plan    Post-Viral Pneumonia Persistent wheezing and malaise, likely post-viral bacterial infection following recent influenza and respiratory infection, suggestive of post-viral pneumonia. - Prescribe two antibiotics for pneumonia - Prescribe prednisone burst - Prescribe albuterol inhaler  Urinary Tract Infection (UTI) Symptoms of dysuria, pruritus, and malodorous urine for one week, suggestive of a UTI. Antibiotics prescribed for pneumonia will also cover UTI. - Obtain urine sample for culture - Prescribe antibiotics that also cover UTI  Vaginal Yeast Infection Vaginal pruritus and irritation likely secondary to recent antibiotic use, suggestive of a yeast infection. - Prescribe Diflucan with one refill - Perform wet prep to rule out other causes  Claudication Burning and muscle pain in thighs and hips after walking, relieved by rest, suggestive of claudication due to arterial circulation issues. - Order ABI study to assess arterial circulation - Consider further imaging if ABI is abnormal  Medication Management Discussion of medication management  and pharmacy preferences, including tramadol prescription management. - [facility]      Total time spent with patient today was 40 minutes.      Continue all other maintenance medications.  Follow up plan: Return in about 3 months (around 05/19/2024) for pain management, chronic follow up.   Continue healthy lifestyle choices, including diet (rich in fruits, vegetables, and lean proteins, and low in salt and simple carbohydrates) and exercise (at least 30 minutes of moderate physical activity daily).  Educational handout given for CAP  The above assessment and management plan was discussed with the patient. The patient verbalized understanding of and has agreed to the management plan. Patient is aware to call the clinic if they develop any new symptoms or if symptoms persist or worsen. Patient is aware when to return to the clinic for a follow-up visit. Patient educated on when it is appropriate to go to the emergency department.   Kari Baars, FNP-C [facility] [phone]

## 2024-02-18 LAB — URINE CULTURE

## 2024-02-21 ENCOUNTER — Encounter: Payer: Self-pay | Admitting: Family Medicine

## 2024-02-21 LAB — TOXASSURE SELECT 13 (MW), URINE

## 2024-02-22 MED ORDER — NITROFURANTOIN MONOHYD MACRO 100 MG PO CAPS: 100.0000 mg | ORAL_CAPSULE | Freq: Two times a day (BID) | ORAL | 0 refills | Status: AC

## 2024-02-22 NOTE — Addendum Note (Signed)
 Addended by: Sonny Masters on: 02/22/2024 07:59 AM   Modules accepted: Orders

## 2024-02-24 ENCOUNTER — Encounter: Payer: Self-pay | Admitting: Family Medicine

## 2024-02-25 ENCOUNTER — Other Ambulatory Visit: Payer: Self-pay | Admitting: Family Medicine

## 2024-02-25 DIAGNOSIS — H60542 Acute eczematoid otitis externa, left ear: Secondary | ICD-10-CM

## 2024-03-02 ENCOUNTER — Ambulatory Visit (HOSPITAL_COMMUNITY)

## 2024-03-13 ENCOUNTER — Other Ambulatory Visit: Payer: Self-pay | Admitting: Family Medicine

## 2024-04-20 ENCOUNTER — Other Ambulatory Visit: Payer: Self-pay | Admitting: Family Medicine

## 2024-04-20 DIAGNOSIS — I10 Essential (primary) hypertension: Secondary | ICD-10-CM

## 2024-04-20 DIAGNOSIS — K219 Gastro-esophageal reflux disease without esophagitis: Secondary | ICD-10-CM

## 2024-04-20 DIAGNOSIS — K589 Irritable bowel syndrome without diarrhea: Secondary | ICD-10-CM

## 2024-04-28 ENCOUNTER — Other Ambulatory Visit: Payer: Self-pay | Admitting: Family Medicine

## 2024-05-15 ENCOUNTER — Ambulatory Visit: Admitting: Family Medicine

## 2024-05-22 ENCOUNTER — Ambulatory Visit: Admitting: Family Medicine

## 2024-06-15 ENCOUNTER — Other Ambulatory Visit: Payer: Self-pay | Admitting: Family Medicine

## 2024-06-15 DIAGNOSIS — I700 Atherosclerosis of aorta: Secondary | ICD-10-CM

## 2024-06-15 DIAGNOSIS — E782 Mixed hyperlipidemia: Secondary | ICD-10-CM

## 2024-06-25 ENCOUNTER — Other Ambulatory Visit: Payer: Self-pay | Admitting: Family Medicine

## 2024-06-25 DIAGNOSIS — Z91030 Bee allergy status: Secondary | ICD-10-CM

## 2024-06-25 NOTE — Telephone Encounter (Signed)
 Copied from CRM [id number]. Topic: Clinical - Medication Refill >> Jun 25, 2024  8:49 AM Graeme ORN wrote: Medication: EPINEPHrine  0.3 mg/0.3 mL IJ SOAJ injection - Ones she has are expired. May need financial assistance for help with copay   Has the patient contacted their pharmacy? Yes (Agent: If no, request that the patient contact the pharmacy for the refill. If patient does not wish to contact the pharmacy document the reason why and proceed with request.) (Agent: If yes, when and what did the pharmacy advise?) Expired needs   This is the patient's preferred pharmacy:  [id number] - [place], [place] - [address] [place] [address] Phone: [phone] Fax: [phone]  Is this the correct pharmacy for this prescription? Yes If no, delete pharmacy and type the correct one.   Has the prescription been filled recently? No  Is the patient out of the medication? Yes - expired  Has the patient been seen for an appointment in the last year OR does the patient have an upcoming appointment? Yes  Can we respond through MyChart? No  Agent: Please be advised that Rx refills may take up to 3 business days. We ask that you follow-up with your pharmacy.   ----------------------------------------------------------------------- From previous Reason for Contact - Scheduling: Patient/patient representative is calling to schedule an appointment. Refer to attachments for appointment information.

## 2024-06-27 ENCOUNTER — Ambulatory Visit (INDEPENDENT_AMBULATORY_CARE_PROVIDER_SITE_OTHER): Admitting: Family Medicine

## 2024-06-27 ENCOUNTER — Ambulatory Visit: Payer: Self-pay | Admitting: Family Medicine

## 2024-06-27 ENCOUNTER — Other Ambulatory Visit

## 2024-06-27 VITALS — BP 130/77 | HR 75 | Temp 97.2°F | Ht 63.0 in | Wt 141.0 lb

## 2024-06-27 DIAGNOSIS — K219 Gastro-esophageal reflux disease without esophagitis: Secondary | ICD-10-CM | POA: Diagnosis not present

## 2024-06-27 DIAGNOSIS — M25551 Pain in right hip: Secondary | ICD-10-CM

## 2024-06-27 DIAGNOSIS — R002 Palpitations: Secondary | ICD-10-CM

## 2024-06-27 DIAGNOSIS — M25512 Pain in left shoulder: Secondary | ICD-10-CM | POA: Diagnosis not present

## 2024-06-27 DIAGNOSIS — R0789 Other chest pain: Secondary | ICD-10-CM

## 2024-06-27 DIAGNOSIS — Z91030 Bee allergy status: Secondary | ICD-10-CM

## 2024-06-27 DIAGNOSIS — M25561 Pain in right knee: Secondary | ICD-10-CM

## 2024-06-27 DIAGNOSIS — G8929 Other chronic pain: Secondary | ICD-10-CM

## 2024-06-27 DIAGNOSIS — I10 Essential (primary) hypertension: Secondary | ICD-10-CM | POA: Diagnosis not present

## 2024-06-27 DIAGNOSIS — R300 Dysuria: Secondary | ICD-10-CM

## 2024-06-27 DIAGNOSIS — E782 Mixed hyperlipidemia: Secondary | ICD-10-CM

## 2024-06-27 DIAGNOSIS — I4891 Unspecified atrial fibrillation: Secondary | ICD-10-CM

## 2024-06-27 DIAGNOSIS — I700 Atherosclerosis of aorta: Secondary | ICD-10-CM

## 2024-06-27 DIAGNOSIS — R110 Nausea: Secondary | ICD-10-CM

## 2024-06-27 LAB — MICROSCOPIC EXAMINATION
Bacteria, UA: NONE SEEN
Renal Epithel, UA: NONE SEEN /HPF
WBC, UA: NONE SEEN /HPF (ref 0–5)
Yeast, UA: NONE SEEN

## 2024-06-27 LAB — URINALYSIS, ROUTINE W REFLEX MICROSCOPIC
Bilirubin, UA: NEGATIVE
Glucose, UA: NEGATIVE
Ketones, UA: NEGATIVE
Nitrite, UA: NEGATIVE
Protein,UA: NEGATIVE
Specific Gravity, UA: <1.005 — ABNORMAL LOW (ref 1.005–1.030)
Urobilinogen, Ur: 0.2 mg/dL (ref 0.2–1.0)
pH, UA: 6.5 (ref 5.0–7.5)

## 2024-06-27 MED ORDER — TRAMADOL HCL 50 MG PO TABS
50.0000 mg | ORAL_TABLET | Freq: Two times a day (BID) | ORAL | 0 refills | Status: DC
Start: 2024-06-27 — End: 2024-09-20

## 2024-06-27 MED ORDER — EPINEPHRINE 0.3 MG/0.3ML IJ SOAJ: 0.3000 mg | INTRAMUSCULAR | 3 refills | Status: DC | PRN

## 2024-06-27 MED ORDER — ONDANSETRON HCL 4 MG PO TABS: 4.0000 mg | ORAL_TABLET | Freq: Three times a day (TID) | ORAL | 1 refills | Status: DC | PRN

## 2024-06-27 MED ORDER — NITROFURANTOIN MONOHYD MACRO 100 MG PO CAPS
100.0000 mg | ORAL_CAPSULE | Freq: Two times a day (BID) | ORAL | 0 refills | Status: AC
Start: 2024-06-27 — End: 2024-07-04

## 2024-06-27 MED ORDER — TRAMADOL HCL 50 MG PO TABS: 50.0000 mg | ORAL_TABLET | Freq: Two times a day (BID) | ORAL | 0 refills | Status: DC

## 2024-06-27 MED ORDER — HYDROCHLOROTHIAZIDE 25 MG PO TABS: 25.0000 mg | ORAL_TABLET | Freq: Every day | ORAL | 1 refills | Status: DC

## 2024-06-27 MED ORDER — PRAVASTATIN SODIUM 10 MG PO TABS: 10.0000 mg | ORAL_TABLET | Freq: Every day | ORAL | 1 refills | Status: DC

## 2024-06-27 MED ORDER — APIXABAN 5 MG PO TABS
5.0000 mg | ORAL_TABLET | Freq: Two times a day (BID) | ORAL | 3 refills | Status: DC
Start: 2024-06-27 — End: 2024-07-18

## 2024-06-27 MED ORDER — PANTOPRAZOLE SODIUM 40 MG PO TBEC: 40.0000 mg | DELAYED_RELEASE_TABLET | Freq: Every day | ORAL | 1 refills | Status: DC

## 2024-06-27 MED ORDER — METOPROLOL SUCCINATE ER 50 MG PO TB24: 50.0000 mg | ORAL_TABLET | Freq: Every day | ORAL | 1 refills | Status: DC

## 2024-06-27 MED ORDER — DILTIAZEM HCL ER COATED BEADS 120 MG PO CP24: 120.0000 mg | ORAL_CAPSULE | Freq: Every day | ORAL | 3 refills | Status: DC

## 2024-06-27 MED ORDER — ONDANSETRON HCL 4 MG PO TABS: 4.0000 mg | ORAL_TABLET | Freq: Three times a day (TID) | ORAL | 1 refills | Status: AC | PRN

## 2024-06-27 NOTE — Progress Notes (Signed)
 Subjective:  Patient ID: Sherry Salinas, female    DOB: [birth year], 68 y.o.   MRN: [id number]  Patient Care Team: Severa Rock HERO, FNP as PCP - General (Family Medicine) Shaaron Lamar HERO, MD as Consulting Physician (Gastroenterology) Billee Mliss JONETTA, [facility] as [facility] Care Management (Pharmacist) [facility]   Chief Complaint:  Medical Management of Chronic Issues and Dysuria (X 4 days )   HPI: Sherry Salinas is a 68 y.o. female presenting on 06/27/2024 for Medical Management of Chronic Issues and Dysuria (X 4 days )   Sherry Salinas is a 68 year old female with diverticulitis who presents with a diverticulitis attack and chronic pain management.  She is experiencing a diverticulitis attack that began on Saturday, characterized by constipation, which is typical for her during these episodes. No blood in stool is noted. Her last colonoscopy was a few years ago, and she believes it is due every five years. She has not seen her GI doctor in about a year.  She is here for chronic pain management and to refill her tramadol  prescription. She takes tramadol  primarily at night to aid with sleep, usually one tablet a day, although it is prescribed for twice daily use if needed. Her chronic pain, particularly in her right hip, can reach a severity of six out of ten, especially when engaging in physical activities. Tramadol  helps reduce the pain significantly. Tramadol  can cause constipation, but she does not take anything daily for it.  She reports urinary symptoms including burning and a strong odor, which started on Sunday. She has been managing it by increasing her water  intake. She felt like she had a fever upon waking up this morning and generally feels unwell. She reports that her urinary symptoms tend to occur when her colon 'acts up,' and that they 'just go together.'  She experienced chest heaviness and palpitations on Monday night, described as  a 'jittery' feeling in the center of her chest that worsens when lying down. No associated arm pain, shortness of breath, or nausea, although she does have occasional nausea that she manages with Zofran , taken as needed. She takes metoprolol  in the morning, which she believes does not help with the palpitations.  She takes pravastatin  a couple of times a week at night. She also takes Buspar  twice a day for anxiety and depression, although it is prescribed for three times a day. 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     Pain assessment: Cause of pain- arthritis, shoulder, hip and knee Pain location- shoulder, hip, and knee Pain on scale of 1-10- 6/10 Frequency- daily What increases pain-ADLs, farm and house chores What makes pain Better-rest and medications  Effects on ADL - minimal to significant Any change in general medical condition-no  Current opioids rx- Tramadol  50 mg twice daily # meds rx- 60 Effectiveness of current meds-good Adverse reactions from pain meds-constipation Morphine equivalent- 10 MME/DAY  Pill count performed-No Last drug screen - 02/2024 ( high risk q72m, moderate risk q58m, low risk yearly ) Urine drug screen today- No Was the NCCSR reviewed- yes  If yes were their any concerning findings? - no   Overdose risk: low    01/13/2023    9:46 PM  Opioid Risk   Alcohol 0  Illegal Drugs 0  Rx  Drugs 0  Alcohol 0  Illegal Drugs 0  Rx Drugs 0  Age between 16-45 years  0  History of Preadolescent Sexual Abuse 0  Psychological Disease 2  ADD Negative  OCD Negative  Bipolar Negative  Depression 0  Opioid Risk Tool Scoring 2  Opioid Risk Interpretation Low Risk     Pain contract signed on:02/2024     Relevant past medical, surgical, family, and social history reviewed and updated as indicated.  Allergies  and medications reviewed and updated. Data reviewed: Chart in Epic.   Past Medical History:  Diagnosis Date   Anxiety    Aortic atherosclerosis (HCC)    Arthritis    Hyperlipidemia    Hypertension    IBS (irritable bowel syndrome)    Osteopenia 01/08/2021   Prediabetes 01/18/2022   Vitamin D  deficiency 09/14/2017    Past Surgical History:  Procedure Laterality Date   COLONOSCOPY  2015   Dr. Dyane: per pt, diverticulosis, 3 polyps, come back in five years.    COLONOSCOPY WITH ESOPHAGOGASTRODUODENOSCOPY (EGD)  02/2003   Dr. Dyane: Grade 3 esophagitis, hiatal hernia with patulous GE junction, mild duodenitis.  CLOtest, results unavailable.  Colonoscopy normal.   COLONOSCOPY WITH PROPOFOL  N/A 06/23/2021   Procedure: COLONOSCOPY WITH PROPOFOL ;  Surgeon: Cindie Carlin POUR, DO;  [facility]: [facility];  Service: Endoscopy;  Laterality: N/A;  10:30am   POLYPECTOMY  06/23/2021   Procedure: POLYPECTOMY INTESTINAL;  Surgeon: Cindie Carlin POUR, DO;  Location: [facility];  Service: Endoscopy;;   REPLACEMENT TOTAL KNEE Right 2012   ROTATOR CUFF REPAIR Right     Social History   Socioeconomic History   Marital status: Married    Spouse name: Not on file   Number of children: 2   Years of education: Not on file   Highest education level: Associate degree: occupational, Scientist, product/process development, or vocational program  Occupational History   Occupation: Retired    Comment: Previously EMT, FF, and RN  Tobacco Use   Smoking status: Every Day    Current packs/day: 1.00    Average packs/day: 1 pack/day for 30.0 years (30.0 ttl pk-yrs)    Types: Cigarettes   Smokeless tobacco: Never  Vaping Use   Vaping status: Never Used  Substance and Sexual Activity   Alcohol use: Never   Drug use: Never   Sexual activity: Not on file  Other Topics Concern   Not on file  Social History Narrative   Lives with husband, son and granddaughter    Social Drivers of Health   Financial Resource Strain: Medium Risk  (06/27/2024)   Overall Financial Resource Strain (CARDIA)    Difficulty of Paying Living Expenses: Somewhat hard  Food Insecurity: No Food Insecurity (06/27/2024)   Hunger Vital Sign    Worried About Running Out of Food in the Last Year: Never true    Ran Out of Food in the Last Year: Never true  Transportation Needs: No Transportation Needs (06/27/2024)   PRAPARE - Administrator, Civil Service (Medical): No    Lack of Transportation (Non-Medical): No  Physical Activity: Insufficiently Active (06/27/2024)   Exercise Vital Sign    Days of Exercise per Week: 5 days    Minutes of Exercise per Session: 20 min  Stress: Stress Concern Present (06/27/2024)   [employer] of Occupational Health - Occupational Stress Questionnaire    Feeling of Stress: Very much  Social Connections: Moderately Integrated (06/27/2024)   Social Connection and Isolation Panel  Frequency of Communication with Friends and Family: More than three times a week    Frequency of Social Gatherings with Friends and Family: More than three times a week    Attends Religious Services: More than 4 times per year    Active Member of [employer] or Organizations: No    Attends Banker Meetings: Not on file    Marital Status: Married  Catering manager Violence: Not At Risk (02/07/2024)   Humiliation, Afraid, Rape, and Kick questionnaire    Fear of Current or Ex-Partner: No    Emotionally Abused: No    Physically Abused: No    Sexually Abused: No    Outpatient Encounter Medications as of 06/27/2024  Medication Sig   albuterol  (VENTOLIN  HFA) 108 (90 Base) MCG/ACT inhaler Inhale 2 puffs into the lungs every 6 (six) hours as needed for wheezing or shortness of breath.   apixaban  (ELIQUIS ) 5 MG TABS tablet Take 1 tablet (5 mg total) by mouth 2 (two) times daily.   busPIRone  (BUSPAR ) 7.5 MG tablet TAKE 1 TABLET THREE TIMES DAILY   Calcium-Magnesium-Zinc (CAL-MAG-ZINC PO) Take 1 tablet by mouth daily.    cholecalciferol (VITAMIN D3) 25 MCG (1000 UNIT) tablet Take 2,000 Units by mouth daily.   dicyclomine  (BENTYL ) 20 MG tablet TAKE 1 TABLET THREE TIMES DAILY   diltiazem  (CARDIZEM  CD) 120 MG 24 hr capsule Take 1 capsule (120 mg total) by mouth daily.   ezetimibe  (ZETIA ) 10 MG tablet TAKE 1 TABLET BY MOUTH EVERY DAY   levocetirizine (XYZAL ) 5 MG tablet Take 1 tablet (5 mg total) by mouth every evening.   methocarbamol  (ROBAXIN ) 500 MG tablet Take 1 tablet (500 mg total) by mouth every 8 (eight) hours as needed for muscle spasms.   mupirocin  ointment (BACTROBAN ) 2 % APPLY 1 APPLICATION TOPICALLY 2 (TWO) TIMES DAILY.   nitrofurantoin , macrocrystal-monohydrate, (MACROBID ) 100 MG capsule Take 1 capsule (100 mg total) by mouth 2 (two) times daily for 7 days.   pyridOXINE (VITAMIN B6) 100 MG tablet Take 200 mg by mouth daily.   traMADol  (ULTRAM ) 50 MG tablet Take 1 tablet (50 mg total) by mouth 2 (two) times daily.   traMADol  (ULTRAM ) 50 MG tablet Take 1 tablet (50 mg total) by mouth 2 (two) times daily.   triamcinolone  cream (KENALOG ) 0.1 % APPLY TOPICALLY TWO TIMES DAILY AS NEEDED   [DISCONTINUED] amLODipine  (NORVASC ) 5 MG tablet TAKE 1 TABLET EVERY DAY   [DISCONTINUED] aspirin EC 81 MG tablet Take 81 mg by mouth daily. Swallow whole.   [DISCONTINUED] EPINEPHrine  0.3 mg/0.3 mL IJ SOAJ injection Inject 0.3 mg into the muscle as needed for anaphylaxis.   [DISCONTINUED] hydrochlorothiazide  (HYDRODIURIL ) 25 MG tablet TAKE 1 TABLET EVERY DAY   [DISCONTINUED] metoprolol  succinate (TOPROL -XL) 50 MG 24 hr tablet TAKE 1 TABLET DAILY WITH OR IMMEDIATELY FOLLOWING A MEAL   [DISCONTINUED] ondansetron  (ZOFRAN ) 4 MG tablet Take 1 tablet (4 mg total) by mouth every 8 (eight) hours as needed for nausea or vomiting.   [DISCONTINUED] pantoprazole  (PROTONIX ) 40 MG tablet TAKE 1 TABLET EVERY DAY 30 MINUTES BEFORE BREAKFAST   [DISCONTINUED] pravastatin  (PRAVACHOL ) 10 MG tablet Take 1 tablet (10 mg total) by mouth daily.    [DISCONTINUED] traMADol  (ULTRAM ) 50 MG tablet Take 1 tablet (50 mg total) by mouth 2 (two) times daily.   EPINEPHrine  0.3 mg/0.3 mL IJ SOAJ injection Inject 0.3 mg into the muscle as needed for anaphylaxis.   hydrochlorothiazide  (HYDRODIURIL ) 25 MG tablet Take 1 tablet (25 mg total)  by mouth daily.   metoprolol  succinate (TOPROL -XL) 50 MG 24 hr tablet Take 1 tablet (50 mg total) by mouth daily. Take with or immediately following a meal.   ondansetron  (ZOFRAN ) 4 MG tablet Take 1 tablet (4 mg total) by mouth every 8 (eight) hours as needed for nausea or vomiting.   pantoprazole  (PROTONIX ) 40 MG tablet Take 1 tablet (40 mg total) by mouth daily.   pravastatin  (PRAVACHOL ) 10 MG tablet Take 1 tablet (10 mg total) by mouth daily.   traMADol  (ULTRAM ) 50 MG tablet Take 1 tablet (50 mg total) by mouth 2 (two) times daily.   [START ON 08/26/2024] traMADol  (ULTRAM ) 50 MG tablet Take 1 tablet (50 mg total) by mouth 2 (two) times daily.   [START ON 07/27/2024] traMADol  (ULTRAM ) 50 MG tablet Take 1 tablet (50 mg total) by mouth 2 (two) times daily.   [DISCONTINUED] albuterol  (VENTOLIN  HFA) 108 (90 Base) MCG/ACT inhaler Inhale 2 puffs into the lungs every 6 (six) hours as needed for wheezing or shortness of breath.   [DISCONTINUED] azithromycin  (ZITHROMAX  Z-PAK) 250 MG tablet As directed   [DISCONTINUED] EPINEPHrine  0.3 mg/0.3 mL IJ SOAJ injection Inject 0.3 mg into the muscle as needed for anaphylaxis.   [DISCONTINUED] hydrochlorothiazide  (HYDRODIURIL ) 25 MG tablet Take 1 tablet (25 mg total) by mouth daily.   [DISCONTINUED] metoprolol  succinate (TOPROL -XL) 50 MG 24 hr tablet Take 1 tablet (50 mg total) by mouth daily. Take with or immediately following a meal.   [DISCONTINUED] ondansetron  (ZOFRAN ) 4 MG tablet Take 1 tablet (4 mg total) by mouth every 8 (eight) hours as needed for nausea or vomiting.   [DISCONTINUED] pantoprazole  (PROTONIX ) 40 MG tablet Take 1 tablet (40 mg total) by mouth daily.   [DISCONTINUED]  pravastatin  (PRAVACHOL ) 10 MG tablet Take 1 tablet (10 mg total) by mouth daily.   No facility-administered encounter medications on file as of 06/27/2024.    Allergies  Allergen Reactions   Bee Venom Anaphylaxis   Codeine Anaphylaxis   Statins Other (See Comments)    Muscle aches   Anusol-Hc [Hydrocortisone]     Rash/itching, all hemorrhoid creams    Pertinent ROS per HPI, otherwise unremarkable      Objective:  BP 130/77   Pulse 75   Temp (!) 97.2 F (36.2 C)   Ht 5' 3 (1.6 m)   Wt 141 lb (64 kg)   SpO2 96%   BMI 24.98 kg/m    Wt Readings from Last 3 Encounters:  06/27/24 141 lb (64 kg)  02/17/24 142 lb 3.2 oz (64.5 kg)  02/07/24 149 lb (67.6 kg)    Physical Exam Vitals and nursing note reviewed.  Constitutional:      General: She is not in acute distress.    Appearance: Normal appearance. She is well-developed and well-groomed. She is not ill-appearing, toxic-appearing or diaphoretic.  HENT:     Head: Normocephalic and atraumatic.     Jaw: There is normal jaw occlusion.     Right Ear: Hearing normal.     Left Ear: Hearing normal.     Nose: Nose normal.     Mouth/Throat:     Lips: Pink.     Mouth: Mucous membranes are moist.     Pharynx: Oropharynx is clear. Uvula midline.  Eyes:     General: Lids are normal.     Extraocular Movements: Extraocular movements intact.     Conjunctiva/sclera: Conjunctivae normal.     Pupils: Pupils are equal, round, and reactive to light.  Neck:     Thyroid : No thyroid  mass, thyromegaly or thyroid  tenderness.     Vascular: No carotid bruit or JVD.     Trachea: Trachea and phonation normal.  Cardiovascular:     Rate and Rhythm: Normal rate. Rhythm irregularly irregular.     Chest Wall: PMI is not displaced.     Pulses: Normal pulses.     Heart sounds: Normal heart sounds. No murmur heard.    No friction rub. No gallop.  Pulmonary:     Effort: Pulmonary effort is normal. No respiratory distress.     Breath sounds:  Normal breath sounds. No wheezing.  Abdominal:     General: Bowel sounds are normal. There is no distension or abdominal bruit.     Palpations: Abdomen is soft. There is no hepatomegaly or splenomegaly.     Tenderness: There is no abdominal tenderness. There is no right CVA tenderness or left CVA tenderness.     Hernia: No hernia is present.  Musculoskeletal:     Cervical back: Normal range of motion and neck supple.     Right lower leg: No edema.     Left lower leg: No edema.     Comments: Decreased ROM in hips and lower back due to discomfort  Lymphadenopathy:     Cervical: No cervical adenopathy.  Skin:    General: Skin is warm and dry.     Capillary Refill: Capillary refill takes less than 2 seconds.     Coloration: Skin is not cyanotic, jaundiced or pale.     Findings: No rash.  Neurological:     General: No focal deficit present.     Mental Status: She is alert and oriented to person, place, and time.     Sensory: Sensation is intact.     Motor: Motor function is intact.     Coordination: Coordination is intact.     Gait: Gait is intact.     Deep Tendon Reflexes: Reflexes are normal and symmetric.  Psychiatric:        Attention and Perception: Attention and perception normal.        Mood and Affect: Mood and affect normal.        Speech: Speech normal.        Behavior: Behavior normal. Behavior is cooperative.        Thought Content: Thought content normal.        Cognition and Memory: Cognition and memory normal.        Judgment: Judgment normal.      Results for orders placed or performed in visit on 02/17/24  WET PREP FOR TRICH, YEAST, CLUE   Collection Time: 02/17/24 10:44 AM   Specimen: Vaginal Swab   Vaginal Swab  Result Value Ref Range   Trichomonas Exam Negative Negative   Yeast Exam Negative Negative   Clue Cell Exam Negative Negative  Urine Culture   Collection Time: 02/17/24 10:44 AM   Specimen: Urine   UR  Result Value Ref Range   Urine Culture,  Routine Final report    Organism ID, Bacteria Comment   Microscopic Examination   Collection Time: 02/17/24 10:44 AM   Urine  Result Value Ref Range   WBC, UA 0-5 0 - 5 /hpf   RBC, Urine None seen 0 - 2 /hpf   Epithelial Cells (non renal) 0-10 0 - 10 /hpf   Renal Epithel, UA None seen None seen /hpf   Bacteria, UA Few (A) None seen/Few  Yeast, UA None seen None seen  ToxASSURE Select 13 (MW), Urine   Collection Time: 02/17/24 10:44 AM  Result Value Ref Range   Summary FINAL   Urinalysis, Routine w reflex microscopic   Collection Time: 02/17/24 10:44 AM  Result Value Ref Range   Specific Gravity, UA <1.005 (L) 1.005 - 1.030   pH, UA 6.0 5.0 - 7.5   Color, UA Yellow Yellow   Appearance Ur Clear Clear   Leukocytes,UA 1+ (A) Negative   Protein,UA Negative Negative/Trace   Glucose, UA Negative Negative   Ketones, UA Negative Negative   RBC, UA Negative Negative   Bilirubin, UA Negative Negative   Urobilinogen, Ur 0.2 0.2 - 1.0 mg/dL   Nitrite, UA Negative Negative   Microscopic Examination See below:      EKG: A-Fib 110, QT 326, no acute ST-T changes. No ectopy. A-Fib not noted on prior EKG from 2012. Rosaline Bruns, FNP-C  Pertinent labs & imaging results that were available during my care of the patient were reviewed by me and considered in my medical decision making.  Assessment & Plan:  Sherry Salinas was seen today for medical management of chronic issues and dysuria.  Diagnoses and all orders for this visit:  Aortic atherosclerosis (HCC) -     Discontinue: pravastatin  (PRAVACHOL ) 10 MG tablet; Take 1 tablet (10 mg total) by mouth daily. -     Lipid panel -     CBC with Differential/Platelet -     CMP14+EGFR -     pravastatin  (PRAVACHOL ) 10 MG tablet; Take 1 tablet (10 mg total) by mouth daily.  Mixed hyperlipidemia -     Discontinue: pravastatin  (PRAVACHOL ) 10 MG tablet; Take 1 tablet (10 mg total) by mouth daily. -     Lipid panel -     CMP14+EGFR -     pravastatin   (PRAVACHOL ) 10 MG tablet; Take 1 tablet (10 mg total) by mouth daily.  Primary hypertension -     Discontinue: metoprolol  succinate (TOPROL -XL) 50 MG 24 hr tablet; Take 1 tablet (50 mg total) by mouth daily. Take with or immediately following a meal. -     Discontinue: hydrochlorothiazide  (HYDRODIURIL ) 25 MG tablet; Take 1 tablet (25 mg total) by mouth daily. -     Lipid panel -     CBC with Differential/Platelet -     CMP14+EGFR -     hydrochlorothiazide  (HYDRODIURIL ) 25 MG tablet; Take 1 tablet (25 mg total) by mouth daily. -     metoprolol  succinate (TOPROL -XL) 50 MG 24 hr tablet; Take 1 tablet (50 mg total) by mouth daily. Take with or immediately following a meal.  Gastroesophageal reflux disease, unspecified whether esophagitis present -     Discontinue: pantoprazole  (PROTONIX ) 40 MG tablet; Take 1 tablet (40 mg total) by mouth daily. -     CBC with Differential/Platelet -     pantoprazole  (PROTONIX ) 40 MG tablet; Take 1 tablet (40 mg total) by mouth daily.  Chronic left shoulder pain -     traMADol  (ULTRAM ) 50 MG tablet; Take 1 tablet (50 mg total) by mouth 2 (two) times daily. -     traMADol  (ULTRAM ) 50 MG tablet; Take 1 tablet (50 mg total) by mouth 2 (two) times daily. -     traMADol  (ULTRAM ) 50 MG tablet; Take 1 tablet (50 mg total) by mouth 2 (two) times daily.  Chronic right hip pain -     traMADol  (ULTRAM ) 50 MG tablet; Take 1 tablet (50 mg total)  by mouth 2 (two) times daily. -     traMADol  (ULTRAM ) 50 MG tablet; Take 1 tablet (50 mg total) by mouth 2 (two) times daily. -     traMADol  (ULTRAM ) 50 MG tablet; Take 1 tablet (50 mg total) by mouth 2 (two) times daily.  Chronic pain of right knee -     traMADol  (ULTRAM ) 50 MG tablet; Take 1 tablet (50 mg total) by mouth 2 (two) times daily. -     traMADol  (ULTRAM ) 50 MG tablet; Take 1 tablet (50 mg total) by mouth 2 (two) times daily. -     traMADol  (ULTRAM ) 50 MG tablet; Take 1 tablet (50 mg total) by mouth 2 (two) times  daily.  Bee sting allergy -     Discontinue: EPINEPHrine  0.3 mg/0.3 mL IJ SOAJ injection; Inject 0.3 mg into the muscle as needed for anaphylaxis. -     EPINEPHrine  0.3 mg/0.3 mL IJ SOAJ injection; Inject 0.3 mg into the muscle as needed for anaphylaxis.  Nausea in adult -     Discontinue: ondansetron  (ZOFRAN ) 4 MG tablet; Take 1 tablet (4 mg total) by mouth every 8 (eight) hours as needed for nausea or vomiting. -     ondansetron  (ZOFRAN ) 4 MG tablet; Take 1 tablet (4 mg total) by mouth every 8 (eight) hours as needed for nausea or vomiting.  Dysuria -     CMP14+EGFR -     Urinalysis, Routine w reflex microscopic -     Urine Culture -     nitrofurantoin , macrocrystal-monohydrate, (MACROBID ) 100 MG capsule; Take 1 capsule (100 mg total) by mouth 2 (two) times daily for 7 days.  Chest heaviness -     Lipid panel -     CBC with Differential/Platelet -     CMP14+EGFR -     Thyroid  Panel With TSH -     EKG 12-Lead -     LONG TERM MONITOR (3-14 DAYS); Future  Palpitation -     Lipid panel -     CBC with Differential/Platelet -     CMP14+EGFR -     Thyroid  Panel With TSH -     EKG 12-Lead -     LONG TERM MONITOR (3-14 DAYS); Future -     EKG 12-Lead  New onset a-fib (HCC) -     diltiazem  (CARDIZEM  CD) 120 MG 24 hr capsule; Take 1 capsule (120 mg total) by mouth daily. -     apixaban  (ELIQUIS ) 5 MG TABS tablet; Take 1 tablet (5 mg total) by mouth 2 (two) times daily. -     Ambulatory referral to Cardiology     Diverticulitis Acute diverticulitis flare-up since Saturday with constipation and multiple bowel movements per day, without hematochezia. Managed with mental coping strategies. No recent GI follow-up. Colonoscopy not due for a couple more years.  Urinary Tract Infection (UTI) Symptoms of dysuria and strong urine odor since Sunday. No preventive treatment for UTIs as she occurs with diverticulitis flares. Macrobid  typically effective for her UTIs. - Order urinalysis and  culture - Prescribe Macrobid  for UTI treatment - Adjust antibiotic based on culture results if necessary  Chest Pain and Palpitations Intermittent chest pain and palpitations, described as heaviness and fluttering, occurring when supine. Episode occurred Monday night. No associated arm pain, dyspnea, or nausea. Possible arrhythmia considered. EKG and Zio heart monitor planned to assess cardiac rhythm. - Perform EKG - Apply Zio heart monitor for two weeks - Order lab work including thyroid  function  and electrolytes  Chronic Pain Chronic pain primarily in the right hip, rated at least 6/10 during activity. Managed with tramadol , taken mostly at night for sleep. Tramadol  can cause constipation. - Continue tramadol  as needed  Anxiety and Depression Anxiety and depression managed with Buspar , currently taken twice daily instead of prescribed three times daily. Increased stress due to husband's health issues. Buspar  helps with anxiety and depression. - Increase Buspar  to three times daily as prescribed  Hypertension Managed with metoprolol , taken in the morning. - Continue metoprolol  as prescribed  Hyperlipidemia Managed with pravastatin , taken a couple of times a week at night. - Continue pravastatin  as prescribed  New onset atrial fibrillation New onset A-fib on EKG in office CHA2DS2-VASc score below. Discussed anticoagulation in detail and will start today. Will change amlodipine  to Cardizem  for better rate control. Urgent referral placed for cardiology.  Patient has a ZIO in place to monitor for A-fib burden.  Patient aware of red flags which require emergent evaluation and treatment. CHA?DS?-VASc Score for Atrial Fibrillation Stroke Risk from [email or web address]  on 06/27/2024 RESULT SUMMARY: 4 points Stroke risk was 4.8% per year in >90,000 patients (the [place] Atrial Fibrillation Cohort Study) and 6.7% risk of stroke/TIA/systemic embolism.  General Health Maintenance Blood pressure not  recently checked at home due to lack of cuff. - Consider obtaining a blood pressure cuff for home monitoring  Follow-up Follow-up for pain medication management and monitoring of current conditions. - Schedule follow-up in three months for pain medication management - Adjust follow-up timing if medication supply is adequate       Continue all other maintenance medications.  Follow up plan: Return in about 1 week (around 07/04/2024), or if symptoms worsen or fail to improve, for A-Fib.   Continue healthy lifestyle choices, including diet (rich in fruits, vegetables, and lean proteins, and low in salt and simple carbohydrates) and exercise (at least 30 minutes of moderate physical activity daily).  Educational handout given for A-fib, eliquis   The above assessment and management plan was discussed with the patient. The patient verbalized understanding of and has agreed to the management plan. Patient is aware to call the clinic if they develop any new symptoms or if symptoms persist or worsen. Patient is aware when to return to the clinic for a follow-up visit. Patient educated on when it is appropriate to go to the emergency department.   Rosaline Bruns, FNP-C [facility] [phone]

## 2024-06-28 LAB — CMP14+EGFR
ALT: 19 IU/L (ref 0–32)
AST: 17 IU/L (ref 0–40)
Albumin: 4.3 g/dL (ref 3.9–4.9)
Alkaline Phosphatase: 85 IU/L (ref 44–121)
BUN/Creatinine Ratio: 12 (ref 12–28)
BUN: 11 mg/dL (ref 8–27)
Bilirubin Total: 0.3 mg/dL (ref 0.0–1.2)
CO2: 20 mmol/L (ref 20–29)
Calcium: 9.9 mg/dL (ref 8.7–10.3)
Chloride: 106 mmol/L (ref 96–106)
Creatinine, Ser: 0.93 mg/dL (ref 0.57–1.00)
Globulin, Total: 2.4 g/dL (ref 1.5–4.5)
Glucose: 94 mg/dL (ref 70–99)
Potassium: 4.7 mmol/L (ref 3.5–5.2)
Sodium: 141 mmol/L (ref 134–144)
Total Protein: 6.7 g/dL (ref 6.0–8.5)
eGFR: 67 mL/min/1.73 (ref >59)

## 2024-06-28 LAB — CBC WITH DIFFERENTIAL/PLATELET
Basophils Absolute: 0 x10E3/uL (ref 0.0–0.2)
Basos: 0 %
EOS (ABSOLUTE): 0.1 x10E3/uL (ref 0.0–0.4)
Eos: 1 %
Hematocrit: 44.4 % (ref 34.0–46.6)
Hemoglobin: 14.5 g/dL (ref 11.1–15.9)
Immature Grans (Abs): 0 x10E3/uL (ref 0.0–0.1)
Immature Granulocytes: 0 %
Lymphocytes Absolute: 3.3 x10E3/uL — ABNORMAL HIGH (ref 0.7–3.1)
Lymphs: 33 %
MCH: 33 pg (ref 26.6–33.0)
MCHC: 32.7 g/dL (ref 31.5–35.7)
MCV: 101 fL — ABNORMAL HIGH (ref 79–97)
Monocytes Absolute: 1.1 x10E3/uL — ABNORMAL HIGH (ref 0.1–0.9)
Monocytes: 10 %
Neutrophils Absolute: 5.6 x10E3/uL (ref 1.4–7.0)
Neutrophils: 56 %
Platelets: 251 x10E3/uL (ref 150–450)
RBC: 4.39 x10E6/uL (ref 3.77–5.28)
RDW: 11.7 % (ref 11.7–15.4)
WBC: 10.1 x10E3/uL (ref 3.4–10.8)

## 2024-06-28 LAB — LIPID PANEL
Chol/HDL Ratio: 5.1 ratio — ABNORMAL HIGH (ref 0.0–4.4)
Cholesterol, Total: 214 mg/dL — ABNORMAL HIGH (ref 100–199)
HDL: 42 mg/dL (ref >39)
LDL Chol Calc (NIH): 141 mg/dL — ABNORMAL HIGH (ref 0–99)
Triglycerides: 172 mg/dL — ABNORMAL HIGH (ref 0–149)
VLDL Cholesterol Cal: 31 mg/dL (ref 5–40)

## 2024-06-28 LAB — THYROID PANEL WITH TSH
Free Thyroxine Index: 1.8 (ref 1.2–4.9)
T3 Uptake Ratio: 26 % (ref 24–39)
T4, Total: 6.8 ug/dL (ref 4.5–12.0)
TSH: 1.75 u[IU]/mL (ref 0.450–4.500)

## 2024-06-29 LAB — URINE CULTURE

## 2024-07-03 ENCOUNTER — Encounter: Payer: Self-pay | Admitting: Family Medicine

## 2024-07-03 ENCOUNTER — Ambulatory Visit (INDEPENDENT_AMBULATORY_CARE_PROVIDER_SITE_OTHER): Admitting: Family Medicine

## 2024-07-03 ENCOUNTER — Ambulatory Visit: Payer: Self-pay | Admitting: Family Medicine

## 2024-07-03 VITALS — BP 130/82 | HR 60 | Temp 95.3°F | Ht 63.0 in | Wt 139.0 lb

## 2024-07-03 DIAGNOSIS — I4819 Other persistent atrial fibrillation: Secondary | ICD-10-CM | POA: Insufficient documentation

## 2024-07-03 DIAGNOSIS — I700 Atherosclerosis of aorta: Secondary | ICD-10-CM | POA: Diagnosis not present

## 2024-07-03 DIAGNOSIS — I10 Essential (primary) hypertension: Secondary | ICD-10-CM | POA: Diagnosis not present

## 2024-07-03 DIAGNOSIS — I4891 Unspecified atrial fibrillation: Secondary | ICD-10-CM | POA: Diagnosis not present

## 2024-07-03 DIAGNOSIS — E782 Mixed hyperlipidemia: Secondary | ICD-10-CM | POA: Diagnosis not present

## 2024-07-03 MED ORDER — EZETIMIBE 10 MG PO TABS: 10.0000 mg | ORAL_TABLET | Freq: Every day | ORAL | 1 refills | Status: DC

## 2024-07-03 MED ORDER — METOPROLOL SUCCINATE ER 25 MG PO TB24: 25.0000 mg | ORAL_TABLET | Freq: Every day | ORAL | 3 refills | Status: DC

## 2024-07-03 NOTE — Addendum Note (Signed)
 Addended by: SEVERA ROCK HERO on: 07/03/2024 01:29 PM   Modules accepted: Orders

## 2024-07-03 NOTE — Progress Notes (Signed)
 Subjective:  Patient ID: Sherry Salinas, female    DOB: May 20, 1956, 68 y.o.   MRN: [id number]  Patient Care Team: Severa Rock HERO, FNP as PCP - General (Family Medicine) Shaaron Lamar HERO, MD as Consulting Physician (Gastroenterology) Billee Mliss JONETTA, [facility] as [facility] Care Management (Pharmacist) [facility]   Chief Complaint:  Atrial Fibrillation (1 week follow up )   HPI: Sherry Salinas is a 68 y.o. female presenting on 07/03/2024 for Atrial Fibrillation (1 week follow up )   Sherry Salinas is a 68 year old female with atrial fibrillation who presents with concerns about medication management and recent palpitations.  She has not started taking Eliquis  due to concerns about the cost and is inquiring about potential coupons or assistance.  However, her blood pressure readings were high yesterday, with a reading of 168/94 and a pulse of 87, which improved to 135/71 with a half dose of metoprolol . Today, she has only taken metoprolol  and not Cardizem , as she is concerned about potential interactions with Buspar , which she takes in the morning. She experiences panic attacks, increased blood pressure, chest pain, and sweating when unable to take Buspar .  She reports experiencing palpitations, chest pressure, and headaches yesterday from around 3 PM to 6 PM after a dental appointment. Her blood pressure was elevated during this time. She has been taking Cardizem  daily and notes that it helps with chest discomfort.  She is currently taking metoprolol , Buspar , and medication for acid reflux. She has run out of her cholesterol medication and plans to visit [facility] to refill it. She is using a blood pressure cuff received in the mail to monitor her readings.  She is wearing a patch to monitor her atrial fibrillation, although it is not adhering well. No current palpitations or racing heart sensations today, but experienced them yesterday. She denies  taking Cardizem  today due to concerns about drug interactions.          Relevant past medical, surgical, family, and social history reviewed and updated as indicated.  Allergies and medications reviewed and updated. Data reviewed: Chart in Epic.   Past Medical History:  Diagnosis Date   Anxiety    Aortic atherosclerosis (HCC)    Arthritis    Hyperlipidemia    Hypertension    IBS (irritable bowel syndrome)    Osteopenia 01/08/2021   Prediabetes 01/18/2022   Vitamin D  deficiency 09/14/2017    Past Surgical History:  Procedure Laterality Date   COLONOSCOPY  2015   Dr. Dyane: per pt, diverticulosis, 3 polyps, come back in five years.    COLONOSCOPY WITH ESOPHAGOGASTRODUODENOSCOPY (EGD)  02/2003   Dr. Dyane: Grade 3 esophagitis, hiatal hernia with patulous GE junction, mild duodenitis.  CLOtest, results unavailable.  Colonoscopy normal.   COLONOSCOPY WITH PROPOFOL  N/A 06/23/2021   Procedure: COLONOSCOPY WITH PROPOFOL ;  Surgeon: Cindie Carlin POUR, DO;  Location: [facility];  Service: Endoscopy;  Laterality: N/A;  10:30am   POLYPECTOMY  06/23/2021   Procedure: POLYPECTOMY INTESTINAL;  Surgeon: Cindie Carlin POUR, DO;  Location: [facility];  Service: Endoscopy;;   REPLACEMENT TOTAL KNEE Right 2012   ROTATOR CUFF REPAIR Right     Social History   Socioeconomic History   Marital status: Married    Spouse name: Not on file   Number of children: 2   Years of education: Not on file   Highest education level: Associate degree: occupational, Scientist, product/process development, or vocational program  Occupational History   Occupation: Retired    Comment: Previously EMT, FF, and RN  Tobacco Use   Smoking status: Every Day    Current packs/day: 1.00    Average packs/day: 1 pack/day for 30.0 years (30.0 ttl pk-yrs)    Types: Cigarettes   Smokeless tobacco: Never  Vaping Use   Vaping status: Never Used  Substance and Sexual Activity   Alcohol use: Never   Drug use: Never   Sexual activity: Not on  file  Other Topics Concern   Not on file  Social History Narrative   Lives with husband, son and granddaughter    Social Drivers of Health   Financial Resource Strain: Medium Risk (06/27/2024)   Overall Financial Resource Strain (CARDIA)    Difficulty of Paying Living Expenses: Somewhat hard  Food Insecurity: No Food Insecurity (06/27/2024)   Hunger Vital Sign    Worried About Running Out of Food in the Last Year: Never true    Ran Out of Food in the Last Year: Never true  Transportation Needs: No Transportation Needs (06/27/2024)   PRAPARE - Administrator, Civil Service (Medical): No    Lack of Transportation (Non-Medical): No  Physical Activity: Insufficiently Active (06/27/2024)   Exercise Vital Sign    Days of Exercise per Week: 5 days    Minutes of Exercise per Session: 20 min  Stress: Stress Concern Present (06/27/2024)   [employer] of Occupational Health - Occupational Stress Questionnaire    Feeling of Stress: Very much  Social Connections: Moderately Integrated (06/27/2024)   Social Connection and Isolation Panel    Frequency of Communication with Friends and Family: More than three times a week    Frequency of Social Gatherings with Friends and Family: More than three times a week    Attends Religious Services: More than 4 times per year    Active Member of [employer] or Organizations: No    Attends Banker Meetings: Not on file    Marital Status: Married  Catering manager Violence: Not At Risk (02/07/2024)   Humiliation, Afraid, Rape, and Kick questionnaire    Fear of Current or Ex-Partner: No    Emotionally Abused: No    Physically Abused: No    Sexually Abused: No    Outpatient Encounter Medications as of 07/03/2024  Medication Sig   albuterol  (VENTOLIN  HFA) 108 (90 Base) MCG/ACT inhaler Inhale 2 puffs into the lungs every 6 (six) hours as needed for wheezing or shortness of breath.   busPIRone  (BUSPAR ) 7.5 MG tablet TAKE 1 TABLET THREE  TIMES DAILY   Calcium-Magnesium-Zinc (CAL-MAG-ZINC PO) Take 1 tablet by mouth daily.   cholecalciferol (VITAMIN D3) 25 MCG (1000 UNIT) tablet Take 2,000 Units by mouth daily.   dicyclomine  (BENTYL ) 20 MG tablet TAKE 1 TABLET THREE TIMES DAILY   diltiazem  (CARDIZEM  CD) 120 MG 24 hr capsule Take 1 capsule (120 mg total) by mouth daily.   hydrochlorothiazide  (HYDRODIURIL ) 25 MG tablet Take 1 tablet (25 mg total) by mouth daily.   levocetirizine (XYZAL ) 5 MG tablet Take 1 tablet (5 mg total) by mouth every evening.   methocarbamol  (ROBAXIN ) 500 MG tablet Take 1 tablet (500 mg total) by mouth every 8 (eight) hours as needed for muscle spasms.   metoprolol  succinate (TOPROL -XL) 25 MG 24 hr tablet Take 1 tablet (25 mg total) by mouth daily.   mupirocin  ointment (BACTROBAN ) 2 % APPLY 1 APPLICATION TOPICALLY 2 (TWO) TIMES DAILY.   nitrofurantoin , macrocrystal-monohydrate, (MACROBID ) 100  MG capsule Take 1 capsule (100 mg total) by mouth 2 (two) times daily for 7 days.   ondansetron  (ZOFRAN ) 4 MG tablet Take 1 tablet (4 mg total) by mouth every 8 (eight) hours as needed for nausea or vomiting.   pantoprazole  (PROTONIX ) 40 MG tablet Take 1 tablet (40 mg total) by mouth daily.   pravastatin  (PRAVACHOL ) 10 MG tablet Take 1 tablet (10 mg total) by mouth daily.   pyridOXINE (VITAMIN B6) 100 MG tablet Take 200 mg by mouth daily.   traMADol  (ULTRAM ) 50 MG tablet Take 1 tablet (50 mg total) by mouth 2 (two) times daily.   traMADol  (ULTRAM ) 50 MG tablet Take 1 tablet (50 mg total) by mouth 2 (two) times daily.   traMADol  (ULTRAM ) 50 MG tablet Take 1 tablet (50 mg total) by mouth 2 (two) times daily.   [START ON 08/26/2024] traMADol  (ULTRAM ) 50 MG tablet Take 1 tablet (50 mg total) by mouth 2 (two) times daily.   [START ON 07/27/2024] traMADol  (ULTRAM ) 50 MG tablet Take 1 tablet (50 mg total) by mouth 2 (two) times daily.   triamcinolone  cream (KENALOG ) 0.1 % APPLY TOPICALLY TWO TIMES DAILY AS NEEDED   [DISCONTINUED]  ezetimibe  (ZETIA ) 10 MG tablet TAKE 1 TABLET BY MOUTH EVERY DAY   [DISCONTINUED] metoprolol  succinate (TOPROL -XL) 50 MG 24 hr tablet Take 1 tablet (50 mg total) by mouth daily. Take with or immediately following a meal.   apixaban  (ELIQUIS ) 5 MG TABS tablet Take 1 tablet (5 mg total) by mouth 2 (two) times daily. (Patient not taking: Reported on 07/03/2024)   EPINEPHrine  0.3 mg/0.3 mL IJ SOAJ injection Inject 0.3 mg into the muscle as needed for anaphylaxis. (Patient not taking: Reported on 07/03/2024)   ezetimibe  (ZETIA ) 10 MG tablet Take 1 tablet (10 mg total) by mouth daily.   No facility-administered encounter medications on file as of 07/03/2024.    Allergies  Allergen Reactions   Bee Venom Anaphylaxis   Codeine Anaphylaxis   Statins Other (See Comments)    Muscle aches   Anusol-Hc [Hydrocortisone]     Rash/itching, all hemorrhoid creams    Pertinent ROS per HPI, otherwise unremarkable      Objective:  BP 130/82   Pulse 60   Temp (!) 95.3 F (35.2 C)   Ht 5' 3 (1.6 m)   Wt 139 lb (63 kg)   SpO2 97%   BMI 24.62 kg/m    Wt Readings from Last 3 Encounters:  07/03/24 139 lb (63 kg)  06/27/24 141 lb (64 kg)  02/17/24 142 lb 3.2 oz (64.5 kg)    Physical Exam Vitals and nursing note reviewed.  Constitutional:      Appearance: Normal appearance.  HENT:     Head: Normocephalic and atraumatic.     Nose: Nose normal.     Mouth/Throat:     Mouth: Mucous membranes are moist.  Eyes:     Pupils: Pupils are equal, round, and reactive to light.  Cardiovascular:     Rate and Rhythm: Regular rhythm. Bradycardia present.     Heart sounds: Normal heart sounds. No murmur heard.    No friction rub. No gallop.     Comments: Apical pulse 58 Pulmonary:     Effort: Pulmonary effort is normal.     Breath sounds: Normal breath sounds.  Musculoskeletal:     Cervical back: Neck supple.     Right lower leg: No edema.     Left lower leg: No edema.  Skin:    General: Skin is warm and  dry.     Capillary Refill: Capillary refill takes less than 2 seconds.  Neurological:     General: No focal deficit present.     Mental Status: She is alert and oriented to person, place, and time.  Psychiatric:        Mood and Affect: Mood normal.        Behavior: Behavior normal.        Thought Content: Thought content normal.        Judgment: Judgment normal.     Results for orders placed or performed in visit on 06/27/24  Urine Culture   Collection Time: 06/27/24 12:10 PM   Specimen: Urine   UR  Result Value Ref Range   Urine Culture, Routine Final report    Organism ID, Bacteria Comment   Microscopic Examination   Collection Time: 06/27/24 12:10 PM   Urine  Result Value Ref Range   WBC, UA None seen 0 - 5 /hpf   RBC, Urine 0-2 0 - 2 /hpf   Epithelial Cells (non renal) 0-10 0 - 10 /hpf   Renal Epithel, UA None seen None seen /hpf   Bacteria, UA None seen None seen/Few   Yeast, UA None seen None seen  Urinalysis, Routine w reflex microscopic   Collection Time: 06/27/24 12:10 PM  Result Value Ref Range   Specific Gravity, UA <1.005 (L) 1.005 - 1.030   pH, UA 6.5 5.0 - 7.5   Color, UA Yellow Yellow   Appearance Ur Clear Clear   Leukocytes,UA Trace (A) Negative   Protein,UA Negative Negative/Trace   Glucose, UA Negative Negative   Ketones, UA Negative Negative   RBC, UA Trace (A) Negative   Bilirubin, UA Negative Negative   Urobilinogen, Ur 0.2 0.2 - 1.0 mg/dL   Nitrite, UA Negative Negative   Microscopic Examination See below:   Lipid panel   Collection Time: 06/27/24 12:29 PM  Result Value Ref Range   Cholesterol, Total 214 (H) 100 - 199 mg/dL   Triglycerides 827 (H) 0 - 149 mg/dL   HDL 42 >60 mg/dL   VLDL Cholesterol Cal 31 5 - 40 mg/dL   LDL Chol Calc (NIH) 858 (H) 0 - 99 mg/dL   Chol/HDL Ratio 5.1 (H) 0.0 - 4.4 ratio  CBC with Differential/Platelet   Collection Time: 06/27/24 12:29 PM  Result Value Ref Range   WBC 10.1 3.4 - 10.8 x10E3/uL   RBC 4.39  3.77 - 5.28 x10E6/uL   Hemoglobin 14.5 11.1 - 15.9 g/dL   Hematocrit 55.5 65.9 - 46.6 %   MCV 101 (H) 79 - 97 fL   MCH 33.0 26.6 - 33.0 pg   MCHC 32.7 31.5 - 35.7 g/dL   RDW 88.2 88.2 - 84.5 %   Platelets 251 150 - 450 x10E3/uL   Neutrophils 56 Not Estab. %   Lymphs 33 Not Estab. %   Monocytes 10 Not Estab. %   Eos 1 Not Estab. %   Basos 0 Not Estab. %   Neutrophils Absolute 5.6 1.4 - 7.0 x10E3/uL   Lymphocytes Absolute 3.3 (H) 0.7 - 3.1 x10E3/uL   Monocytes Absolute 1.1 (H) 0.1 - 0.9 x10E3/uL   EOS (ABSOLUTE) 0.1 0.0 - 0.4 x10E3/uL   Basophils Absolute 0.0 0.0 - 0.2 x10E3/uL   Immature Granulocytes 0 Not Estab. %   Immature Grans (Abs) 0.0 0.0 - 0.1 x10E3/uL  CMP14+EGFR   Collection Time: 06/27/24 12:29 PM  Result Value Ref Range   Glucose 94 70 - 99 mg/dL   BUN 11 8 - 27 mg/dL   Creatinine, Ser 9.06 0.57 - 1.00 mg/dL   eGFR 67 >40 fO/fpw/8.26   BUN/Creatinine Ratio 12 12 - 28   Sodium 141 134 - 144 mmol/L   Potassium 4.7 3.5 - 5.2 mmol/L   Chloride 106 96 - 106 mmol/L   CO2 20 20 - 29 mmol/L   Calcium 9.9 8.7 - 10.3 mg/dL   Total Protein 6.7 6.0 - 8.5 g/dL   Albumin 4.3 3.9 - 4.9 g/dL   Globulin, Total 2.4 1.5 - 4.5 g/dL   Bilirubin Total 0.3 0.0 - 1.2 mg/dL   Alkaline Phosphatase 85 44 - 121 IU/L   AST 17 0 - 40 IU/L   ALT 19 0 - 32 IU/L  Thyroid  Panel With TSH   Collection Time: 06/27/24 12:29 PM  Result Value Ref Range   TSH 1.750 0.450 - 4.500 uIU/mL   T4, Total 6.8 4.5 - 12.0 ug/dL   T3 Uptake Ratio 26 24 - 39 %   Free Thyroxine Index 1.8 1.2 - 4.9     EKG: SB 45, PR 174 ms, QT 434 ms, no acute ST-T changes, no ectopy.  BB dose decreased and pt aware to take BB in the morning and CCB in the evening. Will monitor HR and BP and report abnormal low or high readings. Sherry Bruns, FNP-C  Pertinent labs & imaging results that were available during my care of the patient were reviewed by me and considered in my medical decision making.  Assessment & Plan:   Sherry Salinas was seen today for atrial fibrillation.  Diagnoses and all orders for this visit:  New onset a-fib (HCC) -     EKG 12-Lead -     metoprolol  succinate (TOPROL -XL) 25 MG 24 hr tablet; Take 1 tablet (25 mg total) by mouth daily. -     ECHOCARDIOGRAM COMPLETE; Future -     Exercise Tolerance Test; Future  Primary hypertension -     metoprolol  succinate (TOPROL -XL) 25 MG 24 hr tablet; Take 1 tablet (25 mg total) by mouth daily. -     ECHOCARDIOGRAM COMPLETE; Future -     Exercise Tolerance Test; Future  Mixed hyperlipidemia -     ezetimibe  (ZETIA ) 10 MG tablet; Take 1 tablet (10 mg total) by mouth daily. -     ECHOCARDIOGRAM COMPLETE; Future -     Exercise Tolerance Test; Future  Aortic atherosclerosis (HCC) -     ezetimibe  (ZETIA ) 10 MG tablet; Take 1 tablet (10 mg total) by mouth daily. -     ECHOCARDIOGRAM COMPLETE; Future -     Exercise Tolerance Test; Future     Atrial fibrillation with palpitations and chest pain Intermittent palpitations and chest pain with episodes of elevated blood pressure and heart rate. Recent onset of atrial fibrillation with abnormal EKG. Current medication regimen includes Cardizem  for rate control, but she has been taking metoprolol  due to concerns about drug interactions. Experiences panic attacks when unable to take buspirone . Heart rate currently low, indicating potential overmedication with metoprolol . - Decrease metoprolol  dose to 25 mg. - Instruct to take metoprolol  in the morning and Cardizem  at night. - Order echocardiogram and stress test to evaluate heart function and determine cause of symptoms. - Advise to go to the hospital if experiencing significant chest pain, shortness of breath, or palpitations that do not resolve within a few minutes. - Check for Eliquis   samples to start anticoagulation therapy.  Hypertension Blood pressure readings have been elevated, with recent episodes of 168/94 and 150/85. Blood pressure improved with  metoprolol . She has been using a home blood pressure cuff, which needs to be verified for accuracy. - Instruct to bring home blood pressure cuff to office for comparison and accuracy check.  Generalized anxiety disorder Experiences anxiety and panic attacks, particularly when unable to take buspirone . Concerns about drug interactions with Cardizem  have led to altered medication timing. - Continue buspirone  as prescribed. - Reassure that buspirone  can be taken with Cardizem , but monitor for side effects.          Continue all other maintenance medications.  Follow up plan: Return if symptoms worsen or fail to improve.   Continue healthy lifestyle choices, including diet (rich in fruits, vegetables, and lean proteins, and low in salt and simple carbohydrates) and exercise (at least 30 minutes of moderate physical activity daily).  Educational handout given for A-Fib  The above assessment and management plan was discussed with the patient. The patient verbalized understanding of and has agreed to the management plan. Patient is aware to call the clinic if they develop any new symptoms or if symptoms persist or worsen. Patient is aware when to return to the clinic for a follow-up visit. Patient educated on when it is appropriate to go to the emergency department.   Sherry Bruns, FNP-C [facility] [phone]

## 2024-07-05 ENCOUNTER — Other Ambulatory Visit: Payer: Self-pay | Admitting: Family

## 2024-07-05 DIAGNOSIS — K589 Irritable bowel syndrome without diarrhea: Secondary | ICD-10-CM

## 2024-07-09 ENCOUNTER — Encounter: Payer: Self-pay | Admitting: Family Medicine

## 2024-07-09 ENCOUNTER — Ambulatory Visit: Payer: Self-pay

## 2024-07-09 ENCOUNTER — Ambulatory Visit (INDEPENDENT_AMBULATORY_CARE_PROVIDER_SITE_OTHER): Admitting: Family Medicine

## 2024-07-09 VITALS — BP 143/91 | HR 104 | Temp 97.5°F | Ht 63.0 in | Wt 138.0 lb

## 2024-07-09 DIAGNOSIS — R002 Palpitations: Secondary | ICD-10-CM

## 2024-07-09 DIAGNOSIS — F411 Generalized anxiety disorder: Secondary | ICD-10-CM | POA: Diagnosis not present

## 2024-07-09 DIAGNOSIS — I4891 Unspecified atrial fibrillation: Secondary | ICD-10-CM

## 2024-07-09 DIAGNOSIS — I10 Essential (primary) hypertension: Secondary | ICD-10-CM

## 2024-07-09 MED ORDER — METOPROLOL SUCCINATE ER 100 MG PO TB24: 100.0000 mg | ORAL_TABLET | Freq: Every day | ORAL | 1 refills | Status: DC

## 2024-07-09 MED ORDER — QUETIAPINE FUMARATE 25 MG PO TABS: 25.0000 mg | ORAL_TABLET | Freq: Every day | ORAL | 1 refills | Status: DC

## 2024-07-09 MED ORDER — BUSPIRONE HCL 7.5 MG PO TABS
ORAL_TABLET | ORAL | Status: DC
Start: 2024-07-09 — End: 2024-07-18

## 2024-07-09 NOTE — Telephone Encounter (Signed)
 PATIENT SEEN IN OFFICE THIS MORNING

## 2024-07-09 NOTE — Telephone Encounter (Signed)
 FYI Only or Action Required?: Action required by provider: update on patient condition.  Patient was last seen in primary care on 07/03/2024 by Severa Rock HERO, FNP.  Called Nurse Triage reporting Hypertension.  Symptoms began several days ago.  Interventions attempted: Prescription medications: Diltiazem , Metoprolol , hydrochlorothiazide , and eliquis .  Symptoms are: gradually worsening.  Triage Disposition: Go to ED Now (Notify PCP)  Patient/caregiver understands and will follow disposition?: Yes- Patient not opposed to ED but wants to see PCP first since this is a new medication regimen and her symptoms started shortly after. RN went ahead and scheduled with Dr. Zollie today at [phone].  Pt says she woke up this morning around 0600 and felt heart palpitations, says her PCP is aware that she is in afib.   Copied from CRM [id number]. Topic: Clinical - Red Word Triage >> Jul 09, 2024  8:06 AM Alfonso ORN wrote: Red Word that prompted transfer to Nurse Triage:  patient blood medication was changed last week and the medication is not working , patient blood pressure is going down , blood  pressure  173/90  and pulse is 86 ears ringing   pt. back # [phone] Reason for Disposition  [1] Systolic BP >= 160 OR Diastolic >= 100 AND [2] cardiac (e.g., breathing difficulty, chest pain) or neurologic symptoms (e.g., new-onset blurred or double vision, unsteady gait)  Answer Assessment - Initial Assessment Questions 1. BLOOD PRESSURE: What is your blood pressure? Did you take at least two measurements 5 minutes apart?     173/90 PR 86  2. ONSET: When did you take your blood pressure?     Taken this morning  3. HOW: How did you take your blood pressure? (e.g., automatic home BP monitor, visiting nurse)     [facility] BP Monitor  4. HISTORY: Do you have a history of high blood pressure?     High blood pressure  5. MEDICINES: Are you taking any medicines for blood pressure? Have you missed any  doses recently?     Diltiazem , metroprolol, and hydrochlorothiazide   6. OTHER SYMPTOMS: Do you have any symptoms? (e.g., blurred vision, chest pain, difficulty breathing, headache, weakness)     I just dont feel good, ears ringing, heart palpitation  7. PREGNANCY: Is there any chance you are pregnant? When was your last menstrual period?     no  Protocols used: Blood Pressure - High-A-AH

## 2024-07-09 NOTE — Progress Notes (Signed)
 Subjective:  Patient ID: Sherry Salinas, female    DOB: 1956-10-27  Age: 68 y.o. MRN: [id number]  CC: Atrial Fibrillation   HPI LAURI PURDUM presents for Recently developed new onset atrial fibrillation.  She has been concerned about the interaction between her buspirone  for anxiety and the Cardizem .  The Cardizem  and metoprolol  are both at lower doses because of concern for her blood pressure dropping too low.  She also states that the buspirone  has never been effective for her anxiety.     07/09/2024   10:12 AM 07/09/2024   10:04 AM 06/27/2024   11:22 AM  Depression screen PHQ 2/9  Decreased Interest 3 0 1  Down, Depressed, Hopeless 3 0 1  PHQ - 2 Score 6 0 2  Altered sleeping 3  3  Tired, decreased energy 3  2  Change in appetite 0  0  Feeling bad or failure about yourself  1  0  Trouble concentrating 3  2  Moving slowly or fidgety/restless 3  0  Suicidal thoughts 0  0  PHQ-9 Score 19  9  Difficult doing work/chores Somewhat difficult  Somewhat difficult      07/09/2024   10:13 AM 06/27/2024   11:22 AM 02/17/2024   10:24 AM 12/29/2023   10:44 AM  GAD 7 : Generalized Anxiety Score  Nervous, Anxious, on Edge 3 3 3  0  Control/stop worrying 3 3 3  0  Worry too much - different things 3 3 3  0  Trouble relaxing 3 3 3 1   Restless 3 3 3 1   Easily annoyed or irritable 3 3 3  0  Afraid - awful might happen 3 1 3  0  Total GAD 7 Score 21 19 21 2   Anxiety Difficulty Very difficult Somewhat difficult Somewhat difficult Somewhat difficult     History Lacee has a past medical history of Anxiety, Aortic atherosclerosis (HCC), Arthritis, Hyperlipidemia, Hypertension, IBS (irritable bowel syndrome), Osteopenia (01/08/2021), Prediabetes (01/18/2022), and Vitamin D  deficiency (09/14/2017).   She has a past surgical history that includes Replacement total knee (Right, 2012); Rotator cuff repair (Right); Colonoscopy with esophagogastroduodenoscopy (egd) (02/2003); Colonoscopy (2015);  Colonoscopy with propofol  (N/A, 06/23/2021); and Polypectomy (06/23/2021).   Her family history includes Arthritis in her mother; Colon cancer in her paternal uncle; Early death in her paternal grandfather and paternal grandmother; Heart attack in her father; Heart disease in her father; Kidney failure in her mother; Other in her brother; Rheum arthritis in her mother; Stroke in her father and maternal grandfather.She reports that she has been smoking cigarettes. She has a 30 pack-year smoking history. She has never used smokeless tobacco. She reports that she does not drink alcohol and does not use drugs.    ROS Review of Systems  Constitutional: Negative.   HENT:  Negative for congestion.   Eyes:  Negative for visual disturbance.  Respiratory:  Negative for shortness of breath.   Cardiovascular:  Positive for palpitations. Negative for chest pain.  Gastrointestinal:  Negative for abdominal pain, constipation, diarrhea, nausea and vomiting.  Genitourinary:  Negative for difficulty urinating.  Musculoskeletal:  Negative for arthralgias and myalgias.  Neurological:  Negative for headaches.  Psychiatric/Behavioral:  Positive for agitation and dysphoric mood. Negative for sleep disturbance. The patient is nervous/anxious.     Objective:  BP (!) 143/91   Pulse (!) 104   Temp (!) 97.5 F (36.4 C)   Ht 5' 3 (1.6 m)   Wt 138 lb (62.6 kg)   SpO2 97%  BMI 24.45 kg/m   BP Readings from Last 3 Encounters:  07/09/24 (!) 143/91  07/03/24 130/82  06/27/24 130/77    Wt Readings from Last 3 Encounters:  07/09/24 138 lb (62.6 kg)  07/03/24 139 lb (63 kg)  06/27/24 141 lb (64 kg)     Physical Exam Constitutional:      General: She is not in acute distress.    Appearance: She is well-developed.  HENT:     Head: Normocephalic and atraumatic.  Eyes:     Conjunctiva/sclera: Conjunctivae normal.     Pupils: Pupils are equal, round, and reactive to light.  Neck:     Thyroid : No  thyromegaly.  Cardiovascular:     Rate and Rhythm: Normal rate. Rhythm irregular.     Heart sounds: Normal heart sounds. No murmur heard. Pulmonary:     Effort: Pulmonary effort is normal. No respiratory distress.     Breath sounds: Normal breath sounds. No wheezing or rales.  Abdominal:     General: Bowel sounds are normal. There is no distension.     Palpations: Abdomen is soft.     Tenderness: There is no abdominal tenderness.  Musculoskeletal:        General: Normal range of motion.     Cervical back: Normal range of motion and neck supple.  Lymphadenopathy:     Cervical: No cervical adenopathy.  Skin:    General: Skin is warm and dry.  Neurological:     Mental Status: She is alert and oriented to person, place, and time.  Psychiatric:        Behavior: Behavior normal.        Thought Content: Thought content normal.        Judgment: Judgment normal.      Assessment & Plan:  Palpitations -     EKG 12-Lead  GAD (generalized anxiety disorder) -     busPIRone  HCl; Decrease by one daily every 3 days.Then DC.  New onset a-fib (HCC) -     Metoprolol  Succinate ER; Take 1 tablet (100 mg total) by mouth daily.  Dispense: 90 tablet; Refill: 1  Primary hypertension -     Metoprolol  Succinate ER; Take 1 tablet (100 mg total) by mouth daily.  Dispense: 90 tablet; Refill: 1  Other orders -     QUEtiapine  Fumarate; Take 1 tablet (25 mg total) by mouth at bedtime.  Dispense: 90 tablet; Refill: 1     Follow-up: Return in about 2 weeks (around 07/23/2024) for Anxietywith PCP.  Butler Der, M.D.

## 2024-07-10 ENCOUNTER — Ambulatory Visit: Payer: Self-pay | Admitting: Family Medicine

## 2024-07-10 ENCOUNTER — Ambulatory Visit: Payer: Self-pay | Admitting: *Deleted

## 2024-07-10 NOTE — Telephone Encounter (Signed)
 FYI Only or Action Required?: Action required by provider: clinical question for provider.  Patient was last seen in primary care on 07/09/2024 by Zollie Lowers, MD.  Called Nurse Triage reporting Medication Problem.  Symptoms began several days ago.  Interventions attempted: Prescription medications: Eliquis  and metoprolol  .  Symptoms are: unchanged.  Triage Disposition: Call PCP Now  Patient/caregiver understands and will follow disposition?: Patient has follow up questions for her PCP- would like call back.    Reason for Disposition  [1] Caller has URGENT medicine question about med that primary care doctor (or NP/PA) or specialist prescribed AND [2] triager unable to answer question  Answer Assessment - Initial Assessment Questions 1. NAME of MEDICINE: What medicine(s) are you calling about?     Eliquis   2. QUESTION: What is your question? (e.g., double dose of medicine, side effect)     SE- headache, fatigue 3. PRESCRIBER: Who prescribed the medicine? Reason: if prescribed by specialist, call should be referred to that group.     PCP 4. SYMPTOMS: Do you have any symptoms? If Yes, ask: What symptoms are you having?  How bad are the symptoms (e.g., mild, moderate, severe)      Patient reports she had symptoms within 2-3 hors of taking medication- patient has nausea, headache every day since starting the medication.   1:Patient would like to know if she has any other alternatives 2: Patient needs appointment for follow up and provider does not have opening.(Patient is unable to come to office Wednesday- husband has surgery- can she reschedule with her PCP) 3:Patient was given Metoprolol  Succinate 100 mg Oral Daily- Patient states she took dose and it did bring BP down- today 117/77- patient states she went back to her original dose- please clarify what dose she needs to continue to take.  Protocols used: Medication Question Call-A-AH    Copied from CRM #[id number].  Topic: Clinical - Red Word Triage >> Jul 10, 2024  2:04 PM Elle L wrote: Red Word that prompted transfer to Nurse Triage: The patient has a severe headache and fatigue from her Eliquis .

## 2024-07-10 NOTE — Telephone Encounter (Signed)
 Returned patients call - and states she already has an appointment

## 2024-07-11 ENCOUNTER — Telehealth: Payer: Self-pay

## 2024-07-11 ENCOUNTER — Telehealth: Payer: Self-pay | Admitting: Family Medicine

## 2024-07-11 NOTE — Telephone Encounter (Signed)
 Copied from CRM [id number]. Topic: Clinical - Medication Question >> Jul 11, 2024  9:23 AM Larissa RAMAN wrote: Reason for CRM: Harlene with [facility] pharmacy has questions regarding metoprol

## 2024-07-11 NOTE — Telephone Encounter (Signed)
 Stacy, pharmacy tech calling to clarify metoprolol  dose patient should be taking. [facility] pharmacy medication list shows orders for metoprolol  25 mg and 50 mg to be taken. Last dose of metoprolol  25 mg was already sent to patient on 07/05/24 from [facility] pharmacy. On 07/09/24 metoprolol  dose was ordered as 100 mg total to be taken once daily and sent to [facility] pharmacy in [place]. Please advise / confirm metoprolol  25 mg discontinued , and patient is to only to take 100 mg daily.  Patient needs to be contacted after PCP clarifies correct dose patient is to be taking. See previous medication question from yesterday regarding eliquis . CAL called and call can not be completed as dialed.

## 2024-07-11 NOTE — Telephone Encounter (Signed)
 Fax sent back to [facility] that dose was increased to 100 mg at 07/09/24 visit and this was sent into local [facility] pharmacy

## 2024-07-11 NOTE — Telephone Encounter (Signed)
 Copied from CRM [id number]. Topic: Clinical - Medical Advice >> Jul 11, 2024 12:08 PM Shamecia H wrote: Reason for CRM: Patient called and she is having issues still with her blood pressure and she stated it came down to 133/88 but her heart rate is 104. She is needing to know what to do. She does have an appointment 08/13 but needs to know what she can do in the meantime. Can you assist the patient? Callback number is [phone].

## 2024-07-11 NOTE — Telephone Encounter (Signed)
Returned patient's call and answered questions

## 2024-07-12 DIAGNOSIS — R0789 Other chest pain: Secondary | ICD-10-CM | POA: Diagnosis not present

## 2024-07-12 DIAGNOSIS — R002 Palpitations: Secondary | ICD-10-CM | POA: Diagnosis not present

## 2024-07-16 ENCOUNTER — Ambulatory Visit: Admitting: Nurse Practitioner

## 2024-07-18 ENCOUNTER — Ambulatory Visit (INDEPENDENT_AMBULATORY_CARE_PROVIDER_SITE_OTHER): Admitting: Family Medicine

## 2024-07-18 ENCOUNTER — Other Ambulatory Visit (HOSPITAL_COMMUNITY)

## 2024-07-18 ENCOUNTER — Encounter: Payer: Self-pay | Admitting: Family Medicine

## 2024-07-18 VITALS — BP 123/82 | HR 118 | Temp 97.3°F | Ht 63.0 in | Wt 140.8 lb

## 2024-07-18 DIAGNOSIS — K529 Noninfective gastroenteritis and colitis, unspecified: Secondary | ICD-10-CM

## 2024-07-18 DIAGNOSIS — R7309 Other abnormal glucose: Secondary | ICD-10-CM | POA: Diagnosis not present

## 2024-07-18 DIAGNOSIS — F411 Generalized anxiety disorder: Secondary | ICD-10-CM

## 2024-07-18 DIAGNOSIS — I4891 Unspecified atrial fibrillation: Secondary | ICD-10-CM | POA: Diagnosis not present

## 2024-07-18 MED ORDER — BUSPIRONE HCL 15 MG PO TABS: 15.0000 mg | ORAL_TABLET | Freq: Three times a day (TID) | ORAL | 3 refills | Status: DC

## 2024-07-18 MED ORDER — AMOXICILLIN-POT CLAVULANATE 875-125 MG PO TABS: 1.0000 | ORAL_TABLET | Freq: Two times a day (BID) | ORAL | 0 refills | Status: DC

## 2024-07-18 MED ORDER — RIVAROXABAN 20 MG PO TABS: 20.0000 mg | ORAL_TABLET | Freq: Every day | ORAL | 3 refills | Status: DC

## 2024-07-18 NOTE — Progress Notes (Signed)
 Subjective:  Patient ID: Sherry Salinas, female    DOB: Apr 19, 1956, 68 y.o.   MRN: [id number]  Patient Care Team: Severa Rock HERO, FNP as PCP - General (Family Medicine) Shaaron Lamar HERO, MD as Consulting Physician (Gastroenterology) Billee Mliss JONETTA, [facility] as [facility] Care Management (Pharmacist) [facility]   Chief Complaint:  Anxiety (1 week follow up )   HPI: Sherry Salinas is a 68 y.o. female presenting on 07/18/2024 for Anxiety (1 week follow up )   Sherry Salinas is a 68 year old female with anxiety and atrial fibrillation who presents with medication management concerns.  She did not start taking Seroquel  as previously recommended, feeling it was not the right medication for her. She continues to take buspirone  (Buspar ) three times a day for anxiety but feels it may not be fully effective as her anxiety remains high on some days. She has previously tried SSRIs and SNRIs without success when taken alone.  She was switched from Cardizem  to 100 mg of metoprolol  for heart rate control. Her heart rate has been variable, with the lowest being 87 bpm and the highest reaching 118 bpm. She previously liked Cardizem  as it managed her symptoms well overnight and into the morning, but it was stopped due to the introduction of metoprolol . She has a follow-up with cardiology scheduled for October 16th.  She is currently taking Eliquis  but reports adverse effects including stomach upset, headaches, and severe night sweats. She also experiences constipation, which she attributes to Eliquis , and has been managing it with daily medication. She has noticed increased sweating at night, requiring her to change linens frequently.  She reports a new onset of burning and itching on her leg, specifically around the area of her knee replacement, which has developed into a sore knot. She is concerned about the possibility of a blood clot after developing a sore  knot in the area, but reports that she has been scratching the area.  She mentions a flare-up of hemorrhoids, which she manages with cream, and suspects a possible colon issue. She has experienced diarrhea and urinary symptoms, which she associates with a previous episode of gastroenteritis. She has some leftover medication from a previous prescription but has not completed the course.          Relevant past medical, surgical, family, and social history reviewed and updated as indicated.  Allergies and medications reviewed and updated. Data reviewed: Chart in Epic.   Past Medical History:  Diagnosis Date   Anxiety    Aortic atherosclerosis (HCC)    Arthritis    Hyperlipidemia    Hypertension    IBS (irritable bowel syndrome)    Osteopenia 01/08/2021   Prediabetes 01/18/2022   Vitamin D  deficiency 09/14/2017    Past Surgical History:  Procedure Laterality Date   COLONOSCOPY  2015   Dr. Dyane: per pt, diverticulosis, 3 polyps, come back in five years.    COLONOSCOPY WITH ESOPHAGOGASTRODUODENOSCOPY (EGD)  02/2003   Dr. Dyane: Grade 3 esophagitis, hiatal hernia with patulous GE junction, mild duodenitis.  CLOtest, results unavailable.  Colonoscopy normal.   COLONOSCOPY WITH PROPOFOL  N/A 06/23/2021   Procedure: COLONOSCOPY WITH PROPOFOL ;  Surgeon: Cindie Carlin POUR, DO;  Location: [facility];  Service: Endoscopy;  Laterality: N/A;  10:30am   POLYPECTOMY  06/23/2021   Procedure: POLYPECTOMY INTESTINAL;  Surgeon: Cindie Carlin POUR, DO;  Location: [facility];  Service: Endoscopy;;   REPLACEMENT TOTAL  KNEE Right 2012   ROTATOR CUFF REPAIR Right     Social History   Socioeconomic History   Marital status: Married    Spouse name: Not on file   Number of children: 2   Years of education: Not on file   Highest education level: Associate degree: occupational, Scientist, product/process development, or vocational program  Occupational History   Occupation: Retired    Comment: Previously EMT, FF, and RN   Tobacco Use   Smoking status: Every Day    Current packs/day: 1.00    Average packs/day: 1 pack/day for 30.0 years (30.0 ttl pk-yrs)    Types: Cigarettes   Smokeless tobacco: Never  Vaping Use   Vaping status: Never Used  Substance and Sexual Activity   Alcohol use: Never   Drug use: Never   Sexual activity: Not on file  Other Topics Concern   Not on file  Social History Narrative   Lives with husband, son and granddaughter    Social Drivers of Health   Financial Resource Strain: Medium Risk (06/27/2024)   Overall Financial Resource Strain (CARDIA)    Difficulty of Paying Living Expenses: Somewhat hard  Food Insecurity: No Food Insecurity (06/27/2024)   Hunger Vital Sign    Worried About Running Out of Food in the Last Year: Never true    Ran Out of Food in the Last Year: Never true  Transportation Needs: No Transportation Needs (06/27/2024)   PRAPARE - Administrator, Civil Service (Medical): No    Lack of Transportation (Non-Medical): No  Physical Activity: Insufficiently Active (06/27/2024)   Exercise Vital Sign    Days of Exercise per Week: 5 days    Minutes of Exercise per Session: 20 min  Stress: Stress Concern Present (06/27/2024)   [employer] of Occupational Health - Occupational Stress Questionnaire    Feeling of Stress: Very much  Social Connections: Moderately Integrated (06/27/2024)   Social Connection and Isolation Panel    Frequency of Communication with Friends and Family: More than three times a week    Frequency of Social Gatherings with Friends and Family: More than three times a week    Attends Religious Services: More than 4 times per year    Active Member of [employer] or Organizations: No    Attends Banker Meetings: Not on file    Marital Status: Married  Catering manager Violence: Not At Risk (02/07/2024)   Humiliation, Afraid, Rape, and Kick questionnaire    Fear of Current or Ex-Partner: No    Emotionally Abused: No     Physically Abused: No    Sexually Abused: No    Outpatient Encounter Medications as of 07/18/2024  Medication Sig   albuterol  (VENTOLIN  HFA) 108 (90 Base) MCG/ACT inhaler Inhale 2 puffs into the lungs every 6 (six) hours as needed for wheezing or shortness of breath.   amoxicillin -clavulanate (AUGMENTIN ) 875-125 MG tablet Take 1 tablet by mouth 2 (two) times daily.   busPIRone  (BUSPAR ) 15 MG tablet Take 1 tablet (15 mg total) by mouth 3 (three) times daily.   Calcium-Magnesium-Zinc (CAL-MAG-ZINC PO) Take 1 tablet by mouth daily.   cholecalciferol (VITAMIN D3) 25 MCG (1000 UNIT) tablet Take 2,000 Units by mouth daily.   dicyclomine  (BENTYL ) 20 MG tablet TAKE 1 TABLET THREE TIMES DAILY   EPINEPHrine  0.3 mg/0.3 mL IJ SOAJ injection Inject 0.3 mg into the muscle as needed for anaphylaxis.   ezetimibe  (ZETIA ) 10 MG tablet Take 1 tablet (10 mg total) by mouth daily.  hydrochlorothiazide  (HYDRODIURIL ) 25 MG tablet Take 1 tablet (25 mg total) by mouth daily.   levocetirizine (XYZAL ) 5 MG tablet Take 1 tablet (5 mg total) by mouth every evening.   methocarbamol  (ROBAXIN ) 500 MG tablet Take 1 tablet (500 mg total) by mouth every 8 (eight) hours as needed for muscle spasms.   metoprolol  succinate (TOPROL -XL) 100 MG 24 hr tablet Take 1 tablet (100 mg total) by mouth daily.   mupirocin  ointment (BACTROBAN ) 2 % APPLY 1 APPLICATION TOPICALLY 2 (TWO) TIMES DAILY.   ondansetron  (ZOFRAN ) 4 MG tablet Take 1 tablet (4 mg total) by mouth every 8 (eight) hours as needed for nausea or vomiting.   pantoprazole  (PROTONIX ) 40 MG tablet Take 1 tablet (40 mg total) by mouth daily.   pravastatin  (PRAVACHOL ) 10 MG tablet Take 1 tablet (10 mg total) by mouth daily.   pyridOXINE (VITAMIN B6) 100 MG tablet Take 200 mg by mouth daily.   rivaroxaban  (XARELTO ) 20 MG TABS tablet Take 1 tablet (20 mg total) by mouth daily with supper.   traMADol  (ULTRAM ) 50 MG tablet Take 1 tablet (50 mg total) by mouth 2 (two) times daily.    traMADol  (ULTRAM ) 50 MG tablet Take 1 tablet (50 mg total) by mouth 2 (two) times daily.   traMADol  (ULTRAM ) 50 MG tablet Take 1 tablet (50 mg total) by mouth 2 (two) times daily.   [START ON 08/26/2024] traMADol  (ULTRAM ) 50 MG tablet Take 1 tablet (50 mg total) by mouth 2 (two) times daily.   [START ON 07/27/2024] traMADol  (ULTRAM ) 50 MG tablet Take 1 tablet (50 mg total) by mouth 2 (two) times daily.   triamcinolone  cream (KENALOG ) 0.1 % APPLY TOPICALLY TWO TIMES DAILY AS NEEDED   [DISCONTINUED] apixaban  (ELIQUIS ) 5 MG TABS tablet Take 1 tablet (5 mg total) by mouth 2 (two) times daily.   [DISCONTINUED] busPIRone  (BUSPAR ) 7.5 MG tablet Decrease by one daily every 3 days.Then DC. (Patient taking differently: Take 7.5 mg by mouth 3 (three) times daily. Decrease by one daily every 3 days.Then DC.)   [DISCONTINUED] QUEtiapine  (SEROQUEL ) 25 MG tablet Take 1 tablet (25 mg total) by mouth at bedtime. (Patient not taking: Reported on 07/18/2024)   No facility-administered encounter medications on file as of 07/18/2024.    Allergies  Allergen Reactions   Bee Venom Anaphylaxis   Codeine Anaphylaxis   Statins Other (See Comments)    Muscle aches   Anusol-Hc [Hydrocortisone]     Rash/itching, all hemorrhoid creams    Pertinent ROS per HPI, otherwise unremarkable      Objective:  BP 123/82   Pulse (!) 118   Temp (!) 97.3 F (36.3 C)   Ht 5' 3 (1.6 m)   Wt 140 lb 12.8 oz (63.9 kg)   SpO2 97%   BMI 24.94 kg/m    Wt Readings from Last 3 Encounters:  07/18/24 140 lb 12.8 oz (63.9 kg)  07/09/24 138 lb (62.6 kg)  07/03/24 139 lb (63 kg)    Physical Exam Vitals and nursing note reviewed.  Constitutional:      General: She is not in acute distress.    Appearance: Normal appearance. She is normal weight. She is not ill-appearing, toxic-appearing or diaphoretic.  HENT:     Head: Normocephalic and atraumatic.     Mouth/Throat:     Mouth: Mucous membranes are moist.     Pharynx: Oropharynx  is clear.  Eyes:     Conjunctiva/sclera: Conjunctivae normal.     Pupils: Pupils  are equal, round, and reactive to light.  Cardiovascular:     Rate and Rhythm: Tachycardia present. Rhythm irregularly irregular.     Heart sounds: Normal heart sounds.  Pulmonary:     Effort: Pulmonary effort is normal.     Breath sounds: Normal breath sounds.  Abdominal:     General: Bowel sounds are normal.     Palpations: Abdomen is soft.     Tenderness: There is no abdominal tenderness.  Musculoskeletal:     Right lower leg: No edema.     Left lower leg: No edema.  Skin:    General: Skin is warm and dry.     Capillary Refill: Capillary refill takes less than 2 seconds.  Neurological:     General: No focal deficit present.     Mental Status: She is alert and oriented to person, place, and time.  Psychiatric:        Mood and Affect: Mood normal.        Behavior: Behavior normal.        Thought Content: Thought content normal.        Judgment: Judgment normal.     Results for orders placed or performed in visit on 06/27/24  Urine Culture   Collection Time: 06/27/24 12:10 PM   Specimen: Urine   UR  Result Value Ref Range   Urine Culture, Routine Final report    Organism ID, Bacteria Comment   Microscopic Examination   Collection Time: 06/27/24 12:10 PM   Urine  Result Value Ref Range   WBC, UA None seen 0 - 5 /hpf   RBC, Urine 0-2 0 - 2 /hpf   Epithelial Cells (non renal) 0-10 0 - 10 /hpf   Renal Epithel, UA None seen None seen /hpf   Bacteria, UA None seen None seen/Few   Yeast, UA None seen None seen  Urinalysis, Routine w reflex microscopic   Collection Time: 06/27/24 12:10 PM  Result Value Ref Range   Specific Gravity, UA <1.005 (L) 1.005 - 1.030   pH, UA 6.5 5.0 - 7.5   Color, UA Yellow Yellow   Appearance Ur Clear Clear   Leukocytes,UA Trace (A) Negative   Protein,UA Negative Negative/Trace   Glucose, UA Negative Negative   Ketones, UA Negative Negative   RBC, UA Trace  (A) Negative   Bilirubin, UA Negative Negative   Urobilinogen, Ur 0.2 0.2 - 1.0 mg/dL   Nitrite, UA Negative Negative   Microscopic Examination See below:   Lipid panel   Collection Time: 06/27/24 12:29 PM  Result Value Ref Range   Cholesterol, Total 214 (H) 100 - 199 mg/dL   Triglycerides 827 (H) 0 - 149 mg/dL   HDL 42 >60 mg/dL   VLDL Cholesterol Cal 31 5 - 40 mg/dL   LDL Chol Calc (NIH) 858 (H) 0 - 99 mg/dL   Chol/HDL Ratio 5.1 (H) 0.0 - 4.4 ratio  CBC with Differential/Platelet   Collection Time: 06/27/24 12:29 PM  Result Value Ref Range   WBC 10.1 3.4 - 10.8 x10E3/uL   RBC 4.39 3.77 - 5.28 x10E6/uL   Hemoglobin 14.5 11.1 - 15.9 g/dL   Hematocrit 55.5 65.9 - 46.6 %   MCV 101 (H) 79 - 97 fL   MCH 33.0 26.6 - 33.0 pg   MCHC 32.7 31.5 - 35.7 g/dL   RDW 88.2 88.2 - 84.5 %   Platelets 251 150 - 450 x10E3/uL   Neutrophils 56 Not Estab. %   Lymphs 33  Not Estab. %   Monocytes 10 Not Estab. %   Eos 1 Not Estab. %   Basos 0 Not Estab. %   Neutrophils Absolute 5.6 1.4 - 7.0 x10E3/uL   Lymphocytes Absolute 3.3 (H) 0.7 - 3.1 x10E3/uL   Monocytes Absolute 1.1 (H) 0.1 - 0.9 x10E3/uL   EOS (ABSOLUTE) 0.1 0.0 - 0.4 x10E3/uL   Basophils Absolute 0.0 0.0 - 0.2 x10E3/uL   Immature Granulocytes 0 Not Estab. %   Immature Grans (Abs) 0.0 0.0 - 0.1 x10E3/uL  CMP14+EGFR   Collection Time: 06/27/24 12:29 PM  Result Value Ref Range   Glucose 94 70 - 99 mg/dL   BUN 11 8 - 27 mg/dL   Creatinine, Ser 9.06 0.57 - 1.00 mg/dL   eGFR 67 >40 fO/fpw/8.26   BUN/Creatinine Ratio 12 12 - 28   Sodium 141 134 - 144 mmol/L   Potassium 4.7 3.5 - 5.2 mmol/L   Chloride 106 96 - 106 mmol/L   CO2 20 20 - 29 mmol/L   Calcium 9.9 8.7 - 10.3 mg/dL   Total Protein 6.7 6.0 - 8.5 g/dL   Albumin 4.3 3.9 - 4.9 g/dL   Globulin, Total 2.4 1.5 - 4.5 g/dL   Bilirubin Total 0.3 0.0 - 1.2 mg/dL   Alkaline Phosphatase 85 44 - 121 IU/L   AST 17 0 - 40 IU/L   ALT 19 0 - 32 IU/L  Thyroid  Panel With TSH   Collection  Time: 06/27/24 12:29 PM  Result Value Ref Range   TSH 1.750 0.450 - 4.500 uIU/mL   T4, Total 6.8 4.5 - 12.0 ug/dL   T3 Uptake Ratio 26 24 - 39 %   Free Thyroxine Index 1.8 1.2 - 4.9       Pertinent labs & imaging results that were available during my care of the patient were reviewed by me and considered in my medical decision making.  Assessment & Plan:  Larken was seen today for anxiety.  Diagnoses and all orders for this visit:  New onset a-fib (HCC) -     Amb Referral to AFIB Clinic -     CBC with Differential/Platelet -     Thyroid  Panel With TSH -     CMP14+EGFR -     rivaroxaban  (XARELTO ) 20 MG TABS tablet; Take 1 tablet (20 mg total) by mouth daily with supper.  GAD (generalized anxiety disorder) -     busPIRone  (BUSPAR ) 15 MG tablet; Take 1 tablet (15 mg total) by mouth 3 (three) times daily. -     CBC with Differential/Platelet -     Thyroid  Panel With TSH -     CMP14+EGFR  Gastroenteritis -     CBC with Differential/Platelet -     CMP14+EGFR -     amoxicillin -clavulanate (AUGMENTIN ) 875-125 MG tablet; Take 1 tablet by mouth 2 (two) times daily.     Atrial fibrillation Atrial fibrillation managed with apixaban , with adverse effects including stomach upset, headache, night sweats, and potential drug-induced constipation. Considering switch to rivaroxaban  due to intolerance.  Hypertension Hypertension managed with metoprolol  100 mg daily. Blood pressure is controlled, but heart rate remains elevated. Prefers not to switch back to Cardizem  unless advised by cardiology. - Continue metoprolol  100 mg daily. - Refer to AFib clinic for further evaluation and management.  Anxiety disorder Anxiety disorder managed with buspirone  7.5 mg three times daily, not fully controlling symptoms. Discussed potential increase in dosage. Seroquel  was prescribed by another provider but not started due to  concerns. - Increase buspirone  to 15 mg three times daily. - Instruct to take  two of the current 7.5 mg tablets three times daily until new prescription is received.  Suspected urinary tract infection and gastroenteritis Reports symptoms suggestive of gastroenteritis and urinary tract infection. Previous urine culture showed no bacteria. Augmentin  is preferred for its efficacy in treating both conditions. - Prescribe Augmentin  for suspected urinary tract infection and gastroenteritis. - Send Augmentin  prescription to [facility].  Hemorrhoids with drug-induced constipation Reports hemorrhoids and constipation, potentially exacerbated by apixaban . Uses topical cream for hemorrhoid management. - Continue using topical cream for hemorrhoids. - Monitor bowel movements and manage constipation as needed.     Continue all other maintenance medications.  Follow up plan: Return if symptoms worsen or fail to improve.   Continue healthy lifestyle choices, including diet (rich in fruits, vegetables, and lean proteins, and low in salt and simple carbohydrates) and exercise (at least 30 minutes of moderate physical activity daily).  Educational handout given for A-Fib  The above assessment and management plan was discussed with the patient. The patient verbalized understanding of and has agreed to the management plan. Patient is aware to call the clinic if they develop any new symptoms or if symptoms persist or worsen. Patient is aware when to return to the clinic for a follow-up visit. Patient educated on when it is appropriate to go to the emergency department.   Rosaline Bruns, FNP-C [facility] [phone]

## 2024-07-19 ENCOUNTER — Telehealth: Payer: Self-pay

## 2024-07-19 ENCOUNTER — Ambulatory Visit: Payer: Self-pay | Admitting: Family Medicine

## 2024-07-19 LAB — CBC WITH DIFFERENTIAL/PLATELET
Basophils Absolute: 0 x10E3/uL (ref 0.0–0.2)
Basos: 0 %
EOS (ABSOLUTE): 0.2 x10E3/uL (ref 0.0–0.4)
Eos: 2 %
Hematocrit: 45.9 % (ref 34.0–46.6)
Hemoglobin: 15.1 g/dL (ref 11.1–15.9)
Immature Grans (Abs): 0 x10E3/uL (ref 0.0–0.1)
Immature Granulocytes: 0 %
Lymphocytes Absolute: 3.5 x10E3/uL — ABNORMAL HIGH (ref 0.7–3.1)
Lymphs: 34 %
MCH: 33.3 pg — ABNORMAL HIGH (ref 26.6–33.0)
MCHC: 32.9 g/dL (ref 31.5–35.7)
MCV: 101 fL — ABNORMAL HIGH (ref 79–97)
Monocytes Absolute: 0.8 x10E3/uL (ref 0.1–0.9)
Monocytes: 8 %
Neutrophils Absolute: 5.8 x10E3/uL (ref 1.4–7.0)
Neutrophils: 56 %
Platelets: 286 x10E3/uL (ref 150–450)
RBC: 4.54 x10E6/uL (ref 3.77–5.28)
RDW: 11.8 % (ref 11.7–15.4)
WBC: 10.4 x10E3/uL (ref 3.4–10.8)

## 2024-07-19 LAB — THYROID PANEL WITH TSH
Free Thyroxine Index: 1.6 (ref 1.2–4.9)
T3 Uptake Ratio: 25 % (ref 24–39)
T4, Total: 6.4 ug/dL (ref 4.5–12.0)
TSH: 1.82 u[IU]/mL (ref 0.450–4.500)

## 2024-07-19 LAB — CMP14+EGFR
ALT: 17 IU/L (ref 0–32)
AST: 20 IU/L (ref 0–40)
Albumin: 4.1 g/dL (ref 3.9–4.9)
Alkaline Phosphatase: 88 IU/L (ref 44–121)
BUN/Creatinine Ratio: 14 (ref 12–28)
BUN: 16 mg/dL (ref 8–27)
Bilirubin Total: <0.2 mg/dL (ref 0.0–1.2)
CO2: 21 mmol/L (ref 20–29)
Calcium: 9.8 mg/dL (ref 8.7–10.3)
Chloride: 103 mmol/L (ref 96–106)
Creatinine, Ser: 1.11 mg/dL — ABNORMAL HIGH (ref 0.57–1.00)
Globulin, Total: 2.4 g/dL (ref 1.5–4.5)
Glucose: 138 mg/dL — ABNORMAL HIGH (ref 70–99)
Potassium: 4.2 mmol/L (ref 3.5–5.2)
Sodium: 140 mmol/L (ref 134–144)
Total Protein: 6.5 g/dL (ref 6.0–8.5)
eGFR: 54 mL/min/1.73 — ABNORMAL LOW (ref >59)

## 2024-07-19 NOTE — Telephone Encounter (Signed)
 Reviewed labs with patient and no more questions.

## 2024-07-19 NOTE — Telephone Encounter (Signed)
 Copied from CRM [id number]. Topic: Clinical - Lab/Test Results >> Jul 19, 2024 10:46 AM Sherry Salinas T wrote: Reason for CRM: patient called requested a call back from Sherry Salinas to discuss her labs again

## 2024-07-20 LAB — SPECIMEN STATUS REPORT

## 2024-07-20 LAB — HGB A1C W/O EAG: Hgb A1c MFr Bld: 5.9 % — ABNORMAL HIGH (ref 4.8–5.6)

## 2024-07-22 ENCOUNTER — Other Ambulatory Visit: Payer: Self-pay | Admitting: Family Medicine

## 2024-07-22 DIAGNOSIS — H60542 Acute eczematoid otitis externa, left ear: Secondary | ICD-10-CM

## 2024-07-23 ENCOUNTER — Ambulatory Visit (HOSPITAL_COMMUNITY)

## 2024-08-09 ENCOUNTER — Encounter (HOSPITAL_COMMUNITY): Payer: Self-pay

## 2024-08-09 ENCOUNTER — Ambulatory Visit (HOSPITAL_COMMUNITY): Admitting: Physician Assistant

## 2024-08-16 ENCOUNTER — Other Ambulatory Visit: Payer: Self-pay | Admitting: Family Medicine

## 2024-08-16 DIAGNOSIS — I4891 Unspecified atrial fibrillation: Secondary | ICD-10-CM

## 2024-08-17 ENCOUNTER — Encounter: Payer: Self-pay | Admitting: Family Medicine

## 2024-08-17 ENCOUNTER — Ambulatory Visit: Admitting: Family Medicine

## 2024-08-17 VITALS — BP 150/89 | HR 67 | Temp 97.7°F | Ht 63.0 in | Wt 141.0 lb

## 2024-08-17 DIAGNOSIS — I4891 Unspecified atrial fibrillation: Secondary | ICD-10-CM | POA: Diagnosis not present

## 2024-08-17 DIAGNOSIS — R6883 Chills (without fever): Secondary | ICD-10-CM

## 2024-08-17 DIAGNOSIS — N760 Acute vaginitis: Secondary | ICD-10-CM

## 2024-08-17 DIAGNOSIS — J069 Acute upper respiratory infection, unspecified: Secondary | ICD-10-CM | POA: Diagnosis not present

## 2024-08-17 DIAGNOSIS — R6889 Other general symptoms and signs: Secondary | ICD-10-CM | POA: Diagnosis not present

## 2024-08-17 LAB — VERITOR FLU A/B WAIVED
Influenza A: NEGATIVE
Influenza B: NEGATIVE

## 2024-08-17 MED ORDER — DILTIAZEM HCL ER COATED BEADS 120 MG PO CP24: 120.0000 mg | ORAL_CAPSULE | Freq: Every day | ORAL | 0 refills | Status: DC

## 2024-08-17 MED ORDER — METOPROLOL SUCCINATE ER 25 MG PO TB24: 25.0000 mg | ORAL_TABLET | Freq: Every day | ORAL | 3 refills | Status: AC

## 2024-08-17 MED ORDER — FLUTICASONE PROPIONATE 50 MCG/ACT NA SUSP: 2.0000 | Freq: Every day | NASAL | 6 refills | Status: AC

## 2024-08-17 MED ORDER — FLUCONAZOLE 150 MG PO TABS: 150.0000 mg | ORAL_TABLET | Freq: Once | ORAL | 0 refills | Status: AC

## 2024-08-17 NOTE — Addendum Note (Signed)
 Addended by: Arshawn Valdez D on: 08/17/2024 04:16 PM   Modules accepted: Orders

## 2024-08-17 NOTE — Progress Notes (Signed)
 Refill failed. resent

## 2024-08-17 NOTE — Patient Instructions (Signed)
 Coricidin HBP.

## 2024-08-17 NOTE — Progress Notes (Signed)
 Subjective:  Patient ID: Sherry Salinas, female    DOB: 09-Mar-1956, 68 y.o.   MRN: [id number]  Patient Care Team: Severa Rock HERO, FNP as PCP - General (Family Medicine) Shaaron, Lamar HERO, MD as Consulting Physician (Gastroenterology) Billee Mliss JONETTA, [facility] as [facility] Care Management (Pharmacist) [facility]   Chief Complaint:  Cough, Nasal Congestion, Chills, and facial pressure (X 2 days - otc cold meds )   HPI: Sherry Salinas is a 68 y.o. female presenting on 08/17/2024 for Cough, Nasal Congestion, Chills, and facial pressure (X 2 days - otc cold meds )   Sherry Salinas is a 68 year old female with atrial fibrillation who presents with flu-like symptoms.  She has been experiencing flu-like symptoms since Wednesday, including sinus pressure, chest cough, and feeling cold. She has been using Alka-Seltzer Cold Plus, which has provided slight relief for her headache. No use of nasal sprays like Flonase  has been reported.  She has a history of atrial fibrillation and is currently on Cardizem  120 mg once daily and metoprolol  25 mg once daily. She encountered an issue with her pharmacy denying the refill for Cardizem  and is nearly out of the medication. She has a cardiology appointment scheduled for October 16th and was unable to attend an earlier appointment due to short notice.  She has been using a seven-day treatment with Mitosol but feels it has not completely resolved.  She describes feeling anxious and restless, unable to sit through a movie. She is currently taking Buspar  twice daily but feels it is ineffective.          Relevant past medical, surgical, family, and social history reviewed and updated as indicated.  Allergies and medications reviewed and updated. Data reviewed: Chart in Epic.   Past Medical History:  Diagnosis Date   Anxiety    Aortic atherosclerosis (HCC)    Arthritis    Hyperlipidemia    Hypertension    IBS  (irritable bowel syndrome)    Osteopenia 01/08/2021   Prediabetes 01/18/2022   Vitamin D  deficiency 09/14/2017    Past Surgical History:  Procedure Laterality Date   COLONOSCOPY  2015   Dr. Dyane: per pt, diverticulosis, 3 polyps, come back in five years.    COLONOSCOPY WITH ESOPHAGOGASTRODUODENOSCOPY (EGD)  02/2003   Dr. Dyane: Grade 3 esophagitis, hiatal hernia with patulous GE junction, mild duodenitis.  CLOtest, results unavailable.  Colonoscopy normal.   COLONOSCOPY WITH PROPOFOL  N/A 06/23/2021   Procedure: COLONOSCOPY WITH PROPOFOL ;  Surgeon: Cindie Carlin POUR, DO;  [facility]: [facility];  Service: Endoscopy;  Laterality: N/A;  10:30am   POLYPECTOMY  06/23/2021   Procedure: POLYPECTOMY INTESTINAL;  Surgeon: Cindie Carlin POUR, DO;  Location: [facility];  Service: Endoscopy;;   REPLACEMENT TOTAL KNEE Right 2012   ROTATOR CUFF REPAIR Right     Social History   Socioeconomic History   Marital status: Married    Spouse name: Not on file   Number of children: 2   Years of education: Not on file   Highest education level: Associate degree: occupational, Scientist, product/process development, or vocational program  Occupational History   Occupation: Retired    Comment: Previously EMT, FF, and RN  Tobacco Use   Smoking status: Every Day    Current packs/day: 1.00    Average packs/day: 1 pack/day for 30.0 years (30.0 ttl pk-yrs)    Types: Cigarettes   Smokeless tobacco: Never  Vaping Use  Vaping status: Never Used  Substance and Sexual Activity   Alcohol use: Never   Drug use: Never   Sexual activity: Not on file  Other Topics Concern   Not on file  Social History Narrative   Lives with husband, son and granddaughter    Social Drivers of Health   Financial Resource Strain: Medium Risk (06/27/2024)   Overall Financial Resource Strain (CARDIA)    Difficulty of Paying Living Expenses: Somewhat hard  Food Insecurity: No Food Insecurity (06/27/2024)   Hunger Vital Sign    Worried About Running  Out of Food in the Last Year: Never true    Ran Out of Food in the Last Year: Never true  Transportation Needs: No Transportation Needs (06/27/2024)   PRAPARE - Administrator, Civil Service (Medical): No    Lack of Transportation (Non-Medical): No  Physical Activity: Insufficiently Active (06/27/2024)   Exercise Vital Sign    Days of Exercise per Week: 5 days    Minutes of Exercise per Session: 20 min  Stress: Stress Concern Present (06/27/2024)   [employer] of Occupational Health - Occupational Stress Questionnaire    Feeling of Stress: Very much  Social Connections: Moderately Integrated (06/27/2024)   Social Connection and Isolation Panel    Frequency of Communication with Friends and Family: More than three times a week    Frequency of Social Gatherings with Friends and Family: More than three times a week    Attends Religious Services: More than 4 times per year    Active Member of [employer] or Organizations: No    Attends Banker Meetings: Not on file    Marital Status: Married  Catering manager Violence: Not At Risk (02/07/2024)   Humiliation, Afraid, Rape, and Kick questionnaire    Fear of Current or Ex-Partner: No    Emotionally Abused: No    Physically Abused: No    Sexually Abused: No    Outpatient Encounter Medications as of 08/17/2024  Medication Sig   albuterol  (VENTOLIN  HFA) 108 (90 Base) MCG/ACT inhaler Inhale 2 puffs into the lungs every 6 (six) hours as needed for wheezing or shortness of breath.   busPIRone  (BUSPAR ) 15 MG tablet Take 1 tablet (15 mg total) by mouth 3 (three) times daily.   Calcium-Magnesium-Zinc (CAL-MAG-ZINC PO) Take 1 tablet by mouth daily.   cholecalciferol (VITAMIN D3) 25 MCG (1000 UNIT) tablet Take 2,000 Units by mouth daily.   dicyclomine  (BENTYL ) 20 MG tablet TAKE 1 TABLET THREE TIMES DAILY   EPINEPHrine  0.3 mg/0.3 mL IJ SOAJ injection Inject 0.3 mg into the muscle as needed for anaphylaxis.   ezetimibe  (ZETIA ) 10 MG  tablet Take 1 tablet (10 mg total) by mouth daily.   fluconazole  (DIFLUCAN ) 150 MG tablet Take 1 tablet (150 mg total) by mouth once for 1 dose.   fluticasone  (FLONASE ) 50 MCG/ACT nasal spray Place 2 sprays into both nostrils daily.   hydrochlorothiazide  (HYDRODIURIL ) 25 MG tablet Take 1 tablet (25 mg total) by mouth daily.   levocetirizine (XYZAL ) 5 MG tablet Take 1 tablet (5 mg total) by mouth every evening.   methocarbamol  (ROBAXIN ) 500 MG tablet Take 1 tablet (500 mg total) by mouth every 8 (eight) hours as needed for muscle spasms.   metoprolol  succinate (TOPROL -XL) 25 MG 24 hr tablet Take 1 tablet (25 mg total) by mouth daily.   mupirocin  ointment (BACTROBAN ) 2 % APPLY 1 APPLICATION TOPICALLY 2 (TWO) TIMES DAILY.   ondansetron  (ZOFRAN ) 4 MG tablet Take  1 tablet (4 mg total) by mouth every 8 (eight) hours as needed for nausea or vomiting.   pantoprazole  (PROTONIX ) 40 MG tablet Take 1 tablet (40 mg total) by mouth daily.   pravastatin  (PRAVACHOL ) 10 MG tablet Take 1 tablet (10 mg total) by mouth daily.   pyridOXINE (VITAMIN B6) 100 MG tablet Take 200 mg by mouth daily.   traMADol  (ULTRAM ) 50 MG tablet Take 1 tablet (50 mg total) by mouth 2 (two) times daily.   traMADol  (ULTRAM ) 50 MG tablet Take 1 tablet (50 mg total) by mouth 2 (two) times daily.   traMADol  (ULTRAM ) 50 MG tablet Take 1 tablet (50 mg total) by mouth 2 (two) times daily.   [START ON 08/26/2024] traMADol  (ULTRAM ) 50 MG tablet Take 1 tablet (50 mg total) by mouth 2 (two) times daily.   traMADol  (ULTRAM ) 50 MG tablet Take 1 tablet (50 mg total) by mouth 2 (two) times daily.   triamcinolone  cream (KENALOG ) 0.1 % APPLY TOPICALLY TWO TIMES DAILY AS NEEDED   [DISCONTINUED] diltiazem  (CARDIZEM  CD) 120 MG 24 hr capsule Take 1 capsule (120 mg total) by mouth daily.   [DISCONTINUED] metoprolol  succinate (TOPROL -XL) 100 MG 24 hr tablet Take 1 tablet (100 mg total) by mouth daily. (Patient taking differently: Take 25 mg by mouth daily.)    diltiazem  (CARDIZEM  CD) 120 MG 24 hr capsule Take 1 capsule (120 mg total) by mouth daily.   [DISCONTINUED] amoxicillin -clavulanate (AUGMENTIN ) 875-125 MG tablet Take 1 tablet by mouth 2 (two) times daily.   [DISCONTINUED] rivaroxaban  (XARELTO ) 20 MG TABS tablet Take 1 tablet (20 mg total) by mouth daily with supper.   No facility-administered encounter medications on file as of 08/17/2024.    Allergies  Allergen Reactions   Bee Venom Anaphylaxis   Codeine Anaphylaxis   Statins Other (See Comments)    Muscle aches   Anusol-Hc [Hydrocortisone]     Rash/itching, all hemorrhoid creams    Pertinent ROS per HPI, otherwise unremarkable      Objective:  BP (!) 150/89   Pulse 67   Temp 97.7 F (36.5 C)   Ht 5' 3 (1.6 m)   Wt 141 lb (64 kg)   SpO2 97%   BMI 24.98 kg/m    Wt Readings from Last 3 Encounters:  08/17/24 141 lb (64 kg)  07/18/24 140 lb 12.8 oz (63.9 kg)  07/09/24 138 lb (62.6 kg)    Physical Exam Vitals and nursing note reviewed.  Constitutional:      Appearance: Normal appearance.  HENT:     Head: Normocephalic and atraumatic.     Right Ear: A middle ear effusion is present. Tympanic membrane is not erythematous.     Left Ear: A middle ear effusion is present. Tympanic membrane is not erythematous.     Nose: Congestion present. No rhinorrhea.     Mouth/Throat:     Pharynx: Posterior oropharyngeal erythema and postnasal drip present. No pharyngeal swelling, oropharyngeal exudate or uvula swelling.     Comments: Cobblestoning Eyes:     Conjunctiva/sclera: Conjunctivae normal.     Pupils: Pupils are equal, round, and reactive to light.  Cardiovascular:     Rate and Rhythm: Normal rate. Rhythm irregularly irregular.     Pulses: Normal pulses.     Heart sounds: Normal heart sounds.  Pulmonary:     Effort: Pulmonary effort is normal.     Breath sounds: Normal breath sounds.  Musculoskeletal:     Right lower leg: No edema.  Left lower leg: No edema.   Skin:    General: Skin is warm and dry.     Capillary Refill: Capillary refill takes less than 2 seconds.  Neurological:     General: No focal deficit present.     Mental Status: She is alert and oriented to person, place, and time.  Psychiatric:        Mood and Affect: Mood normal.        Behavior: Behavior normal. Behavior is cooperative.        Thought Content: Thought content normal.        Judgment: Judgment normal.      Results for orders placed or performed in visit on 07/18/24  CBC with Differential/Platelet   Collection Time: 07/18/24  3:27 PM  Result Value Ref Range   WBC 10.4 3.4 - 10.8 x10E3/uL   RBC 4.54 3.77 - 5.28 x10E6/uL   Hemoglobin 15.1 11.1 - 15.9 g/dL   Hematocrit 54.0 65.9 - 46.6 %   MCV 101 (H) 79 - 97 fL   MCH 33.3 (H) 26.6 - 33.0 pg   MCHC 32.9 31.5 - 35.7 g/dL   RDW 88.1 88.2 - 84.5 %   Platelets 286 150 - 450 x10E3/uL   Neutrophils 56 Not Estab. %   Lymphs 34 Not Estab. %   Monocytes 8 Not Estab. %   Eos 2 Not Estab. %   Basos 0 Not Estab. %   Neutrophils Absolute 5.8 1.4 - 7.0 x10E3/uL   Lymphocytes Absolute 3.5 (H) 0.7 - 3.1 x10E3/uL   Monocytes Absolute 0.8 0.1 - 0.9 x10E3/uL   EOS (ABSOLUTE) 0.2 0.0 - 0.4 x10E3/uL   Basophils Absolute 0.0 0.0 - 0.2 x10E3/uL   Immature Granulocytes 0 Not Estab. %   Immature Grans (Abs) 0.0 0.0 - 0.1 x10E3/uL  Thyroid  Panel With TSH   Collection Time: 07/18/24  3:27 PM  Result Value Ref Range   TSH 1.820 0.450 - 4.500 uIU/mL   T4, Total 6.4 4.5 - 12.0 ug/dL   T3 Uptake Ratio 25 24 - 39 %   Free Thyroxine Index 1.6 1.2 - 4.9  CMP14+EGFR   Collection Time: 07/18/24  3:27 PM  Result Value Ref Range   Glucose 138 (H) 70 - 99 mg/dL   BUN 16 8 - 27 mg/dL   Creatinine, Ser 8.88 (H) 0.57 - 1.00 mg/dL   eGFR 54 (L) >40 fO/fpw/8.26   BUN/Creatinine Ratio 14 12 - 28   Sodium 140 134 - 144 mmol/L   Potassium 4.2 3.5 - 5.2 mmol/L   Chloride 103 96 - 106 mmol/L   CO2 21 20 - 29 mmol/L   Calcium 9.8 8.7 -  10.3 mg/dL   Total Protein 6.5 6.0 - 8.5 g/dL   Albumin 4.1 3.9 - 4.9 g/dL   Globulin, Total 2.4 1.5 - 4.5 g/dL   Bilirubin Total <9.7 0.0 - 1.2 mg/dL   Alkaline Phosphatase 88 44 - 121 IU/L   AST 20 0 - 40 IU/L   ALT 17 0 - 32 IU/L  Hgb A1c w/o eAG   Collection Time: 07/18/24  3:27 PM  Result Value Ref Range   Hgb A1c MFr Bld 5.9 (H) 4.8 - 5.6 %  Specimen status report   Collection Time: 07/18/24  3:27 PM  Result Value Ref Range   specimen status report Comment        Pertinent labs & imaging results that were available during my care of the patient were reviewed by  me and considered in my medical decision making.  Assessment & Plan:  Sherry Salinas was seen today for cough, nasal congestion, chills and facial pressure.  Diagnoses and all orders for this visit:  URI with cough and congestion -     fluticasone  (FLONASE ) 50 MCG/ACT nasal spray; Place 2 sprays into both nostrils daily.  Chills -     COVID-19, Flu A+B and RSV -     Veritor Flu A/B Waived  New onset a-fib (HCC) -     metoprolol  succinate (TOPROL -XL) 25 MG 24 hr tablet; Take 1 tablet (25 mg total) by mouth daily. -     diltiazem  (CARDIZEM  CD) 120 MG 24 hr capsule; Take 1 capsule (120 mg total) by mouth daily.  Acute vaginitis -     fluconazole  (DIFLUCAN ) 150 MG tablet; Take 1 tablet (150 mg total) by mouth once for 1 dose.      Acute upper respiratory infection Symptoms include sinus pressure, chest cough, and postnasal drainage, consistent with a viral illness prevalent in the community. Differential diagnosis includes COVID-19 and influenza. Flu test was negative; COVID-19 and RSV results pending. - Recommend Coricidin HBP for symptom management, safe for use with hypertension and atrial fibrillation. - Prescribe Flonase  for nasal congestion and Eustachian tube dysfunction.  Atrial fibrillation and hypertension Currently managed with Cardizem  (diltiazem ) 120 mg daily and metoprolol  25 mg daily. Previous  prescription issues with pharmacy denying refill requests. She prefers original regimen due to better symptom control. - Send prescription for Cardizem  120 mg daily and metoprolol  25 mg daily to pharmacy. - Instruct her to inquire with pharmacy regarding prescription denial and request pharmacy to contact the office if issues persist. - Follow-up with cardiology appointment scheduled for October 16.  Anxiety disorder Reports increased anxiety and restlessness, described as feeling like a 'buzzsaw'. Currently taking Buspar , but feels it is not effective. She is on tramadol , limiting additional anxiolytic options without psychiatric consultation. - Increase Buspar  dosage to three times daily. - Discuss potential need for psychiatric referral if symptoms persist.  Candidal vulvovaginitis Symptoms not fully resolved with Mitosol 7-day treatment. - Prescribe Diflucan  for yeast infection.          Continue all other maintenance medications.  Follow up plan: Return if symptoms worsen or fail to improve.   Continue healthy lifestyle choices, including diet (rich in fruits, vegetables, and lean proteins, and low in salt and simple carbohydrates) and exercise (at least 30 minutes of moderate physical activity daily).  Educational handout given for URI  The above assessment and management plan was discussed with the patient. The patient verbalized understanding of and has agreed to the management plan. Patient is aware to call the clinic if they develop any new symptoms or if symptoms persist or worsen. Patient is aware when to return to the clinic for a follow-up visit. Patient educated on when it is appropriate to go to the emergency department.   Sherry Bruns, FNP-C [facility] [phone]

## 2024-08-18 ENCOUNTER — Ambulatory Visit: Payer: Self-pay | Admitting: Family Medicine

## 2024-08-18 LAB — COVID-19, FLU A+B AND RSV
Influenza A, NAA: NOT DETECTED
Influenza B, NAA: NOT DETECTED
RSV, NAA: NOT DETECTED
SARS-CoV-2, NAA: NOT DETECTED

## 2024-08-28 ENCOUNTER — Ambulatory Visit (INDEPENDENT_AMBULATORY_CARE_PROVIDER_SITE_OTHER): Admitting: Family Medicine

## 2024-08-28 ENCOUNTER — Encounter: Payer: Self-pay | Admitting: Family Medicine

## 2024-08-28 VITALS — BP 154/82 | HR 90 | Temp 97.4°F | Ht 63.0 in | Wt 138.8 lb

## 2024-08-28 DIAGNOSIS — R062 Wheezing: Secondary | ICD-10-CM

## 2024-08-28 DIAGNOSIS — J069 Acute upper respiratory infection, unspecified: Secondary | ICD-10-CM | POA: Diagnosis not present

## 2024-08-28 DIAGNOSIS — I4891 Unspecified atrial fibrillation: Secondary | ICD-10-CM | POA: Diagnosis not present

## 2024-08-28 MED ORDER — PREDNISONE 20 MG PO TABS: 20.0000 mg | ORAL_TABLET | Freq: Every day | ORAL | 0 refills | Status: AC

## 2024-08-28 MED ORDER — DOXYCYCLINE HYCLATE 100 MG PO TABS: 100.0000 mg | ORAL_TABLET | Freq: Two times a day (BID) | ORAL | 0 refills | Status: AC

## 2024-08-28 MED ORDER — BENZONATATE 200 MG PO CAPS: 200.0000 mg | ORAL_CAPSULE | Freq: Two times a day (BID) | ORAL | 0 refills | Status: DC | PRN

## 2024-08-28 NOTE — Progress Notes (Signed)
 Subjective:  Patient ID: Sherry Salinas, female    DOB: 12-30-1955, 68 y.o.   MRN: [id number]  Patient Care Team: Severa Rock HERO, FNP as PCP - General (Family Medicine) Shaaron Lamar HERO, MD as Consulting Physician (Gastroenterology) Billee Mliss JONETTA, [facility] as [facility] Care Management (Pharmacist) [facility]   Chief Complaint:  Cough, Nasal Congestion, Headache, and Ear Pain (Left. Patient was seen 9/12 )   HPI: Sherry Salinas is a 68 y.o. female presenting on 08/28/2024 for Cough, Nasal Congestion, Headache, and Ear Pain (Left. Patient was seen 9/12 )   Sherry Salinas is a 68 year old female who presents with persistent respiratory symptoms and fatigue.  She experiences ongoing symptoms of congestion, wheezing, and a persistent cough. Additionally, she has drainage and an earache, which still causes discomfort.  Recently, she developed a headache that started yesterday and persists today.  She uses her albuterol  inhaler, which helps with the wheezing, especially at night to aid sleep. Despite this, she experiences fatigue.  No fever is present, but she experiences cold sweats since the onset of her symptoms, necessitating frequent changes of clothing due to sweating.  She was previously prescribed Flonase  and an over-the-counter decongestant, which initially provided relief but are no longer effective.          Relevant past medical, surgical, family, and social history reviewed and updated as indicated.  Allergies and medications reviewed and updated. Data reviewed: Chart in Epic.   Past Medical History:  Diagnosis Date   Anxiety    Aortic atherosclerosis    Arthritis    Hyperlipidemia    Hypertension    IBS (irritable bowel syndrome)    Osteopenia 01/08/2021   Prediabetes 01/18/2022   Vitamin D  deficiency 09/14/2017    Past Surgical History:  Procedure Laterality Date   COLONOSCOPY  2015   Dr. Dyane: per pt,  diverticulosis, 3 polyps, come back in five years.    COLONOSCOPY WITH ESOPHAGOGASTRODUODENOSCOPY (EGD)  02/2003   Dr. Dyane: Grade 3 esophagitis, hiatal hernia with patulous GE junction, mild duodenitis.  CLOtest, results unavailable.  Colonoscopy normal.   COLONOSCOPY WITH PROPOFOL  N/A 06/23/2021   Procedure: COLONOSCOPY WITH PROPOFOL ;  Surgeon: Cindie Carlin POUR, DO;  [facility];  Service: Endoscopy;  Laterality: N/A;  10:30am   POLYPECTOMY  06/23/2021   Procedure: POLYPECTOMY INTESTINAL;  Surgeon: Cindie Carlin POUR, DO;  Location: [facility];  Service: Endoscopy;;   REPLACEMENT TOTAL KNEE Right 2012   ROTATOR CUFF REPAIR Right     Social History   Socioeconomic History   Marital status: Married    Spouse name: Not on file   Number of children: 2   Years of education: Not on file   Highest education level: Associate degree: occupational, Scientist, product/process development, or vocational program  Occupational History   Occupation: Retired    Comment: Previously EMT, FF, and RN  Tobacco Use   Smoking status: Every Day    Current packs/day: 1.00    Average packs/day: 1 pack/day for 30.0 years (30.0 ttl pk-yrs)    Types: Cigarettes   Smokeless tobacco: Never  Vaping Use   Vaping status: Never Used  Substance and Sexual Activity   Alcohol use: Never   Drug use: Never   Sexual activity: Not on file  Other Topics Concern   Not on file  Social History Narrative   Lives with husband, son and granddaughter    Social Drivers  of Health   Financial Resource Strain: Medium Risk (06/27/2024)   Overall Financial Resource Strain (CARDIA)    Difficulty of Paying Living Expenses: Somewhat hard  Food Insecurity: No Food Insecurity (06/27/2024)   Hunger Vital Sign    Worried About Running Out of Food in the Last Year: Never true    Ran Out of Food in the Last Year: Never true  Transportation Needs: No Transportation Needs (06/27/2024)   PRAPARE - Administrator, Civil Service (Medical):  No    Lack of Transportation (Non-Medical): No  Physical Activity: Insufficiently Active (06/27/2024)   Exercise Vital Sign    Days of Exercise per Week: 5 days    Minutes of Exercise per Session: 20 min  Stress: Stress Concern Present (06/27/2024)   [employer] of Occupational Health - Occupational Stress Questionnaire    Feeling of Stress: Very much  Social Connections: Moderately Integrated (06/27/2024)   Social Connection and Isolation Panel    Frequency of Communication with Friends and Family: More than three times a week    Frequency of Social Gatherings with Friends and Family: More than three times a week    Attends Religious Services: More than 4 times per year    Active Member of [employer] or Organizations: No    Attends Banker Meetings: Not on file    Marital Status: Married  Catering manager Violence: Not At Risk (02/07/2024)   Humiliation, Afraid, Rape, and Kick questionnaire    Fear of Current or Ex-Partner: No    Emotionally Abused: No    Physically Abused: No    Sexually Abused: No    Outpatient Encounter Medications as of 08/28/2024  Medication Sig   albuterol  (VENTOLIN  HFA) 108 (90 Base) MCG/ACT inhaler Inhale 2 puffs into the lungs every 6 (six) hours as needed for wheezing or shortness of breath.   benzonatate  (TESSALON ) 200 MG capsule Take 1 capsule (200 mg total) by mouth 2 (two) times daily as needed for cough.   Calcium-Magnesium-Zinc (CAL-MAG-ZINC PO) Take 1 tablet by mouth daily.   cholecalciferol (VITAMIN D3) 25 MCG (1000 UNIT) tablet Take 2,000 Units by mouth daily.   dicyclomine  (BENTYL ) 20 MG tablet TAKE 1 TABLET THREE TIMES DAILY   diltiazem  (CARDIZEM  CD) 120 MG 24 hr capsule Take 1 capsule (120 mg total) by mouth daily.   doxycycline  (VIBRA -TABS) 100 MG tablet Take 1 tablet (100 mg total) by mouth 2 (two) times daily for 10 days. 1 po bid   EPINEPHrine  0.3 mg/0.3 mL IJ SOAJ injection Inject 0.3 mg into the muscle as needed for anaphylaxis.    ezetimibe  (ZETIA ) 10 MG tablet Take 1 tablet (10 mg total) by mouth daily.   fluticasone  (FLONASE ) 50 MCG/ACT nasal spray Place 2 sprays into both nostrils daily.   hydrochlorothiazide  (HYDRODIURIL ) 25 MG tablet Take 1 tablet (25 mg total) by mouth daily.   levocetirizine (XYZAL ) 5 MG tablet Take 1 tablet (5 mg total) by mouth every evening.   methocarbamol  (ROBAXIN ) 500 MG tablet Take 1 tablet (500 mg total) by mouth every 8 (eight) hours as needed for muscle spasms.   metoprolol  succinate (TOPROL -XL) 25 MG 24 hr tablet Take 1 tablet (25 mg total) by mouth daily.   mupirocin  ointment (BACTROBAN ) 2 % APPLY 1 APPLICATION TOPICALLY 2 (TWO) TIMES DAILY.   ondansetron  (ZOFRAN ) 4 MG tablet Take 1 tablet (4 mg total) by mouth every 8 (eight) hours as needed for nausea or vomiting.   pantoprazole  (PROTONIX ) 40 MG  tablet Take 1 tablet (40 mg total) by mouth daily.   pravastatin  (PRAVACHOL ) 10 MG tablet Take 1 tablet (10 mg total) by mouth daily.   predniSONE  (DELTASONE ) 20 MG tablet Take 1 tablet (20 mg total) by mouth daily with breakfast for 5 days.   pyridOXINE (VITAMIN B6) 100 MG tablet Take 200 mg by mouth daily.   traMADol  (ULTRAM ) 50 MG tablet Take 1 tablet (50 mg total) by mouth 2 (two) times daily.   traMADol  (ULTRAM ) 50 MG tablet Take 1 tablet (50 mg total) by mouth 2 (two) times daily.   traMADol  (ULTRAM ) 50 MG tablet Take 1 tablet (50 mg total) by mouth 2 (two) times daily.   traMADol  (ULTRAM ) 50 MG tablet Take 1 tablet (50 mg total) by mouth 2 (two) times daily.   traMADol  (ULTRAM ) 50 MG tablet Take 1 tablet (50 mg total) by mouth 2 (two) times daily.   triamcinolone  cream (KENALOG ) 0.1 % APPLY TOPICALLY TWO TIMES DAILY AS NEEDED   No facility-administered encounter medications on file as of 08/28/2024.    Allergies  Allergen Reactions   Bee Venom Anaphylaxis   Codeine Anaphylaxis   Statins Other (See Comments)    Muscle aches   Anusol-Hc [Hydrocortisone]     Rash/itching, all  hemorrhoid creams    Pertinent ROS per HPI, otherwise unremarkable      Objective:  BP (!) 154/82   Pulse 90   Temp (!) 97.4 F (36.3 C)   Ht 5' 3 (1.6 m)   Wt 138 lb 12.8 oz (63 kg)   SpO2 97%   BMI 24.59 kg/m    Wt Readings from Last 3 Encounters:  08/28/24 138 lb 12.8 oz (63 kg)  08/17/24 141 lb (64 kg)  07/18/24 140 lb 12.8 oz (63.9 kg)    Physical Exam Vitals and nursing note reviewed.  Constitutional:      Appearance: Normal appearance. She is normal weight.  HENT:     Head: Normocephalic and atraumatic.     Right Ear: Ear canal and external ear normal. A middle ear effusion is present. Tympanic membrane is not erythematous.     Left Ear: Ear canal and external ear normal. A middle ear effusion is present. Tympanic membrane is not erythematous.     Nose: Congestion present.     Mouth/Throat:     Lips: Pink.     Mouth: Mucous membranes are moist.     Pharynx: Oropharynx is clear. Postnasal drip present. No oropharyngeal exudate or posterior oropharyngeal erythema.  Eyes:     Conjunctiva/sclera: Conjunctivae normal.     Pupils: Pupils are equal, round, and reactive to light.  Cardiovascular:     Rate and Rhythm: Normal rate. Rhythm irregularly irregular.  Pulmonary:     Effort: Pulmonary effort is normal. No respiratory distress.     Breath sounds: No stridor. Wheezing present. No rhonchi or rales.     Comments: Dry cough Chest:     Chest wall: No tenderness.  Musculoskeletal:     Cervical back: Neck supple.     Right lower leg: No edema.     Left lower leg: No edema.  Skin:    General: Skin is warm and dry.     Capillary Refill: Capillary refill takes less than 2 seconds.  Neurological:     General: No focal deficit present.     Mental Status: She is alert and oriented to person, place, and time.  Psychiatric:  Mood and Affect: Mood normal.        Behavior: Behavior normal.        Thought Content: Thought content normal.        Judgment:  Judgment normal.     Results for orders placed or performed in visit on 08/17/24  Veritor Flu A/B Waived   Collection Time: 08/17/24  3:14 PM  Result Value Ref Range   Influenza A Negative Negative   Influenza B Negative Negative  COVID-19, Flu A+B and RSV   Collection Time: 08/17/24  3:18 PM   Specimen: Nasal Swab  Result Value Ref Range   SARS-CoV-2, NAA Not Detected Not Detected   Influenza A, NAA Not Detected Not Detected   Influenza B, NAA Not Detected Not Detected   RSV, NAA Not Detected Not Detected   Test Information: Comment        Pertinent labs & imaging results that were available during my care of the patient were reviewed by me and considered in my medical decision making.  Assessment & Plan:  Trust was seen today for cough, nasal congestion, headache and ear pain.  Diagnoses and all orders for this visit:  URI with cough and congestion -     predniSONE  (DELTASONE ) 20 MG tablet; Take 1 tablet (20 mg total) by mouth daily with breakfast for 5 days. -     doxycycline  (VIBRA -TABS) 100 MG tablet; Take 1 tablet (100 mg total) by mouth 2 (two) times daily for 10 days. 1 po bid -     benzonatate  (TESSALON ) 200 MG capsule; Take 1 capsule (200 mg total) by mouth 2 (two) times daily as needed for cough.  Wheezing -     predniSONE  (DELTASONE ) 20 MG tablet; Take 1 tablet (20 mg total) by mouth daily with breakfast for 5 days. -     doxycycline  (VIBRA -TABS) 100 MG tablet; Take 1 tablet (100 mg total) by mouth 2 (two) times daily for 10 days. 1 po bid  New onset a-fib [facility]) Follow up with cardiology as scheduled.       URI with persistent cough and wheezing Persistent symptoms of congestion, wheezing, and cough since August 17, 2024, with earache, headache, and cold sweats. Wheezing present but no bronchitis on auscultation. Albuterol  inhaler provides relief. Significant fatigue impacting daily activities. Previous treatment with Flonase  and OTC decongestants provided  initial relief but symptoms returned. - Prescribe doxycycline  as pt was on Augmentin  last month - Prescribe oral prednisone  to reduce inflammation and cough - Advise monitoring heart rate due to potential increase from prednisone  - Prescribe Tessalon  for cough management  A-Fib Heart rate well-controlled with diltiazem  and metoprolol . Experiences increased heart rate at night affecting sleep. - Advise switching metoprolol  to nighttime and diltiazem  to daytime to improve rate control - Monitor heart rate and blood pressure, especially with prednisone  use - Follow up with cardiology on September 20, 2024          Continue all other maintenance medications.  Follow up plan: Return if symptoms worsen or fail to improve.   Continue healthy lifestyle choices, including diet (rich in fruits, vegetables, and lean proteins, and low in salt and simple carbohydrates) and exercise (at least 30 minutes of moderate physical activity daily).  Educational handout given for A-Fib  The above assessment and management plan was discussed with the patient. The patient verbalized understanding of and has agreed to the management plan. Patient is aware to call the clinic if they develop any new symptoms or  if symptoms persist or worsen. Patient is aware when to return to the clinic for a follow-up visit. Patient educated on when it is appropriate to go to the emergency department.   Rosaline Bruns, FNP-C [facility] [phone]

## 2024-09-14 ENCOUNTER — Other Ambulatory Visit: Payer: Self-pay | Admitting: Family Medicine

## 2024-09-14 DIAGNOSIS — K589 Irritable bowel syndrome without diarrhea: Secondary | ICD-10-CM

## 2024-09-17 NOTE — Telephone Encounter (Signed)
 Last OV 08/28/24. Last RF 07/06/24. Next OV 10/18/24

## 2024-09-20 ENCOUNTER — Encounter: Payer: Self-pay | Admitting: Cardiology

## 2024-09-20 ENCOUNTER — Ambulatory Visit: Attending: Cardiology | Admitting: Cardiology

## 2024-09-20 VITALS — BP 150/98 | HR 73 | Ht 63.0 in | Wt 142.0 lb

## 2024-09-20 DIAGNOSIS — I4891 Unspecified atrial fibrillation: Secondary | ICD-10-CM | POA: Diagnosis not present

## 2024-09-20 DIAGNOSIS — D6869 Other thrombophilia: Secondary | ICD-10-CM

## 2024-09-20 MED ORDER — RIVAROXABAN 20 MG PO TABS: 20.0000 mg | ORAL_TABLET | Freq: Every day | ORAL | 6 refills | Status: AC

## 2024-09-20 MED ORDER — RIVAROXABAN 20 MG PO TABS: 20.0000 mg | ORAL_TABLET | Freq: Every day | ORAL | 0 refills | Status: DC

## 2024-09-20 NOTE — Progress Notes (Signed)
 Clinical Summary Sherry Salinas is a 68 y.o.female seen today as a new consult, referred by NP Rakes for the following medical problems.   1.PAF -  From notes looks to have been newly diagnosed by pcp in office 06/27/24. Review of EKG in epic confirms afib - she was started on eliquis  at tha time, diltiazem  added to her metoprolol  she has been on already for palpitations -07/2024 monitor by pcp: SR, 10 runs SVT longest 17 beats. 17% afib burden, rates ranging from 50 to 171 and average 108.   - reports side effects on higher doses of toprol  but not sure of exact side effects.  - has been taking diltiazem  120mg  just prn as opposed to daily. Continues on her toprol  25mg  daily. g  - has palpitations about 2-3 times per week, can last hours.  - on eliquis  reports nausea, headache. Symptoms resolved off eliquis    2. HTN - compliant with meds   Past Medical History:  Diagnosis Date   Anxiety    Aortic atherosclerosis    Arthritis    Hyperlipidemia    Hypertension    IBS (irritable bowel syndrome)    Osteopenia 01/08/2021   Prediabetes 01/18/2022   Vitamin D  deficiency 09/14/2017     Allergies  Allergen Reactions   Bee Venom Anaphylaxis   Codeine Anaphylaxis   Statins Other (See Comments)    Muscle aches   Anusol-Hc [Hydrocortisone]     Rash/itching, all hemorrhoid creams     Current Outpatient Medications  Medication Sig Dispense Refill   albuterol  (VENTOLIN  HFA) 108 (90 Base) MCG/ACT inhaler Inhale 2 puffs into the lungs every 6 (six) hours as needed for wheezing or shortness of breath. 8 g 0   benzonatate  (TESSALON ) 200 MG capsule Take 1 capsule (200 mg total) by mouth 2 (two) times daily as needed for cough. 20 capsule 0   Calcium-Magnesium-Zinc (CAL-MAG-ZINC PO) Take 1 tablet by mouth daily.     cholecalciferol (VITAMIN D3) 25 MCG (1000 UNIT) tablet Take 2,000 Units by mouth daily.     dicyclomine  (BENTYL ) 20 MG tablet TAKE 1 TABLET THREE TIMES DAILY 270 tablet 3    diltiazem  (CARDIZEM  CD) 120 MG 24 hr capsule Take 1 capsule (120 mg total) by mouth daily. 90 capsule 0   EPINEPHrine  0.3 mg/0.3 mL IJ SOAJ injection Inject 0.3 mg into the muscle as needed for anaphylaxis. 1 each 3   ezetimibe  (ZETIA ) 10 MG tablet Take 1 tablet (10 mg total) by mouth daily. 90 tablet 1   fluticasone  (FLONASE ) 50 MCG/ACT nasal spray Place 2 sprays into both nostrils daily. 16 g 6   hydrochlorothiazide  (HYDRODIURIL ) 25 MG tablet Take 1 tablet (25 mg total) by mouth daily. 90 tablet 1   levocetirizine (XYZAL ) 5 MG tablet Take 1 tablet (5 mg total) by mouth every evening. 90 tablet 3   methocarbamol  (ROBAXIN ) 500 MG tablet Take 1 tablet (500 mg total) by mouth every 8 (eight) hours as needed for muscle spasms. 60 tablet 5   metoprolol  succinate (TOPROL -XL) 25 MG 24 hr tablet Take 1 tablet (25 mg total) by mouth daily. 90 tablet 3   mupirocin  ointment (BACTROBAN ) 2 % APPLY 1 APPLICATION TOPICALLY 2 (TWO) TIMES DAILY. 22 g 11   ondansetron  (ZOFRAN ) 4 MG tablet Take 1 tablet (4 mg total) by mouth every 8 (eight) hours as needed for nausea or vomiting. 20 tablet 1   pantoprazole  (PROTONIX ) 40 MG tablet Take 1 tablet (40 mg total)  by mouth daily. 90 tablet 1   pravastatin  (PRAVACHOL ) 10 MG tablet Take 1 tablet (10 mg total) by mouth daily. 90 tablet 1   pyridOXINE (VITAMIN B6) 100 MG tablet Take 200 mg by mouth daily.     traMADol  (ULTRAM ) 50 MG tablet Take 1 tablet (50 mg total) by mouth 2 (two) times daily. 60 tablet 0   traMADol  (ULTRAM ) 50 MG tablet Take 1 tablet (50 mg total) by mouth 2 (two) times daily. 60 tablet 0   traMADol  (ULTRAM ) 50 MG tablet Take 1 tablet (50 mg total) by mouth 2 (two) times daily. 60 tablet 0   traMADol  (ULTRAM ) 50 MG tablet Take 1 tablet (50 mg total) by mouth 2 (two) times daily. 60 tablet 0   traMADol  (ULTRAM ) 50 MG tablet Take 1 tablet (50 mg total) by mouth 2 (two) times daily. 60 tablet 0   triamcinolone  cream (KENALOG ) 0.1 % APPLY TOPICALLY TWO TIMES  DAILY AS NEEDED 45 g 0   No current facility-administered medications for this visit.     Past Surgical History:  Procedure Laterality Date   COLONOSCOPY  2015   Dr. Dyane: per pt, diverticulosis, 3 polyps, come back in five years.    COLONOSCOPY WITH ESOPHAGOGASTRODUODENOSCOPY (EGD)  02/2003   Dr. Dyane: Grade 3 esophagitis, hiatal hernia with patulous GE junction, mild duodenitis.  CLOtest, results unavailable.  Colonoscopy normal.   COLONOSCOPY WITH PROPOFOL  N/A 06/23/2021   Procedure: COLONOSCOPY WITH PROPOFOL ;  Surgeon: Cindie Carlin POUR, DO;  [facility];  Service: Endoscopy;  Laterality: N/A;  10:30am   POLYPECTOMY  06/23/2021   Procedure: POLYPECTOMY INTESTINAL;  Surgeon: Cindie Carlin POUR, DO;  [facility];  Service: Endoscopy;;   REPLACEMENT TOTAL KNEE Right 2012   ROTATOR CUFF REPAIR Right      Allergies  Allergen Reactions   Bee Venom Anaphylaxis   Codeine Anaphylaxis   Statins Other (See Comments)    Muscle aches   Anusol-Hc [Hydrocortisone]     Rash/itching, all hemorrhoid creams      Family History  Problem Relation Age of Onset   Arthritis Mother    Kidney failure Mother    Rheum arthritis Mother    Stroke Father    Heart disease Father    Heart attack Father    Stroke Maternal Grandfather    Early death Paternal Grandmother        Childbirth   Early death Paternal Grandfather        MVA   Colon cancer Paternal Uncle    Other Brother        Guillain Barr syndrome     Social History Sherry Salinas reports that she has been smoking cigarettes. She has a 30 pack-year smoking history. She has never used smokeless tobacco. Sherry Salinas reports no history of alcohol use.    Physical Examination Today's Vitals   09/20/24 1024  BP: (!) 150/98  Pulse: 73  SpO2: 97%  Weight: 142 lb (64.4 kg)  Height: 5' 3 (1.6 m)   Body mass index is 25.15 kg/m.  Gen: resting comfortably, no acute distress HEENT: no scleral icterus, pupils  equal round and reactive, no palptable cervical adenopathy,  CV: irreg, tachy Resp: Clear to auscultation bilaterally GI: abdomen is soft, non-tender, non-distended, normal bowel sounds, no hepatosplenomegaly MSK: extremities are warm, no edema.  Skin: warm, no rash Neuro:  no focal deficits Psych: appropriate affect     Assessment and Plan  1.PAF/acquired thrombophilia - new  diagnosis by pcp in 06/2024 made in clinic. Outpatient monitor showed 17% afib burden - she is on toprol  25mg  and diltiazem  120mg  daily but only taking the dilt prn. She thinks she had some prior side effects to higher dosing of toprol . We disucssed stopping toprol  and trying to control afib with higher doses of diltiazem  but she favors being on both medications. Thus will continue toprol  25mg  but start taking the diltiazem  120mg  daily - reported side effects on eliquis , will try xarelto  20mg  daily. CHADS2Vasc score is 3 ( HTN, age, gender) - obtain echo - EKG today shows afib 107  2. HTN - elevated here but follow with taking her diltiazem  daily.       Dorn PHEBE Ross, M.D.

## 2024-09-20 NOTE — Patient Instructions (Addendum)
 Medication Instructions:   Stop Eliquis   Begin Xarelto  20mg  every evening with supper Continue all other medications.     Labwork:  none  Testing/Procedures:  Your physician has requested that you have an echocardiogram. Echocardiography is a painless test that uses sound waves to create images of your heart. It provides your doctor with information about the size and shape of your heart and how well your heart's chambers and valves are working. This procedure takes approximately one hour. There are no restrictions for this procedure. Please do NOT wear cologne, perfume, aftershave, or lotions (deodorant is allowed). Please arrive 15 minutes prior to your appointment time.  Please note: We ask at that you not bring children with you during ultrasound (echo/ vascular) testing. Due to room size and safety concerns, children are not allowed in the ultrasound rooms during exams. Our front office staff cannot provide observation of children in our lobby area while testing is being conducted. An adult accompanying a patient to their appointment will only be allowed in the ultrasound room at the discretion of the ultrasound technician under special circumstances. We apologize for any inconvenience. Office will contact with results via phone, letter or mychart.     Follow-Up:  4 weeks    Any Other Special Instructions Will Be Listed Below (If Applicable).   If you need a refill on your cardiac medications before your next appointment, please call your pharmacy.

## 2024-09-24 ENCOUNTER — Ambulatory Visit: Payer: Self-pay

## 2024-09-24 NOTE — Telephone Encounter (Signed)
 Noted, will be addressed at visit.

## 2024-09-24 NOTE — Telephone Encounter (Signed)
 FYI Only or Action Required?: Action required by provider: request for appointment.  Patient was last seen in primary care on 08/28/2024 by Severa Rock HERO, FNP.  Called Nurse Triage reporting No chief complaint on file..  Symptoms began several days ago.  Interventions attempted: Nothing.  Symptoms are: gradually worsening.  Triage Disposition: See PCP When Office is Open (Within 3 Days)  Patient/caregiver understands and will follow disposition?: Yes   Copied from CRM #[id number]. Topic: Clinical - Red Word Triage >> Sep 24, 2024  2:04 PM Susanna ORN wrote: Red Word that prompted transfer to Nurse Triage: Patient called to schedule appt with Dr. Severa. States she's needing to be checked for hemorrhoids which Dr. Severa has been treating for over a year now. Patient states she can't get stool to pass colon so she's needing to be checked. No fever or bleeding but states she's having a lot of abdomen discomfort. Reason for Disposition  [1] Home treatment > 3 days for rectal pain AND [2] not improved  Answer Assessment - Initial Assessment Questions 1. SYMPTOM:  What's the main symptom you're concerned about? (e.g., pain, itching, swelling, rash)     Tenderness  2. ONSET: When did the constipation  start?     Several days ago  3. RECTAL PAIN: Do you have any pain around your rectum? How bad is the pain?  (Scale 0-10; or none, mild, moderate, severe)     Denies  4. RECTAL ITCHING: Do you have any itching in this area? How bad is the itching?  (Scale 0-10; or none, mild, moderate, severe)     No  5. CONSTIPATION: Do you have constipation? If Yes, ask: How often do you have a bowel movement (BM)?  (Normal range: 3 times a day to every 3 days)  When was your last BM?       Yes  6. CAUSE: What do you think is causing the anus symptoms?     Hemorrhoids  7. OTHER SYMPTOMS: Do you have any other symptoms?  (e.g., abdomen pain, fever, rectal bleeding, vomiting)      Abdominal Pain  8. PREGNANCY: Is there any chance you are pregnant? When was your last menstrual period?     No and No  Protocols used: Rectal Symptoms-A-AH

## 2024-09-26 ENCOUNTER — Ambulatory Visit (INDEPENDENT_AMBULATORY_CARE_PROVIDER_SITE_OTHER)

## 2024-09-26 ENCOUNTER — Encounter: Payer: Self-pay | Admitting: Family Medicine

## 2024-09-26 ENCOUNTER — Ambulatory Visit (INDEPENDENT_AMBULATORY_CARE_PROVIDER_SITE_OTHER): Admitting: Family Medicine

## 2024-09-26 VITALS — BP 158/84 | HR 101 | Temp 97.0°F | Ht 63.0 in | Wt 142.2 lb

## 2024-09-26 DIAGNOSIS — K5900 Constipation, unspecified: Secondary | ICD-10-CM

## 2024-09-26 DIAGNOSIS — K6289 Other specified diseases of anus and rectum: Secondary | ICD-10-CM

## 2024-09-26 DIAGNOSIS — R109 Unspecified abdominal pain: Secondary | ICD-10-CM | POA: Diagnosis not present

## 2024-09-26 DIAGNOSIS — K644 Residual hemorrhoidal skin tags: Secondary | ICD-10-CM

## 2024-09-26 DIAGNOSIS — N2889 Other specified disorders of kidney and ureter: Secondary | ICD-10-CM | POA: Diagnosis not present

## 2024-09-26 MED ORDER — LIDOCAINE HCL URETHRAL/MUCOSAL 2 % EX GEL: 1.0000 | CUTANEOUS | 0 refills | Status: DC | PRN

## 2024-09-26 MED ORDER — LACTULOSE 20 GM/30ML PO SOLN: 20.0000 g | Freq: Once | ORAL | 0 refills | Status: AC

## 2024-09-26 NOTE — Progress Notes (Signed)
 Subjective:  Patient ID: Barnie JONETTA Haddock, female    DOB: August 23, 1956, 68 y.o.   MRN: [id number]  Patient Care Team: Severa Rock HERO, FNP as PCP - General (Family Medicine) Alvan Dorn FALCON, MD as PCP - Cardiology (Cardiology) Shaaron Lamar HERO, MD as Consulting Physician (Gastroenterology) Billee Mliss JONETTA, [facility] as [facility] Care Management (Pharmacist) [facility]   Chief Complaint:  Hemorrhoids (Using witch hazel ) and Constipation   HPI: IVOREE FELMLEE is a 68 y.o. female presenting on 09/26/2024 for Hemorrhoids (Using witch hazel ) and Constipation  KARLE DESROSIER is a 68 year old female with irritable bowel syndrome who presents with constipation and hemorrhoids.  She has been experiencing constipation for about a week, characterized by difficulty passing stool. The stool is described as 'big clumps, solid,' but after taking laxatives, it becomes 'explosive' and watery. She has tried fiber tablets, hot prune juice, and Exlax, but these have not provided consistent relief. She experiences cramping and feels as though the stool is passing around a blockage, resulting in small, squished pieces of stool. She is currently taking Bentyl  daily for irritable bowel syndrome but has not been using Miralax recently for constipation.  She has a history of hemorrhoids, both internal and external, as noted during her last colonoscopy. Currently, she is experiencing significant discomfort from a hemorrhoid, which causes pain when sitting and contributes to nausea when pressure is applied to her abdomen. She has been using witch hazel for relief, as she is allergic to other treatments, which cause swelling and rashes.  No bleeding from hemorrhoids. Reports nausea associated with abdominal pressure.          Relevant past medical, surgical, family, and social history reviewed and updated as indicated.  Allergies and medications reviewed and updated.  Data reviewed: Chart in Epic.   Past Medical History:  Diagnosis Date   Anxiety    Aortic atherosclerosis    Arthritis    Hyperlipidemia    Hypertension    IBS (irritable bowel syndrome)    Osteopenia 01/08/2021   Prediabetes 01/18/2022   Vitamin D  deficiency 09/14/2017    Past Surgical History:  Procedure Laterality Date   COLONOSCOPY  2015   Dr. Dyane: per pt, diverticulosis, 3 polyps, come back in five years.    COLONOSCOPY WITH ESOPHAGOGASTRODUODENOSCOPY (EGD)  02/2003   Dr. Dyane: Grade 3 esophagitis, hiatal hernia with patulous GE junction, mild duodenitis.  CLOtest, results unavailable.  Colonoscopy normal.   COLONOSCOPY WITH PROPOFOL  N/A 06/23/2021   Procedure: COLONOSCOPY WITH PROPOFOL ;  Surgeon: Cindie Carlin POUR, DO;  [facility];  Service: Endoscopy;  Laterality: N/A;  10:30am   POLYPECTOMY  06/23/2021   Procedure: POLYPECTOMY INTESTINAL;  Surgeon: Cindie Carlin POUR, DO;  Location: [facility];  Service: Endoscopy;;   REPLACEMENT TOTAL KNEE Right 2012   ROTATOR CUFF REPAIR Right     Social History   Socioeconomic History   Marital status: Married    Spouse name: Not on file   Number of children: 2   Years of education: Not on file   Highest education level: Associate degree: occupational, Scientist, product/process development, or vocational program  Occupational History   Occupation: Retired    Comment: Previously EMT, FF, and RN  Tobacco Use   Smoking status: Every Day    Current packs/day: 1.00    Average packs/day: 1 pack/day for 30.0 years (30.0 ttl pk-yrs)    Types: Cigarettes  Smokeless tobacco: Never  Vaping Use   Vaping status: Never Used  Substance and Sexual Activity   Alcohol use: Never   Drug use: Never   Sexual activity: Not on file  Other Topics Concern   Not on file  Social History Narrative   Lives with husband, son and granddaughter    Social Drivers of Health   Financial Resource Strain: Medium Risk (06/27/2024)   Overall Financial Resource  Strain (CARDIA)    Difficulty of Paying Living Expenses: Somewhat hard  Food Insecurity: No Food Insecurity (06/27/2024)   Hunger Vital Sign    Worried About Running Out of Food in the Last Year: Never true    Ran Out of Food in the Last Year: Never true  Transportation Needs: No Transportation Needs (06/27/2024)   PRAPARE - Administrator, Civil Service (Medical): No    Lack of Transportation (Non-Medical): No  Physical Activity: Insufficiently Active (06/27/2024)   Exercise Vital Sign    Days of Exercise per Week: 5 days    Minutes of Exercise per Session: 20 min  Stress: Stress Concern Present (06/27/2024)   [employer] of Occupational Health - Occupational Stress Questionnaire    Feeling of Stress: Very much  Social Connections: Moderately Integrated (06/27/2024)   Social Connection and Isolation Panel    Frequency of Communication with Friends and Family: More than three times a week    Frequency of Social Gatherings with Friends and Family: More than three times a week    Attends Religious Services: More than 4 times per year    Active Member of [employer] or Organizations: No    Attends Banker Meetings: Not on file    Marital Status: Married  Catering manager Violence: Not At Risk (02/07/2024)   Humiliation, Afraid, Rape, and Kick questionnaire    Fear of Current or Ex-Partner: No    Emotionally Abused: No    Physically Abused: No    Sexually Abused: No    Outpatient Encounter Medications as of 09/26/2024  Medication Sig   albuterol  (VENTOLIN  HFA) 108 (90 Base) MCG/ACT inhaler Inhale 2 puffs into the lungs every 6 (six) hours as needed for wheezing or shortness of breath.   Calcium-Magnesium-Zinc (CAL-MAG-ZINC PO) Take 1 tablet by mouth daily.   cholecalciferol (VITAMIN D3) 25 MCG (1000 UNIT) tablet Take 2,000 Units by mouth daily.   dicyclomine  (BENTYL ) 20 MG tablet TAKE 1 TABLET THREE TIMES DAILY   diltiazem  (CARDIZEM  CD) 120 MG 24 hr capsule Take 1  capsule (120 mg total) by mouth daily.   EPINEPHrine  0.3 mg/0.3 mL IJ SOAJ injection Inject 0.3 mg into the muscle as needed for anaphylaxis.   ezetimibe  (ZETIA ) 10 MG tablet Take 1 tablet (10 mg total) by mouth daily.   fluticasone  (FLONASE ) 50 MCG/ACT nasal spray Place 2 sprays into both nostrils daily.   hydrochlorothiazide  (HYDRODIURIL ) 25 MG tablet Take 1 tablet (25 mg total) by mouth daily.   Lactulose 20 GM/30ML SOLN Take 30 mLs (20 g total) by mouth once for 1 dose.   levocetirizine (XYZAL ) 5 MG tablet Take 1 tablet (5 mg total) by mouth every evening.   lidocaine (XYLOCAINE) 2 % jelly Apply 1 Application topically as needed (hemorrhoid pain).   methocarbamol  (ROBAXIN ) 500 MG tablet Take 1 tablet (500 mg total) by mouth every 8 (eight) hours as needed for muscle spasms.   metoprolol  succinate (TOPROL -XL) 25 MG 24 hr tablet Take 1 tablet (25 mg total) by mouth daily.  mupirocin  ointment (BACTROBAN ) 2 % APPLY 1 APPLICATION TOPICALLY 2 (TWO) TIMES DAILY.   ondansetron  (ZOFRAN ) 4 MG tablet Take 1 tablet (4 mg total) by mouth every 8 (eight) hours as needed for nausea or vomiting.   pantoprazole  (PROTONIX ) 40 MG tablet Take 1 tablet (40 mg total) by mouth daily.   pravastatin  (PRAVACHOL ) 10 MG tablet Take 1 tablet (10 mg total) by mouth daily.   pyridOXINE (VITAMIN B6) 100 MG tablet Take 200 mg by mouth daily.   rivaroxaban  (XARELTO ) 20 MG TABS tablet Take 1 tablet (20 mg total) by mouth daily with supper.   traMADol  (ULTRAM ) 50 MG tablet Take 1 tablet (50 mg total) by mouth 2 (two) times daily.   triamcinolone  cream (KENALOG ) 0.1 % APPLY TOPICALLY TWO TIMES DAILY AS NEEDED   [DISCONTINUED] benzonatate  (TESSALON ) 200 MG capsule Take 1 capsule (200 mg total) by mouth 2 (two) times daily as needed for cough.   No facility-administered encounter medications on file as of 09/26/2024.    Allergies  Allergen Reactions   Bee Venom Anaphylaxis   Codeine Anaphylaxis   Statins Other (See  Comments)    Muscle aches   Anusol-Hc [Hydrocortisone]     Rash/itching, all hemorrhoid creams    Pertinent ROS per HPI, otherwise unremarkable      Objective:  BP (!) 158/84   Pulse (!) 101   Temp (!) 97 F (36.1 C)   Ht 5' 3 (1.6 m)   Wt 142 lb 3.2 oz (64.5 kg)   SpO2 98%   BMI 25.19 kg/m    Wt Readings from Last 3 Encounters:  09/26/24 142 lb 3.2 oz (64.5 kg)  09/20/24 142 lb (64.4 kg)  08/28/24 138 lb 12.8 oz (63 kg)    Physical Exam Exam conducted with a chaperone present.  Constitutional:      General: She is in acute distress.     Appearance: Normal appearance. She is normal weight. She is not ill-appearing, toxic-appearing or diaphoretic.  HENT:     Head: Normocephalic and atraumatic.  Eyes:     Pupils: Pupils are equal, round, and reactive to light.  Cardiovascular:     Rate and Rhythm: Normal rate. Rhythm irregularly irregular.  Pulmonary:     Effort: Pulmonary effort is normal.     Breath sounds: Normal breath sounds.  Abdominal:     Palpations: Abdomen is soft.  Genitourinary:    Rectum: Mass and external hemorrhoid present. No tenderness.     Comments: Inflamed external hemorrhoid vs rectal mass. Tender to touch, erythematous and firm. Not thrombosed.  Musculoskeletal:     Right lower leg: No edema.     Left lower leg: No edema.  Skin:    General: Skin is warm and dry.     Capillary Refill: Capillary refill takes less than 2 seconds.  Neurological:     General: No focal deficit present.     Mental Status: She is alert and oriented to person, place, and time.  Psychiatric:        Mood and Affect: Mood normal.        Behavior: Behavior normal.        Thought Content: Thought content normal.        Judgment: Judgment normal.    X-Ray: KUB: significant stool burden in left colon. Preliminary x-ray reading by Rosaline Bruns, FNP-C, [facility].    Results for orders placed or performed in visit on 08/17/24  [facility] Flu A/B [facility]  Time: 08/17/24  3:14 PM  Result Value Ref Range   Influenza A Negative Negative   Influenza B Negative Negative  COVID-19, Flu A+B and RSV   Collection Time: 08/17/24  3:18 PM   Specimen: Nasal Swab  Result Value Ref Range   SARS-CoV-2, NAA Not Detected Not Detected   Influenza A, NAA Not Detected Not Detected   Influenza B, NAA Not Detected Not Detected   RSV, NAA Not Detected Not Detected   Test Information: Comment        Pertinent labs & imaging results that were available during my care of the patient were reviewed by me and considered in my medical decision making.  Assessment & Plan:  Lavergne was seen today for hemorrhoids and constipation.  Diagnoses and all orders for this visit:  Constipation in female -     DG Abd 1 View -     Lactulose 20 GM/30ML SOLN; Take 30 mLs (20 g total) by mouth once for 1 dose. -     Ambulatory referral to Gastroenterology  Inflamed external hemorrhoid -     lidocaine (XYLOCAINE) 2 % jelly; Apply 1 Application topically as needed (hemorrhoid pain). -     Ambulatory referral to Gastroenterology  Rectal pain -     lidocaine (XYLOCAINE) 2 % jelly; Apply 1 Application topically as needed (hemorrhoid pain). -     Ambulatory referral to Gastroenterology     Constipation with associated external hemorrhoid and nausea in the setting of irritable bowel syndrome Chronic constipation with recent exacerbation over the past week, leading to significant discomfort and nausea. Associated with a large, painful external hemorrhoid, not thrombosed. Ineffective relief from fiber supplements, prune juice, and Exlax. Concern for possible stool obstruction. - Order KUB to assess for stool obstruction - Prescribe Miralax daily with increased water  intake - Prescribe topical lidocaine gel for hemorrhoid pain relief - Contact GI specialist to expedite appointment          Continue all other maintenance medications.  Follow up plan: Return if symptoms  worsen or fail to improve.   Continue healthy lifestyle choices, including diet (rich in fruits, vegetables, and lean proteins, and low in salt and simple carbohydrates) and exercise (at least 30 minutes of moderate physical activity daily).  Educational handout given for hemorrhoids   The above assessment and management plan was discussed with the patient. The patient verbalized understanding of and has agreed to the management plan. Patient is aware to call the clinic if they develop any new symptoms or if symptoms persist or worsen. Patient is aware when to return to the clinic for a follow-up visit. Patient educated on when it is appropriate to go to the emergency department.   Rosaline Bruns, FNP-C [facility] [phone]

## 2024-09-27 ENCOUNTER — Encounter: Payer: Self-pay | Admitting: Family Medicine

## 2024-09-30 ENCOUNTER — Other Ambulatory Visit: Payer: Self-pay | Admitting: Family Medicine

## 2024-09-30 DIAGNOSIS — H60542 Acute eczematoid otitis externa, left ear: Secondary | ICD-10-CM

## 2024-09-30 DIAGNOSIS — F411 Generalized anxiety disorder: Secondary | ICD-10-CM

## 2024-10-02 ENCOUNTER — Ambulatory Visit: Payer: Self-pay | Admitting: Family Medicine

## 2024-10-02 ENCOUNTER — Other Ambulatory Visit: Payer: Self-pay | Admitting: Family Medicine

## 2024-10-02 DIAGNOSIS — G8929 Other chronic pain: Secondary | ICD-10-CM

## 2024-10-03 ENCOUNTER — Ambulatory Visit

## 2024-10-18 ENCOUNTER — Ambulatory Visit: Payer: Self-pay | Admitting: Family Medicine

## 2024-10-22 ENCOUNTER — Ambulatory Visit

## 2024-10-24 ENCOUNTER — Ambulatory Visit: Admitting: Gastroenterology

## 2024-10-26 ENCOUNTER — Ambulatory Visit: Payer: Self-pay

## 2024-10-26 NOTE — Telephone Encounter (Signed)
 FYI Only or Action Required?: FYI only for provider: appointment scheduled on 10/29/24.  Patient was last seen in primary care on 09/26/2024 by Severa Rock HERO, FNP.  Called Nurse Triage reporting Abdominal Pain.  Symptoms began several months ago.  Interventions attempted: Nothing.  Symptoms are: stable.  Triage Disposition: See PCP Within 2 Weeks  Patient/caregiver understands and will follow disposition?: Yes Reason for Disposition  Abdominal pain is a chronic symptom (recurrent or ongoing AND present > 4 weeks)  Answer Assessment - Initial Assessment Questions Patient states last time had a BM was this morning, straining, small amount and a lot of mucous. Patient states been evaluated for this before and is having a flare up.   1. LOCATION: Where does it hurt?      Left lower quadrant   2. RADIATION: Does the pain shoot anywhere else? (e.g., chest, back)     Denies  3. ONSET: When did the pain begin? (e.g., minutes, hours or days ago)      Worsening for a while  4. SUDDEN: Gradual or sudden onset?     Gradual  5. PATTERN Does the pain come and go, or is it constant?     Constant  6. SEVERITY: How bad is the pain?  (e.g., Scale 1-10; mild, moderate, or severe)     4/10  7. RECURRENT SYMPTOM: Have you ever had this type of stomach pain before? If Yes, ask: When was the last time? and What happened that time?      Constipation is, and has diverticulitis  8. RELIEVING/AGGRAVATING FACTORS: What makes it better or worse? (e.g., antacids, bending or twisting motion, bowel movement)     Bentyl  20 mg 3x daily  9. OTHER SYMPTOMS: Do you have any other symptoms? (e.g., back pain, diarrhea, fever, urination pain, vomiting)       Hemorrhoids  Protocols used: Abdominal Pain - [facility]  Copied from CRM #[id number]. Topic: Clinical - Red Word Triage >> Oct 26, 2024  4:23 PM Antwanette L wrote: Red Word that prompted transfer to Nurse Triage: diverticulitis  issues. Pt having changes in bowels, pain and burning in her abdomen

## 2024-10-29 ENCOUNTER — Ambulatory Visit: Admitting: Family Medicine

## 2024-10-29 ENCOUNTER — Encounter: Payer: Self-pay | Admitting: Family Medicine

## 2024-10-29 DIAGNOSIS — K644 Residual hemorrhoidal skin tags: Secondary | ICD-10-CM

## 2024-10-29 DIAGNOSIS — K6289 Other specified diseases of anus and rectum: Secondary | ICD-10-CM | POA: Diagnosis not present

## 2024-10-29 MED ORDER — LIDOCAINE HCL URETHRAL/MUCOSAL 2 % EX GEL: 1.0000 | CUTANEOUS | 0 refills | Status: DC | PRN

## 2024-10-29 MED ORDER — LINACLOTIDE 145 MCG PO CAPS: 145.0000 ug | ORAL_CAPSULE | Freq: Every day | ORAL | 5 refills | Status: AC

## 2024-10-29 MED ORDER — HYDROCORTISONE (PERIANAL) 2.5 % EX CREA: 1.0000 | TOPICAL_CREAM | Freq: Two times a day (BID) | CUTANEOUS | 0 refills | Status: AC

## 2024-10-29 NOTE — Progress Notes (Signed)
 Subjective:  Patient ID: Barnie JONETTA Haddock, female    DOB: February 01, 1956  Age: 68 y.o. MRN: [id number]  CC: Constipation (Saw michelle a month ago for this. Placed on miralax but it makes her bloated. Can pt take x lax tabs and they are working. Seems to have to take something to have s movement. Only small amounts. /Hx of diverticulitis and currently has a hemorrhoid.)   HPI  Discussed the use of AI scribe software for clinical note transcription with the patient, who gave verbal consent to proceed.  History of Present Illness KARRI KALLENBACH is a 68 year old female with atrial fibrillation who presents with constipation.  She has been experiencing constipation since the end of September, describing a sensation of being 'totally blocked, full of stool.' She has been using over-the-counter laxatives like Exlax up to three times a week. Previously, she tried Miralax daily but discontinued it due to bloating. Despite having a prescription laxative from a prior visit, she continues to struggle with constipation. She had to cancel a gastroenterology appointment due to a family emergency and has rescheduled it for January.  She has a history of atrial fibrillation, diagnosed after experiencing palpitations. She is not currently on a blood thinner due to intolerance. Her current medications include Cardizem  (diltiazem ) for rate control. She occasionally uses ondansetron  for nausea and has been on tramadol  for years without constipation issues.  She mentions a history of hemorrhoids, which have been painful and may be contributing to her constipation. She is allergic to Preparation H and has been using witch hazel for relief. Lidocaine  gel was prescribed but not filled by the pharmacy. She has both internal and external hemorrhoids, which cause significant discomfort during bowel movements.          10/29/2024    9:21 AM 09/26/2024    9:47 AM 07/18/2024    3:01 PM  Depression screen PHQ 2/9   Decreased Interest 1 3 2   Down, Depressed, Hopeless 1 1 1   PHQ - 2 Score 2 4 3   Altered sleeping 3 1 3   Tired, decreased energy 1 2 2   Change in appetite 0 0 1  Feeling bad or failure about yourself  1 0 1  Trouble concentrating 1 1 1   Moving slowly or fidgety/restless 0 0 0  Suicidal thoughts 0 0 0  PHQ-9 Score 8 8  11    Difficult doing work/chores Somewhat difficult Somewhat difficult Somewhat difficult     Data saved with a previous flowsheet row definition    History Briante has a past medical history of Anxiety, Aortic atherosclerosis, Arthritis, Hyperlipidemia, Hypertension, IBS (irritable bowel syndrome), Osteopenia (01/08/2021), Prediabetes (01/18/2022), and Vitamin D  deficiency (09/14/2017).   She has a past surgical history that includes Replacement total knee (Right, 2012); Rotator cuff repair (Right); Colonoscopy with esophagogastroduodenoscopy (egd) (02/2003); Colonoscopy (2015); Colonoscopy with propofol  (N/A, 06/23/2021); and Polypectomy (06/23/2021).   Her family history includes Arthritis in her mother; Colon cancer in her paternal uncle; Early death in her paternal grandfather and paternal grandmother; Heart attack in her father; Heart disease in her father; Kidney failure in her mother; Other in her brother; Rheum arthritis in her mother; Stroke in her father and maternal grandfather.She reports that she has been smoking cigarettes. She has a 30 pack-year smoking history. She has never used smokeless tobacco. She reports that she does not drink alcohol and does not use drugs.    ROS Review of Systems  Constitutional: Negative.   HENT: Negative.  Eyes:  Negative for visual disturbance.  Respiratory:  Negative for shortness of breath.   Cardiovascular:  Negative for chest pain.  Gastrointestinal:  Positive for constipation and rectal pain. Negative for abdominal pain and diarrhea.  Musculoskeletal:  Negative for arthralgias.    Objective:  BP 138/75   Pulse 71    Temp 97.9 F (36.6 C)   Ht 5' 3 (1.6 m)   Wt 140 lb (63.5 kg)   SpO2 98%   BMI 24.80 kg/m   BP Readings from Last 3 Encounters:  10/29/24 138/75  09/26/24 (!) 158/84  09/20/24 (!) 150/98    Wt Readings from Last 3 Encounters:  10/29/24 140 lb (63.5 kg)  09/26/24 142 lb 3.2 oz (64.5 kg)  09/20/24 142 lb (64.4 kg)     Physical Exam Physical Exam GENERAL: Alert, cooperative, well developed, no acute distress HEENT: Normocephalic, normal oropharynx, moist mucous membranes CHEST: Clear to auscultation bilaterally, No wheezes, rhonchi, or crackles CARDIOVASCULAR: Normal heart rate and rhythm, S1 and S2 normal without murmurs ABDOMEN: Soft, non-tender, non-distended, without organomegaly, Normal bowel sounds EXTREMITIES: No cyanosis or edema NEUROLOGICAL: Cranial nerves grossly intact, Moves all extremities without gross motor or sensory deficit   Assessment & Plan:  Inflamed external hemorrhoid -     Lidocaine  HCl Urethral/Mucosal; Apply 1 Application topically as needed (hemorrhoid pain).  Dispense: 50 mL; Refill: 0  Rectal pain -     Lidocaine  HCl Urethral/Mucosal; Apply 1 Application topically as needed (hemorrhoid pain).  Dispense: 50 mL; Refill: 0  Other orders -     Hydrocortisone  (Perianal); Place 1 Application rectally 2 (two) times daily.  Dispense: 30 g; Refill: 0 -     linaCLOtide ; Take 1 capsule (145 mcg total) by mouth daily. To regulate bowel movements  Dispense: 30 capsule; Refill: 5    Assessment and Plan Assessment & Plan Constipation   Chronic constipation has persisted since late September, necessitating regular Exlax use. Miralax was discontinued due to bloating. Diltiazem  is not implicated in the constipation. Hemorrhoids may contribute to bowel movement difficulty due to pain. Start Miralax twice daily, increasing to four times daily if needed. Linzess  is prescribed once daily regardless of bowel movement status. Use hemorrhoid cream as needed for  pain relief.  Hemorrhoids with residual skin tags   Painful hemorrhoids are causing difficulty with bowel movements. She is allergic to Preparation H. Lidocaine  gel was prescribed but not filled, and witch hazel is used for swelling. Internal hemorrhoids may contribute to constipation. A prescription for lidocaine  gel was sent to the pharmacy. Hydrocortisone  cream is prescribed for hemorrhoid management. Continue using witch hazel for swelling relief.  Atrial fibrillation   She is diagnosed with atrial fibrillation and is not on anticoagulation due to personal preference and cardiologist's concern. There is a high risk of stroke associated with atrial fibrillation. Natural remedies like garlic and cinnamon are ineffective for stroke prevention. Samples of Xarelto  were provided to assess tolerance. The importance of anticoagulation to prevent stroke was discussed.  Primary hypertension   Continue the current antihypertensive regimen.       Follow-up: Return if symptoms worsen or fail to improve.  Butler Der, M.D.

## 2024-10-30 ENCOUNTER — Other Ambulatory Visit: Payer: Self-pay | Admitting: Family Medicine

## 2024-10-30 ENCOUNTER — Ambulatory Visit: Payer: Self-pay

## 2024-10-30 MED ORDER — CIPROFLOXACIN HCL 500 MG PO TABS: 500.0000 mg | ORAL_TABLET | Freq: Two times a day (BID) | ORAL | 0 refills | Status: DC

## 2024-10-30 MED ORDER — METRONIDAZOLE 500 MG PO TABS: 500.0000 mg | ORAL_TABLET | Freq: Two times a day (BID) | ORAL | 0 refills | Status: DC

## 2024-10-30 NOTE — Telephone Encounter (Signed)
 Please let the patient know that I sent their prescription to their pharmacy. Thanks, WS

## 2024-10-30 NOTE — Telephone Encounter (Signed)
 FYI Only or Action Required?: Action required by provider: update on patient condition.  Patient was last seen in primary care on 10/29/2024 by Zollie Lowers, MD.  Called Nurse Triage reporting Abdominal Pain.  Symptoms began yesterday.  Interventions attempted: Nothing.  Symptoms are: gradually worsening. Pt. Seen 10/29/24 and forgot to ask for antibiotic for her diverticulitis. Pain 5/10, low grade fever. Please advise pt.  Triage Disposition: See Physician Within 24 Hours  Patient/caregiver understands and will follow disposition?: Yes     Copied from CRM #[id number]. Topic: Clinical - Red Word Triage >> Oct 30, 2024 12:49 PM Wess RAMAN wrote: Red Word that prompted transfer to Nurse Triage: Patient would like Dr. Zollie to send her in an antibiotic for Diverticulitis. She forgot to mention it to him yesterday that she would like medication  Symptoms: Pain in colon, spasm, lots of discomfort, burning  Pharmacy:  [facility] [phone] - [place], [place] - [address] [place] [address] Phone: [phone] Fax: [phone] Hours: Not open 24 hours Reason for Disposition  [1] MODERATE pain (e.g., interferes with normal activities) AND [2] pain comes and goes (cramps) AND [3] present > 24 hours  (Exception: Pain with Vomiting or Diarrhea - see that Guideline.)  Answer Assessment - Initial Assessment Questions 1. LOCATION: Where does it hurt?      Left lower quadrant 2. RADIATION: Does the pain shoot anywhere else? (e.g., chest, back)     no 3. ONSET: When did the pain begin? (e.g., minutes, hours or days ago)      weekend 4. SUDDEN: Gradual or sudden onset?     sudden 5. PATTERN Does the pain come and go, or is it constant?     Constant 6. SEVERITY: How bad is the pain?  (e.g., Scale 1-10; mild, moderate, or severe)     5 7. RECURRENT SYMPTOM: Have you ever had this type of stomach pain before? If Yes, ask: When was the last  time? and What happened that time?      yes 8. CAUSE: What do you think is causing the stomach pain? (e.g., gallstones, recent abdominal surgery)     diverticulitis 9. RELIEVING/AGGRAVATING FACTORS: What makes it better or worse? (e.g., antacids, bending or twisting motion, bowel movement)     no 10. OTHER SYMPTOMS: Do you have any other symptoms? (e.g., back pain, diarrhea, fever, urination pain, vomiting)       Low grade fever 11. PREGNANCY: Is there any chance you are pregnant? When was your last menstrual period?       no  Protocols used: Abdominal Pain - [facility] [place]

## 2024-10-30 NOTE — Telephone Encounter (Signed)
 Pt declined to make an appt said she was just here and did mention this to the nurse yesterday but forgot to tell the provider Pt would like to speak to a nurse. Please advise

## 2024-10-31 NOTE — Telephone Encounter (Signed)
 Notified patient via mychart. Patient is active on mychart

## 2024-11-06 ENCOUNTER — Ambulatory Visit

## 2024-11-08 ENCOUNTER — Ambulatory Visit: Admitting: Cardiology

## 2024-11-11 ENCOUNTER — Other Ambulatory Visit: Payer: Self-pay | Admitting: *Deleted

## 2024-11-11 DIAGNOSIS — I4891 Unspecified atrial fibrillation: Secondary | ICD-10-CM

## 2024-11-21 ENCOUNTER — Other Ambulatory Visit: Payer: Self-pay | Admitting: Family Medicine

## 2024-11-21 DIAGNOSIS — K219 Gastro-esophageal reflux disease without esophagitis: Secondary | ICD-10-CM

## 2024-11-29 ENCOUNTER — Other Ambulatory Visit: Payer: Self-pay | Admitting: Family Medicine

## 2024-11-29 DIAGNOSIS — I10 Essential (primary) hypertension: Secondary | ICD-10-CM

## 2024-11-30 ENCOUNTER — Ambulatory Visit: Payer: Self-pay | Admitting: Family Medicine

## 2024-11-30 ENCOUNTER — Ambulatory Visit: Payer: Medicare HMO | Admitting: Family Medicine

## 2024-11-30 ENCOUNTER — Encounter: Payer: Self-pay | Admitting: Family Medicine

## 2024-11-30 VITALS — BP 136/86 | HR 76 | Temp 97.4°F | Ht 63.0 in | Wt 141.4 lb

## 2024-11-30 DIAGNOSIS — M25551 Pain in right hip: Secondary | ICD-10-CM

## 2024-11-30 DIAGNOSIS — J301 Allergic rhinitis due to pollen: Secondary | ICD-10-CM

## 2024-11-30 DIAGNOSIS — G8929 Other chronic pain: Secondary | ICD-10-CM

## 2024-11-30 DIAGNOSIS — I4819 Other persistent atrial fibrillation: Secondary | ICD-10-CM

## 2024-11-30 DIAGNOSIS — K644 Residual hemorrhoidal skin tags: Secondary | ICD-10-CM | POA: Diagnosis not present

## 2024-11-30 DIAGNOSIS — E782 Mixed hyperlipidemia: Secondary | ICD-10-CM

## 2024-11-30 DIAGNOSIS — M25512 Pain in left shoulder: Secondary | ICD-10-CM | POA: Diagnosis not present

## 2024-11-30 DIAGNOSIS — I700 Atherosclerosis of aorta: Secondary | ICD-10-CM

## 2024-11-30 DIAGNOSIS — Z0001 Encounter for general adult medical examination with abnormal findings: Secondary | ICD-10-CM

## 2024-11-30 DIAGNOSIS — I10 Essential (primary) hypertension: Secondary | ICD-10-CM | POA: Diagnosis not present

## 2024-11-30 DIAGNOSIS — Z91030 Bee allergy status: Secondary | ICD-10-CM

## 2024-11-30 DIAGNOSIS — F411 Generalized anxiety disorder: Secondary | ICD-10-CM

## 2024-11-30 DIAGNOSIS — D72820 Lymphocytosis (symptomatic): Secondary | ICD-10-CM

## 2024-11-30 DIAGNOSIS — M25561 Pain in right knee: Secondary | ICD-10-CM | POA: Diagnosis not present

## 2024-11-30 DIAGNOSIS — Z0000 Encounter for general adult medical examination without abnormal findings: Secondary | ICD-10-CM

## 2024-11-30 LAB — BAYER DCA HB A1C WAIVED: HB A1C (BAYER DCA - WAIVED): 5.6 % (ref 4.8–5.6)

## 2024-11-30 MED ORDER — TRAMADOL HCL 50 MG PO TABS: 50.0000 mg | ORAL_TABLET | Freq: Two times a day (BID) | ORAL | 0 refills | Status: AC

## 2024-11-30 MED ORDER — EPINEPHRINE 0.3 MG/0.3ML IJ SOAJ: 0.3000 mg | INTRAMUSCULAR | 3 refills | Status: AC | PRN

## 2024-11-30 MED ORDER — LEVOCETIRIZINE DIHYDROCHLORIDE 5 MG PO TABS: 5.0000 mg | ORAL_TABLET | Freq: Every evening | ORAL | 3 refills | Status: DC

## 2024-11-30 MED ORDER — BUSPIRONE HCL 15 MG PO TABS: 15.0000 mg | ORAL_TABLET | Freq: Three times a day (TID) | ORAL | 1 refills | Status: AC

## 2024-11-30 MED ORDER — EZETIMIBE 10 MG PO TABS: 10.0000 mg | ORAL_TABLET | Freq: Every day | ORAL | 1 refills | Status: AC

## 2024-11-30 MED ORDER — HYDROCHLOROTHIAZIDE 25 MG PO TABS: 25.0000 mg | ORAL_TABLET | Freq: Every day | ORAL | 1 refills | Status: AC

## 2024-11-30 NOTE — Progress Notes (Signed)
 "  Complete physical exam  Patient: Sherry Salinas   DOB: 21-Feb-1956   68 y.o. Female  MRN: [id number]  Subjective:    Chief Complaint  Patient presents with   Annual Exam    Sherry Salinas is a 68 y.o. female who presents today for a complete physical exam. She reports consuming a general diet. The patient has a physically strenuous job, but has no regular exercise apart from work.  She generally feels well. She reports sleeping well. She does have additional problems to discuss today.   Sherry Salinas is a 68 year old female who presents with ongoing issues related to hemorrhoids and constipation.  She experiences significant discomfort from hemorrhoids and is currently using witch hazel for relief, which she applies frequently to numb the area. No bleeding is associated with the hemorrhoids.  She experiences daily constipation and is using MiraLax powder, taking one capful daily. She finds the recommended three times a day dosage too harsh. She sometimes takes tramadol  for pain relief, particularly when her hemorrhoids are painful, which may contribute to her constipation. She takes half a tablet in the afternoon and another half at bedtime, occasionally needing more.  Her heart rate has improved, allowing her to be more active. She is currently taking diltiazem  for her heart condition and sometimes requires an additional dose. She is also taking Zetia  for cholesterol management, as she does not tolerate pravastatin .  She declined a mammogram and lung cancer screening. A bone density test could not be performed due to unavailability of x-ray services. Colonoscopy is not due until 2027.      Most recent fall risk assessment:    11/30/2024   10:06 AM  Fall Risk   Falls in the past year? 0  Risk for fall due to : No Fall Risks  Follow up Falls evaluation completed     Most recent depression screenings:    11/30/2024   10:06 AM 10/29/2024    9:21 AM  PHQ 2/9 Scores  PHQ - 2  Score 2 2  PHQ- 9 Score 6 8    Vision:Within last year and Dental: No current dental problems  Patient Active Problem List   Diagnosis Date Noted   Persistent atrial fibrillation (HCC) 07/03/2024   Vitamin B12 deficiency 11/22/2023   Hematuria, microscopic 03/08/2023   Chronic right hip pain 07/13/2022   Bee sting allergy 07/13/2022   Aortic atherosclerosis 01/17/2022   Fatty liver 04/07/2021   Tobacco use 04/06/2021   Osteopenia 01/08/2021   Controlled substance agreement signed 07/07/2020   Hx of adenomatous colonic polyps 01/18/2020   Chronic left shoulder pain 01/27/2018   Non-seasonal allergic rhinitis 01/27/2018   GAD (generalized anxiety disorder) 09/14/2017   Vitamin D  deficiency 09/14/2017   Xanthelasma 01/19/2017   Chronic pain of right knee 11/04/2016   Irritable bowel syndrome without diarrhea 11/04/2016   Hyperlipidemia 11/23/2011   Primary hypertension 09/27/2011   GERD (gastroesophageal reflux disease) 09/27/2011   Past Medical History:  Diagnosis Date   Anxiety    Aortic atherosclerosis    Arthritis    Hyperlipidemia    Hypertension    IBS (irritable bowel syndrome)    Osteopenia 01/08/2021   Prediabetes 01/18/2022   Vitamin D  deficiency 09/14/2017   Past Surgical History:  Procedure Laterality Date   COLONOSCOPY  2015   Dr. Dyane: per pt, diverticulosis, 3 polyps, come back in five years.    COLONOSCOPY WITH ESOPHAGOGASTRODUODENOSCOPY (EGD)  02/2003   Dr. Dyane:  Grade 3 esophagitis, hiatal hernia with patulous GE junction, mild duodenitis.  CLOtest, results unavailable.  Colonoscopy normal.   COLONOSCOPY WITH PROPOFOL  N/A 06/23/2021   Procedure: COLONOSCOPY WITH PROPOFOL ;  Surgeon: Cindie Carlin POUR, DO;  Location: [facility];  Service: Endoscopy;  Laterality: N/A;  10:30am   POLYPECTOMY  06/23/2021   Procedure: POLYPECTOMY INTESTINAL;  Surgeon: Cindie Carlin POUR, DO;  Location: [facility];  Service: Endoscopy;;   REPLACEMENT TOTAL KNEE Right  2012   ROTATOR CUFF REPAIR Right    Social History[1] Social History   Socioeconomic History   Marital status: Married    Spouse name: Not on file   Number of children: 2   Years of education: Not on file   Highest education level: Associate degree: occupational, scientist, product/process development, or vocational program  Occupational History   Occupation: Retired    Comment: Previously EMT, FF, and RN  Tobacco Use   Smoking status: Every Day    Current packs/day: 1.00    Average packs/day: 1 pack/day for 30.0 years (30.0 ttl pk-yrs)    Types: Cigarettes   Smokeless tobacco: Never  Vaping Use   Vaping status: Never Used  Substance and Sexual Activity   Alcohol use: Never   Drug use: Never   Sexual activity: Not on file  Other Topics Concern   Not on file  Social History Narrative   Lives with husband, son and granddaughter    Social Drivers of Health   Tobacco Use: High Risk (11/30/2024)   Patient History    Smoking Tobacco Use: Every Day    Smokeless Tobacco Use: Never    Passive Exposure: Not on file  Financial Resource Strain: Medium Risk (06/27/2024)   Overall Financial Resource Strain (CARDIA)    Difficulty of Paying Living Expenses: Somewhat hard  Food Insecurity: No Food Insecurity (06/27/2024)   Epic    Worried About Radiation Protection Practitioner of Food in the Last Year: Never true    Ran Out of Food in the Last Year: Never true  Transportation Needs: No Transportation Needs (06/27/2024)   Epic    Lack of Transportation (Medical): No    Lack of Transportation (Non-Medical): No  Physical Activity: Insufficiently Active (06/27/2024)   Exercise Vital Sign    Days of Exercise per Week: 5 days    Minutes of Exercise per Session: 20 min  Stress: Stress Concern Present (06/27/2024)   [employer] of Occupational Health - Occupational Stress Questionnaire    Feeling of Stress: Very much  Social Connections: Moderately Integrated (06/27/2024)   Social Connection and Isolation Panel    Frequency of  Communication with Friends and Family: More than three times a week    Frequency of Social Gatherings with Friends and Family: More than three times a week    Attends Religious Services: More than 4 times per year    Active Member of [employer] or Organizations: No    Attends Banker Meetings: Not on file    Marital Status: Married  Intimate Partner Violence: Not At Risk (02/07/2024)   Humiliation, Afraid, Rape, and Kick questionnaire    Fear of Current or Ex-Partner: No    Emotionally Abused: No    Physically Abused: No    Sexually Abused: No  Depression (PHQ2-9): Medium Risk (11/30/2024)   Depression (PHQ2-9)    PHQ-2 Score: 6  Alcohol Screen: Low Risk (02/07/2024)   Alcohol Screen    Last Alcohol Screening Score (AUDIT): 0  Housing: Unknown (06/27/2024)   Epic  Unable to Pay for Housing in the Last Year: No    Number of Times Moved in the Last Year: Not on file    Homeless in the Last Year: No  Utilities: Not At Risk (02/07/2024)   AHC Utilities    Threatened with loss of utilities: No  Health Literacy: Adequate Health Literacy (02/07/2024)   B1300 Health Literacy    Frequency of need for help with medical instructions: Never   Family Status  Relation Name Status   Mother Vada Deceased at age 63   Father India Deceased   MGM  Deceased   MGF Bennet Deceased   PGM Schuyler Deceased   PGF Noah Deceased   Son  Alive   Son  Alive   Nutritional Therapist  (Not Specified)   Brother  Alive  No partnership data on file   Family History  Problem Relation Age of Onset   Arthritis Mother    Kidney failure Mother    Rheum arthritis Mother    Stroke Father    Heart disease Father    Heart attack Father    Stroke Maternal Grandfather    Early death Paternal Grandmother        Childbirth   Early death Paternal Grandfather        MVA   Colon cancer Paternal Uncle    Other Brother        Scientific Laboratory Technician syndrome   Allergies[2]    Patient Care Team: Robby Pirani, Rock HERO, FNP as PCP - General  (Family Medicine) Alvan, Dorn FALCON, MD as PCP - Cardiology (Cardiology) Shaaron, Lamar HERO, MD as Consulting Physician (Gastroenterology) Billee Mliss BIRCH, RPH-CPP as [facility] Care Management (Pharmacist) [facility]   Show/hide medication list[3]  ROS per HPI      Objective:     BP 136/86   Pulse 76   Temp (!) 97.4 F (36.3 C)   Ht 5' 3 (1.6 m)   Wt 141 lb 6.4 oz (64.1 kg)   SpO2 99%   BMI 25.05 kg/m  BP Readings from Last 3 Encounters:  11/30/24 136/86  10/29/24 138/75  09/26/24 (!) 158/84   Wt Readings from Last 3 Encounters:  11/30/24 141 lb 6.4 oz (64.1 kg)  10/29/24 140 lb (63.5 kg)  09/26/24 142 lb 3.2 oz (64.5 kg)   SpO2 Readings from Last 3 Encounters:  11/30/24 99%  10/29/24 98%  09/26/24 98%      Physical Exam Vitals and nursing note reviewed.  Constitutional:      General: She is not in acute distress.    Appearance: Normal appearance. She is well-developed, well-groomed and normal weight. She is not ill-appearing, toxic-appearing or diaphoretic.  HENT:     Head: Normocephalic and atraumatic.     Jaw: There is normal jaw occlusion.     Right Ear: Hearing, tympanic membrane, ear canal and external ear normal.     Left Ear: Hearing, tympanic membrane, ear canal and external ear normal.     Nose: Nose normal.     Mouth/Throat:     Lips: Pink.     Mouth: Mucous membranes are moist.     Pharynx: Oropharynx is clear. Uvula midline.  Eyes:     General: Lids are normal.     Extraocular Movements: Extraocular movements intact.     Conjunctiva/sclera: Conjunctivae normal.     Pupils: Pupils are equal, round, and reactive to light.  Neck:     Thyroid : No  thyroid  mass, thyromegaly or thyroid  tenderness.     Vascular: No carotid bruit or JVD.     Trachea: Trachea and phonation normal.  Cardiovascular:     Rate and Rhythm: Normal rate and regular rhythm.     Chest Wall: PMI is not displaced.     Pulses:  Normal pulses.     Heart sounds: Normal heart sounds. No murmur heard.    No friction rub. No gallop.  Pulmonary:     Effort: Pulmonary effort is normal. No respiratory distress.     Breath sounds: Normal breath sounds. No wheezing.  Abdominal:     General: Bowel sounds are normal. There is no distension or abdominal bruit.     Palpations: Abdomen is soft. There is no hepatomegaly or splenomegaly.     Tenderness: There is no abdominal tenderness. There is no right CVA tenderness or left CVA tenderness.     Hernia: No hernia is present.  Musculoskeletal:        General: Normal range of motion.     Cervical back: Normal range of motion and neck supple.     Right lower leg: No edema.     Left lower leg: No edema.  Lymphadenopathy:     Cervical: No cervical adenopathy.  Skin:    General: Skin is warm and dry.     Capillary Refill: Capillary refill takes less than 2 seconds.     Coloration: Skin is not cyanotic, jaundiced or pale.     Findings: No rash.  Neurological:     General: No focal deficit present.     Mental Status: She is alert and oriented to person, place, and time.     Sensory: Sensation is intact.     Motor: Motor function is intact.     Coordination: Coordination is intact.     Gait: Gait is intact.     Deep Tendon Reflexes: Reflexes are normal and symmetric.  Psychiatric:        Attention and Perception: Attention and perception normal.        Mood and Affect: Mood and affect normal.        Speech: Speech normal.        Behavior: Behavior normal. Behavior is cooperative.        Thought Content: Thought content normal.        Cognition and Memory: Cognition and memory normal.        Judgment: Judgment normal.      No results found for any visits on 11/30/24. Last CBC Lab Results  Component Value Date   WBC 10.4 07/18/2024   HGB 15.1 07/18/2024   HCT 45.9 07/18/2024   MCV 101 (H) 07/18/2024   MCH 33.3 (H) 07/18/2024   RDW 11.8 07/18/2024   PLT 286  07/18/2024   Last metabolic panel Lab Results  Component Value Date   GLUCOSE 138 (H) 07/18/2024   NA 140 07/18/2024   K 4.2 07/18/2024   CL 103 07/18/2024   CO2 21 07/18/2024   BUN 16 07/18/2024   CREATININE 1.11 (H) 07/18/2024   EGFR 54 (L) 07/18/2024   CALCIUM 9.8 07/18/2024   PROT 6.5 07/18/2024   ALBUMIN 4.1 07/18/2024   LABGLOB 2.4 07/18/2024   AGRATIO 1.7 03/15/2023   BILITOT <0.2 07/18/2024   ALKPHOS 88 07/18/2024   AST 20 07/18/2024   ALT 17 07/18/2024   ANIONGAP 7 06/19/2021   Last lipids Lab Results  Component Value Date   CHOL 214 (H)  06/27/2024   HDL 42 06/27/2024   LDLCALC 141 (H) 06/27/2024   TRIG 172 (H) 06/27/2024   CHOLHDL 5.1 (H) 06/27/2024   Last hemoglobin A1c Lab Results  Component Value Date   HGBA1C 5.9 (H) 07/18/2024   Last thyroid  functions Lab Results  Component Value Date   TSH 1.820 07/18/2024   T4TOTAL 6.4 07/18/2024   Last vitamin D  Lab Results  Component Value Date   VD25OH 34.0 11/22/2023   Last vitamin B12 and Folate Lab Results  Component Value Date   VITAMINB12 587 11/22/2023   FOLATE 5.5 01/11/2020        Assessment & Plan:    Routine Health Maintenance and Physical Exam  Immunization History  Administered Date(s) Administered   Influenza Inj Mdck Quad Pf 10/21/2016, 09/14/2017   Influenza Split 08/29/2015, 09/13/2018   Influenza, Seasonal, Injecte, Preservative Fre 09/24/2011, 08/13/2013, 01/27/2015, 09/13/2018   Influenza,inj,Quad PF,6+ Mos 09/13/2018, 10/28/2019, 10/06/2020   Influenza,inj,quad, With Preservative 10/21/2016, 09/14/2017, 09/13/2018   Influenza,trivalent, recombinat, inj, PF 09/24/2011, 08/13/2013, 01/27/2015   Influenza-Unspecified 10/21/2016, 09/14/2017, 09/13/2018   PFIZER(Purple Top)SARS-COV-2 Vaccination 07/10/2020, 07/31/2020   PPD Test 11/12/2013, 01/27/2015   Pneumococcal Conjugate-13 07/15/2021   Tdap 12/06/2006, 10/21/2016    Health Maintenance  Topic Date Due   COVID-19  Vaccine (3 - Pfizer risk series) 12/16/2024 (Originally 08/28/2020)   Zoster Vaccines- Shingrix (1 of 2) 02/28/2025 (Originally 05/12/1974)   Influenza Vaccine  03/05/2025 (Originally 07/06/2024)   Lung Cancer Screening  06/27/2025 (Originally 05/12/2005)   Bone Density Scan  06/27/2025 (Originally 01/07/2023)   Pneumococcal Vaccine: 50+ Years (2 of 2 - PPSV23, PCV20, or PCV21) 11/30/2025 (Originally 09/09/2021)   Mammogram  11/30/2025 (Originally 01/31/2016)   Medicare Annual Wellness (AWV)  02/06/2025   Colonoscopy  06/23/2026   DTaP/Tdap/Td (3 - Td or Tdap) 10/21/2026   Hepatitis C Screening  Completed   Meningococcal B Vaccine  Aged Out   Fecal DNA (Cologuard)  Discontinued    Discussed health benefits of physical activity, and encouraged her to engage in regular exercise appropriate for her age and condition.  Problem List Items Addressed This Visit       Cardiovascular and Mediastinum   Primary hypertension   Relevant Medications   ezetimibe  (ZETIA ) 10 MG tablet   hydrochlorothiazide  (HYDRODIURIL ) 25 MG tablet   EPINEPHrine  0.3 mg/0.3 mL IJ SOAJ injection   Other Relevant Orders   CBC with Differential/Platelet   CMP14+EGFR   Lipid panel   TSH   T4, free   Bayer DCA Hb A1c Waived   Aortic atherosclerosis   Relevant Medications   ezetimibe  (ZETIA ) 10 MG tablet   hydrochlorothiazide  (HYDRODIURIL ) 25 MG tablet   EPINEPHrine  0.3 mg/0.3 mL IJ SOAJ injection   Other Relevant Orders   CBC with Differential/Platelet   CMP14+EGFR   Lipid panel   Persistent atrial fibrillation (HCC)   Relevant Medications   ezetimibe  (ZETIA ) 10 MG tablet   hydrochlorothiazide  (HYDRODIURIL ) 25 MG tablet   EPINEPHrine  0.3 mg/0.3 mL IJ SOAJ injection   Other Relevant Orders   CBC with Differential/Platelet   CMP14+EGFR   Lipid panel   TSH   T4, free   Bayer DCA Hb A1c Waived     Respiratory   Non-seasonal allergic rhinitis   Relevant Medications   levocetirizine (XYZAL ) 5 MG tablet     Other    Chronic left shoulder pain   Relevant Medications   traMADol  (ULTRAM ) 50 MG tablet   traMADol  (ULTRAM ) 50 MG tablet (Start on  12/30/2024)   traMADol  (ULTRAM ) 50 MG tablet (Start on 01/29/2025)   Other Relevant Orders   CBC with Differential/Platelet   CMP14+EGFR   VITAMIN D  25 Hydroxy (Vit-D Deficiency, Fractures)   Chronic pain of right knee   Relevant Medications   traMADol  (ULTRAM ) 50 MG tablet   traMADol  (ULTRAM ) 50 MG tablet (Start on 12/30/2024)   traMADol  (ULTRAM ) 50 MG tablet (Start on 01/29/2025)   Other Relevant Orders   CBC with Differential/Platelet   CMP14+EGFR   VITAMIN D  25 Hydroxy (Vit-D Deficiency, Fractures)   GAD (generalized anxiety disorder)   Relevant Medications   busPIRone  (BUSPAR ) 15 MG tablet   Other Relevant Orders   CBC with Differential/Platelet   CMP14+EGFR   TSH   T4, free   Hyperlipidemia   Relevant Medications   ezetimibe  (ZETIA ) 10 MG tablet   hydrochlorothiazide  (HYDRODIURIL ) 25 MG tablet   EPINEPHrine  0.3 mg/0.3 mL IJ SOAJ injection   Other Relevant Orders   CBC with Differential/Platelet   CMP14+EGFR   Lipid panel   Bayer DCA Hb A1c Waived   Chronic right hip pain   Relevant Medications   traMADol  (ULTRAM ) 50 MG tablet   traMADol  (ULTRAM ) 50 MG tablet (Start on 12/30/2024)   traMADol  (ULTRAM ) 50 MG tablet (Start on 01/29/2025)   Other Relevant Orders   CBC with Differential/Platelet   CMP14+EGFR   VITAMIN D  25 Hydroxy (Vit-D Deficiency, Fractures)   Bee sting allergy   Relevant Medications   EPINEPHrine  0.3 mg/0.3 mL IJ SOAJ injection   Other Visit Diagnoses       Annual physical exam    -  Primary   Relevant Orders   CBC with Differential/Platelet   CMP14+EGFR   Lipid panel     Inflamed external hemorrhoid       Relevant Medications   ezetimibe  (ZETIA ) 10 MG tablet   hydrochlorothiazide  (HYDRODIURIL ) 25 MG tablet   EPINEPHrine  0.3 mg/0.3 mL IJ SOAJ injection   Other Relevant Orders   CBC with Differential/Platelet          Inflamed external hemorrhoid Chronic inflamed external hemorrhoid causing significant discomfort. Previous attempts to obtain lidocaine  and hydrocortisone  were unsuccessful due to cost and insurance coverage issues. Currently using witch hazel for symptomatic relief. No bleeding reported. Surgery may be considered due to persistent symptoms. - Continue using witch hazel for symptomatic relief. - Follow up with GI specialist on January 19th for potential surgical intervention.  Chronic constipation Daily occurrence. Currently using MiraLax powder once daily, but advised to increase to two capfuls at one time to improve efficacy. Tramadol  use may contribute to constipation. - Increase MiraLax to two capfuls at one time daily.  Mixed hyperlipidemia Managed with Zetia , which is well tolerated. Pravastatin  was not tolerated and is not being taken. - Continue Zetia  as prescribed.  Primary hypertension Managed with diltiazem . Cardiologist advised to increase dose if symptoms persist. - Continue current antihypertensive regimen with diltiazem . - Follow up with cardiologist for potential dose adjustment.  General Health Maintenance Colonoscopy not due until 2027. Mammogram and lung cancer screening declined. Bone density test not performed due to lack of x-ray availability. - Will schedule colonoscopy in 2027.       Return in about 6 months (around 05/31/2025) for prediabetes.     Rosaline Bruns, FNP      [1]  Social History Tobacco Use   Smoking status: Every Day    Current packs/day: 1.00    Average packs/day: 1 pack/day for 30.0 years (  30.0 ttl pk-yrs)    Types: Cigarettes   Smokeless tobacco: Never  Vaping Use   Vaping status: Never Used  Substance Use Topics   Alcohol use: Never   Drug use: Never  [2]  Allergies Allergen Reactions   Bee Venom Anaphylaxis   Codeine Anaphylaxis   Statins Other (See Comments)    Muscle aches   Anusol -Hc [Hydrocortisone ]      Rash/itching, all hemorrhoid creams  [3]  Outpatient Medications Prior to Visit  Medication Sig   albuterol  (VENTOLIN  HFA) 108 (90 Base) MCG/ACT inhaler Inhale 2 puffs into the lungs every 6 (six) hours as needed for wheezing or shortness of breath.   Calcium-Magnesium-Zinc (CAL-MAG-ZINC PO) Take 1 tablet by mouth daily.   cholecalciferol (VITAMIN D3) 25 MCG (1000 UNIT) tablet Take 2,000 Units by mouth daily.   dicyclomine  (BENTYL ) 20 MG tablet TAKE 1 TABLET THREE TIMES DAILY   diltiazem  (CARDIZEM  CD) 120 MG 24 hr capsule TAKE 1 CAPSULE BY MOUTH EVERY DAY   fluticasone  (FLONASE ) 50 MCG/ACT nasal spray Place 2 sprays into both nostrils daily.   hydrocortisone  (ANUSOL -HC) 2.5 % rectal cream Place 1 Application rectally 2 (two) times daily.   linaclotide  (LINZESS ) 145 MCG CAPS capsule Take 1 capsule (145 mcg total) by mouth daily. To regulate bowel movements   methocarbamol  (ROBAXIN ) 500 MG tablet Take 1 tablet (500 mg total) by mouth every 8 (eight) hours as needed for muscle spasms.   metoprolol  succinate (TOPROL -XL) 25 MG 24 hr tablet Take 1 tablet (25 mg total) by mouth daily.   mupirocin  ointment (BACTROBAN ) 2 % APPLY 1 APPLICATION TOPICALLY 2 (TWO) TIMES DAILY.   ondansetron  (ZOFRAN ) 4 MG tablet Take 1 tablet (4 mg total) by mouth every 8 (eight) hours as needed for nausea or vomiting.   pantoprazole  (PROTONIX ) 40 MG tablet TAKE 1 TABLET EVERY DAY   pyridOXINE (VITAMIN B6) 100 MG tablet Take 200 mg by mouth daily.   rivaroxaban  (XARELTO ) 20 MG TABS tablet Take 1 tablet (20 mg total) by mouth daily with supper.   triamcinolone  cream (KENALOG ) 0.1 % APPLY TOPICALLY TWO TIMES DAILY AS NEEDED   [DISCONTINUED] busPIRone  (BUSPAR ) 15 MG tablet TAKE 1 TABLET THREE TIMES DAILY   [DISCONTINUED] EPINEPHrine  0.3 mg/0.3 mL IJ SOAJ injection Inject 0.3 mg into the muscle as needed for anaphylaxis.   [DISCONTINUED] ezetimibe  (ZETIA ) 10 MG tablet Take 1 tablet (10 mg total) by mouth daily.   [DISCONTINUED]  hydrochlorothiazide  (HYDRODIURIL ) 25 MG tablet Take 1 tablet (25 mg total) by mouth daily.   [DISCONTINUED] levocetirizine (XYZAL ) 5 MG tablet Take 1 tablet (5 mg total) by mouth every evening.   [DISCONTINUED] lidocaine  (XYLOCAINE ) 2 % jelly Apply 1 Application topically as needed (hemorrhoid pain).   [DISCONTINUED] pravastatin  (PRAVACHOL ) 10 MG tablet Take 1 tablet (10 mg total) by mouth daily.   [DISCONTINUED] traMADol  (ULTRAM ) 50 MG tablet Take 1 tablet (50 mg total) by mouth 2 (two) times daily.   [DISCONTINUED] ciprofloxacin  (CIPRO ) 500 MG tablet Take 1 tablet (500 mg total) by mouth 2 (two) times daily.   [DISCONTINUED] metroNIDAZOLE  (FLAGYL ) 500 MG tablet Take 1 tablet (500 mg total) by mouth 2 (two) times daily.   No facility-administered medications prior to visit.   "

## 2024-12-01 LAB — CBC WITH DIFFERENTIAL/PLATELET
Basophils Absolute: 0 x10E3/uL (ref 0.0–0.2)
Basos: 0 %
EOS (ABSOLUTE): 0.1 x10E3/uL (ref 0.0–0.4)
Eos: 1 %
Hematocrit: 44.2 % (ref 34.0–46.6)
Hemoglobin: 14.9 g/dL (ref 11.1–15.9)
Immature Grans (Abs): 0 x10E3/uL (ref 0.0–0.1)
Immature Granulocytes: 0 %
Lymphocytes Absolute: 3.2 x10E3/uL — ABNORMAL HIGH (ref 0.7–3.1)
Lymphs: 35 %
MCH: 33.7 pg — ABNORMAL HIGH (ref 26.6–33.0)
MCHC: 33.7 g/dL (ref 31.5–35.7)
MCV: 100 fL — ABNORMAL HIGH (ref 79–97)
Monocytes Absolute: 1 x10E3/uL — ABNORMAL HIGH (ref 0.1–0.9)
Monocytes: 11 %
Neutrophils Absolute: 4.8 x10E3/uL (ref 1.4–7.0)
Neutrophils: 53 %
Platelets: 250 x10E3/uL (ref 150–450)
RBC: 4.42 x10E6/uL (ref 3.77–5.28)
RDW: 11.6 % — ABNORMAL LOW (ref 11.7–15.4)
WBC: 9.2 x10E3/uL (ref 3.4–10.8)

## 2024-12-01 LAB — CMP14+EGFR
ALT: 11 IU/L (ref 0–32)
AST: 17 IU/L (ref 0–40)
Albumin: 4.1 g/dL (ref 3.9–4.9)
Alkaline Phosphatase: 72 IU/L (ref 49–135)
BUN/Creatinine Ratio: 19 (ref 12–28)
BUN: 17 mg/dL (ref 8–27)
Bilirubin Total: 0.3 mg/dL (ref 0.0–1.2)
CO2: 20 mmol/L (ref 20–29)
Calcium: 10 mg/dL (ref 8.7–10.3)
Chloride: 102 mmol/L (ref 96–106)
Creatinine, Ser: 0.88 mg/dL (ref 0.57–1.00)
Globulin, Total: 2.4 g/dL (ref 1.5–4.5)
Glucose: 98 mg/dL (ref 70–99)
Potassium: 4.8 mmol/L (ref 3.5–5.2)
Sodium: 138 mmol/L (ref 134–144)
Total Protein: 6.5 g/dL (ref 6.0–8.5)
eGFR: 71 mL/min/1.73

## 2024-12-01 LAB — T4, FREE: Free T4: 1.07 ng/dL (ref 0.82–1.77)

## 2024-12-01 LAB — LIPID PANEL
Chol/HDL Ratio: 4.2 ratio (ref 0.0–4.4)
Cholesterol, Total: 183 mg/dL (ref 100–199)
HDL: 44 mg/dL
LDL Chol Calc (NIH): 118 mg/dL — ABNORMAL HIGH (ref 0–99)
Triglycerides: 116 mg/dL (ref 0–149)
VLDL Cholesterol Cal: 21 mg/dL (ref 5–40)

## 2024-12-01 LAB — TSH: TSH: 1.58 u[IU]/mL (ref 0.450–4.500)

## 2024-12-01 LAB — VITAMIN D 25 HYDROXY (VIT D DEFICIENCY, FRACTURES): Vit D, 25-Hydroxy: 27.7 ng/mL — ABNORMAL LOW (ref 30.0–100.0)

## 2024-12-10 ENCOUNTER — Telehealth: Payer: Self-pay | Admitting: Family Medicine

## 2024-12-10 NOTE — Telephone Encounter (Unsigned)
 Copied from CRM #[id number]. Topic: Clinical - Refused Triage >> Dec 10, 2024 10:26 AM Alfonso ORN wrote: Patient/caller voiced complaints of requesting to speak with Rock Bruns nurse Harlene  patient requesting antibotics reason having  Diverticulitis  and hemorroids  rate pain 4 to 5 with spasm and consipation  . Declined transfer to triage.   ----------------------------------------------------------------------- From previous Reason for Contact - Pink Word Triage: Pink Word triggered transfer to Nurse Triage. See Triage Message for details.

## 2024-12-12 ENCOUNTER — Ambulatory Visit: Admitting: Gastroenterology

## 2024-12-13 ENCOUNTER — Telehealth: Payer: Self-pay | Admitting: Family Medicine

## 2024-12-13 ENCOUNTER — Encounter: Payer: Self-pay | Admitting: Gastroenterology

## 2024-12-13 NOTE — Telephone Encounter (Signed)
 Copied from CRM [id number]. Topic: Clinical - Medical Advice >> Dec 12, 2024  4:15 PM Sherry Salinas wrote: Reason for CRM: patient [employer] insurance need a form completed by her provider about patient condition, patient was not sure about what form insurance referring to , Patient mention about her blood regarding a referral  Please fax the form over to [facility]   Please contact patient for further question or information

## 2024-12-13 NOTE — Telephone Encounter (Signed)
 Attempted to contact patient - NA  When patient calls back get her to have form faxed over.  Not sure what patient is referring to.

## 2024-12-14 ENCOUNTER — Other Ambulatory Visit: Payer: Self-pay | Admitting: Family Medicine

## 2024-12-14 DIAGNOSIS — H60542 Acute eczematoid otitis externa, left ear: Secondary | ICD-10-CM

## 2024-12-14 NOTE — Telephone Encounter (Signed)
 Contacted patient. She said she meant to call us  back.  She says she called insurance back and they need the form from cardiologist.

## 2024-12-22 ENCOUNTER — Other Ambulatory Visit: Payer: Self-pay | Admitting: Family Medicine

## 2024-12-22 DIAGNOSIS — J301 Allergic rhinitis due to pollen: Secondary | ICD-10-CM

## 2025-01-11 ENCOUNTER — Telehealth: Payer: Self-pay | Admitting: Family Medicine

## 2025-01-11 NOTE — Telephone Encounter (Signed)
 Copied from CRM #[id number]. Topic: General - Other >> Jan 10, 2025  3:53 PM Santiya F wrote: Reason for CRM: [facility] is calling in because they need verification of patient's chronic conditions so they can continue to receive their insurance. Reference number: [id number]

## 2025-02-04 ENCOUNTER — Ambulatory Visit: Admitting: Gastroenterology

## 2025-02-07 ENCOUNTER — Ambulatory Visit: Payer: Self-pay

## 2025-06-04 ENCOUNTER — Ambulatory Visit: Admitting: Family Medicine
